# Patient Record
Sex: Female | Born: 1955
Health system: Southern US, Community
[De-identification: ages and names within clinical notes are randomized; demographics above are authoritative.]

## PROBLEM LIST (undated history)

## (undated) DIAGNOSIS — M25511 Pain in right shoulder: Secondary | ICD-10-CM

## (undated) DIAGNOSIS — M810 Age-related osteoporosis without current pathological fracture: Secondary | ICD-10-CM

## (undated) DIAGNOSIS — M8580 Other specified disorders of bone density and structure, unspecified site: Secondary | ICD-10-CM

## (undated) DIAGNOSIS — R002 Palpitations: Secondary | ICD-10-CM

## (undated) DIAGNOSIS — Z0000 Encounter for general adult medical examination without abnormal findings: Secondary | ICD-10-CM

## (undated) DIAGNOSIS — Z8619 Personal history of other infectious and parasitic diseases: Secondary | ICD-10-CM

## (undated) DIAGNOSIS — Z9889 Other specified postprocedural states: Secondary | ICD-10-CM

## (undated) DIAGNOSIS — K635 Polyp of colon: Secondary | ICD-10-CM

## (undated) DIAGNOSIS — F419 Anxiety disorder, unspecified: Secondary | ICD-10-CM

## (undated) DIAGNOSIS — R112 Nausea with vomiting, unspecified: Secondary | ICD-10-CM

## (undated) DIAGNOSIS — N809 Endometriosis, unspecified: Secondary | ICD-10-CM

## (undated) DIAGNOSIS — C44310 Basal cell carcinoma of skin of unspecified parts of face: Principal | ICD-10-CM

## (undated) DIAGNOSIS — D649 Anemia, unspecified: Secondary | ICD-10-CM

## (undated) HISTORY — DX: Anxiety disorder, unspecified: F41.9

## (undated) HISTORY — DX: Encounter for general adult medical examination without abnormal findings: Z00.00

## (undated) HISTORY — DX: Basal cell carcinoma of skin of unspecified parts of face: C44.310

## (undated) HISTORY — DX: Pain in right shoulder: M25.511

## (undated) HISTORY — PX: BASAL CELL CARCINOMA EXCISION: SHX1214

## (undated) HISTORY — PX: LIVER RESECTION: SHX1977

## (undated) HISTORY — DX: Endometriosis, unspecified: N80.9

## (undated) HISTORY — DX: Anemia, unspecified: D64.9

## (undated) HISTORY — DX: Age-related osteoporosis without current pathological fracture: M81.0

## (undated) HISTORY — DX: Personal history of other infectious and parasitic diseases: Z86.19

## (undated) HISTORY — PX: COLONOSCOPY: SHX174

## (undated) HISTORY — DX: Palpitations: R00.2

## (undated) HISTORY — DX: Other specified disorders of bone density and structure, unspecified site: M85.80

## (undated) HISTORY — DX: Polyp of colon: K63.5

---

## 1978-08-14 HISTORY — PX: CHOLECYSTECTOMY: SHX55

## 1979-08-15 DIAGNOSIS — K8020 Calculus of gallbladder without cholecystitis without obstruction: Secondary | ICD-10-CM

## 1979-08-15 HISTORY — DX: Calculus of gallbladder without cholecystitis without obstruction: K80.20

## 1983-08-15 HISTORY — PX: LAPAROSCOPY: SHX197

## 1998-11-15 ENCOUNTER — Other Ambulatory Visit: Admission: RE | Admit: 1998-11-15 | Discharge: 1998-11-15 | Payer: Self-pay | Admitting: Gynecology

## 2001-08-20 ENCOUNTER — Other Ambulatory Visit: Admission: RE | Admit: 2001-08-20 | Discharge: 2001-08-20 | Payer: Self-pay | Admitting: Gynecology

## 2002-12-24 ENCOUNTER — Other Ambulatory Visit: Admission: RE | Admit: 2002-12-24 | Discharge: 2002-12-24 | Payer: Self-pay | Admitting: Gynecology

## 2004-02-25 ENCOUNTER — Other Ambulatory Visit: Admission: RE | Admit: 2004-02-25 | Discharge: 2004-02-25 | Payer: Self-pay | Admitting: Gynecology

## 2005-05-17 ENCOUNTER — Other Ambulatory Visit: Admission: RE | Admit: 2005-05-17 | Discharge: 2005-05-17 | Payer: Self-pay | Admitting: Gynecology

## 2007-03-11 ENCOUNTER — Other Ambulatory Visit: Admission: RE | Admit: 2007-03-11 | Discharge: 2007-03-11 | Payer: Self-pay | Admitting: Gynecology

## 2012-01-23 ENCOUNTER — Encounter: Payer: Self-pay | Admitting: Internal Medicine

## 2012-02-13 ENCOUNTER — Other Ambulatory Visit: Payer: Self-pay | Admitting: Gynecology

## 2012-02-13 DIAGNOSIS — R928 Other abnormal and inconclusive findings on diagnostic imaging of breast: Secondary | ICD-10-CM

## 2012-02-22 ENCOUNTER — Ambulatory Visit
Admission: RE | Admit: 2012-02-22 | Discharge: 2012-02-22 | Disposition: A | Discharge status: Home / Self Care | Payer: BC Managed Care – PPO | Source: Ambulatory Visit | Attending: Gynecology | Admitting: Gynecology

## 2012-02-22 DIAGNOSIS — R928 Other abnormal and inconclusive findings on diagnostic imaging of breast: Secondary | ICD-10-CM

## 2012-03-06 ENCOUNTER — Encounter: Payer: Self-pay | Admitting: Internal Medicine

## 2012-07-03 ENCOUNTER — Encounter: Payer: Self-pay | Admitting: Internal Medicine

## 2012-07-24 ENCOUNTER — Encounter: Payer: BC Managed Care – PPO | Admitting: Internal Medicine

## 2013-06-18 ENCOUNTER — Encounter: Payer: Self-pay | Admitting: Internal Medicine

## 2013-07-15 IMAGING — MG MM DIGITAL DIAGNOSTIC UNILAT*R*
2 series · 2 of 2 positions shown · non-contrast
Comparison: 07/01/2008,  12/08/2009, 02/07/2012

CLINICAL DATA: Further evaluation of right asymmetry

DIGITAL DIAGNOSTIC RIGHT MAMMOGRAM

[R CC]
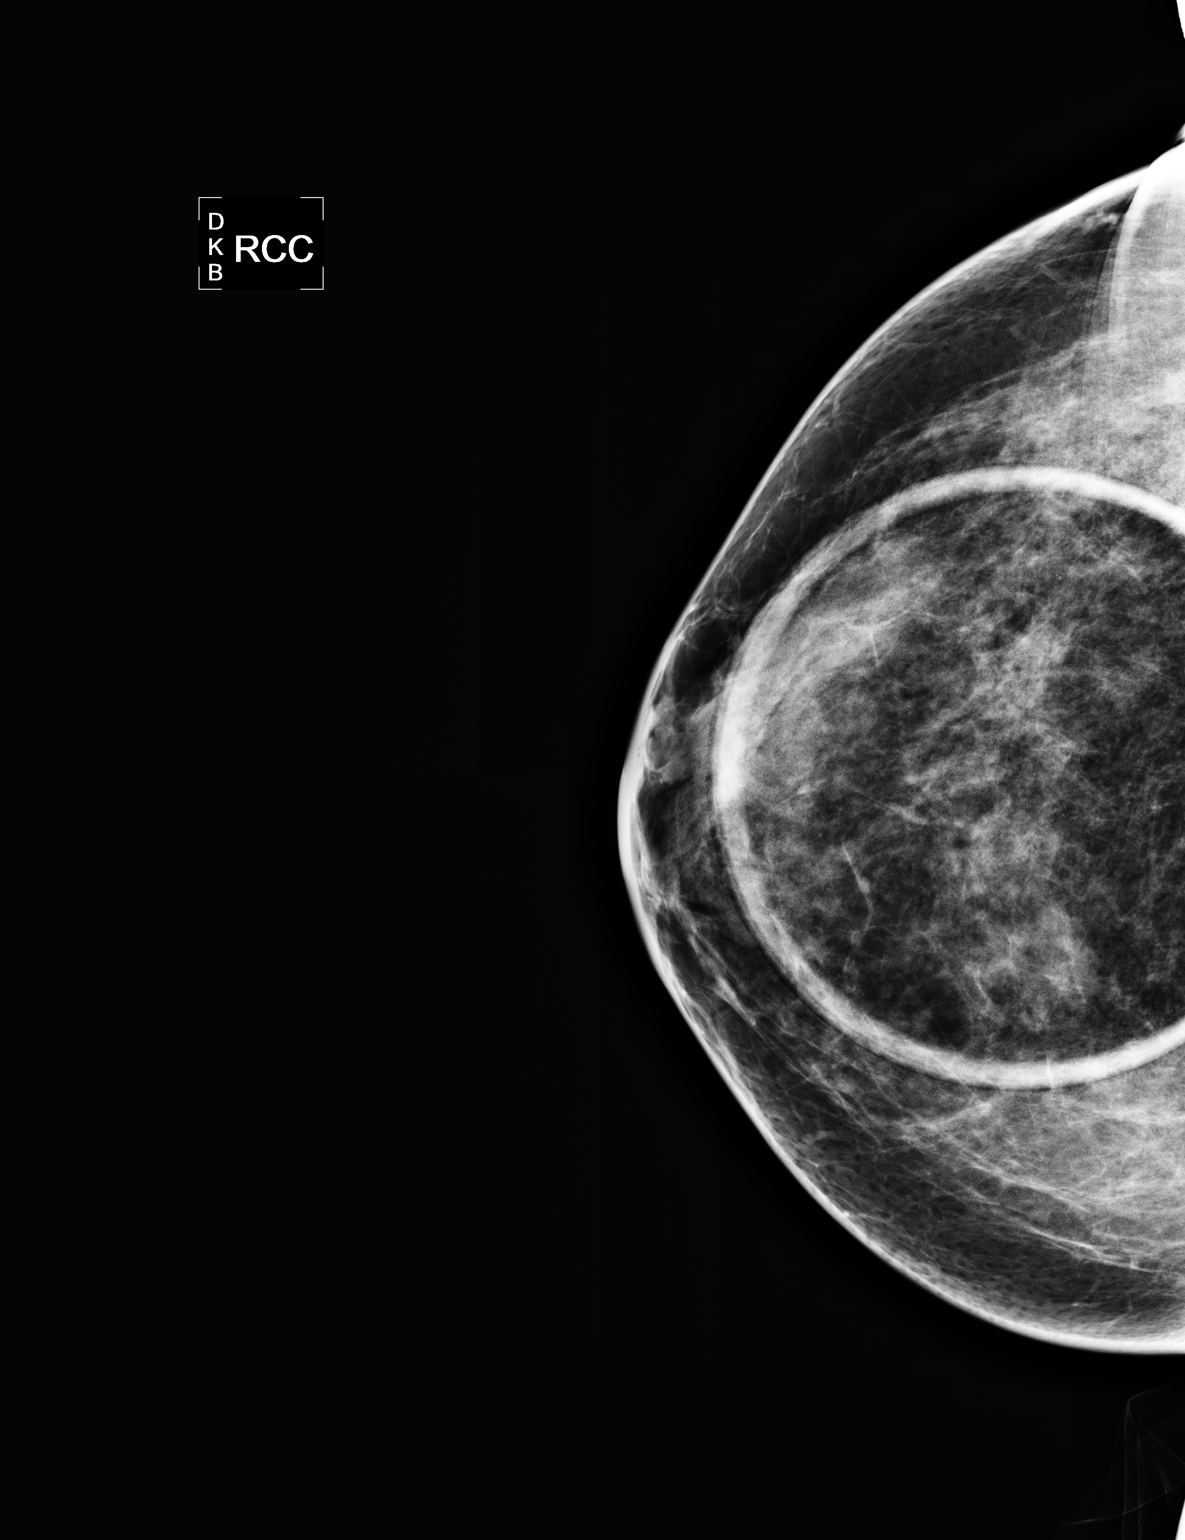

[R MLO]
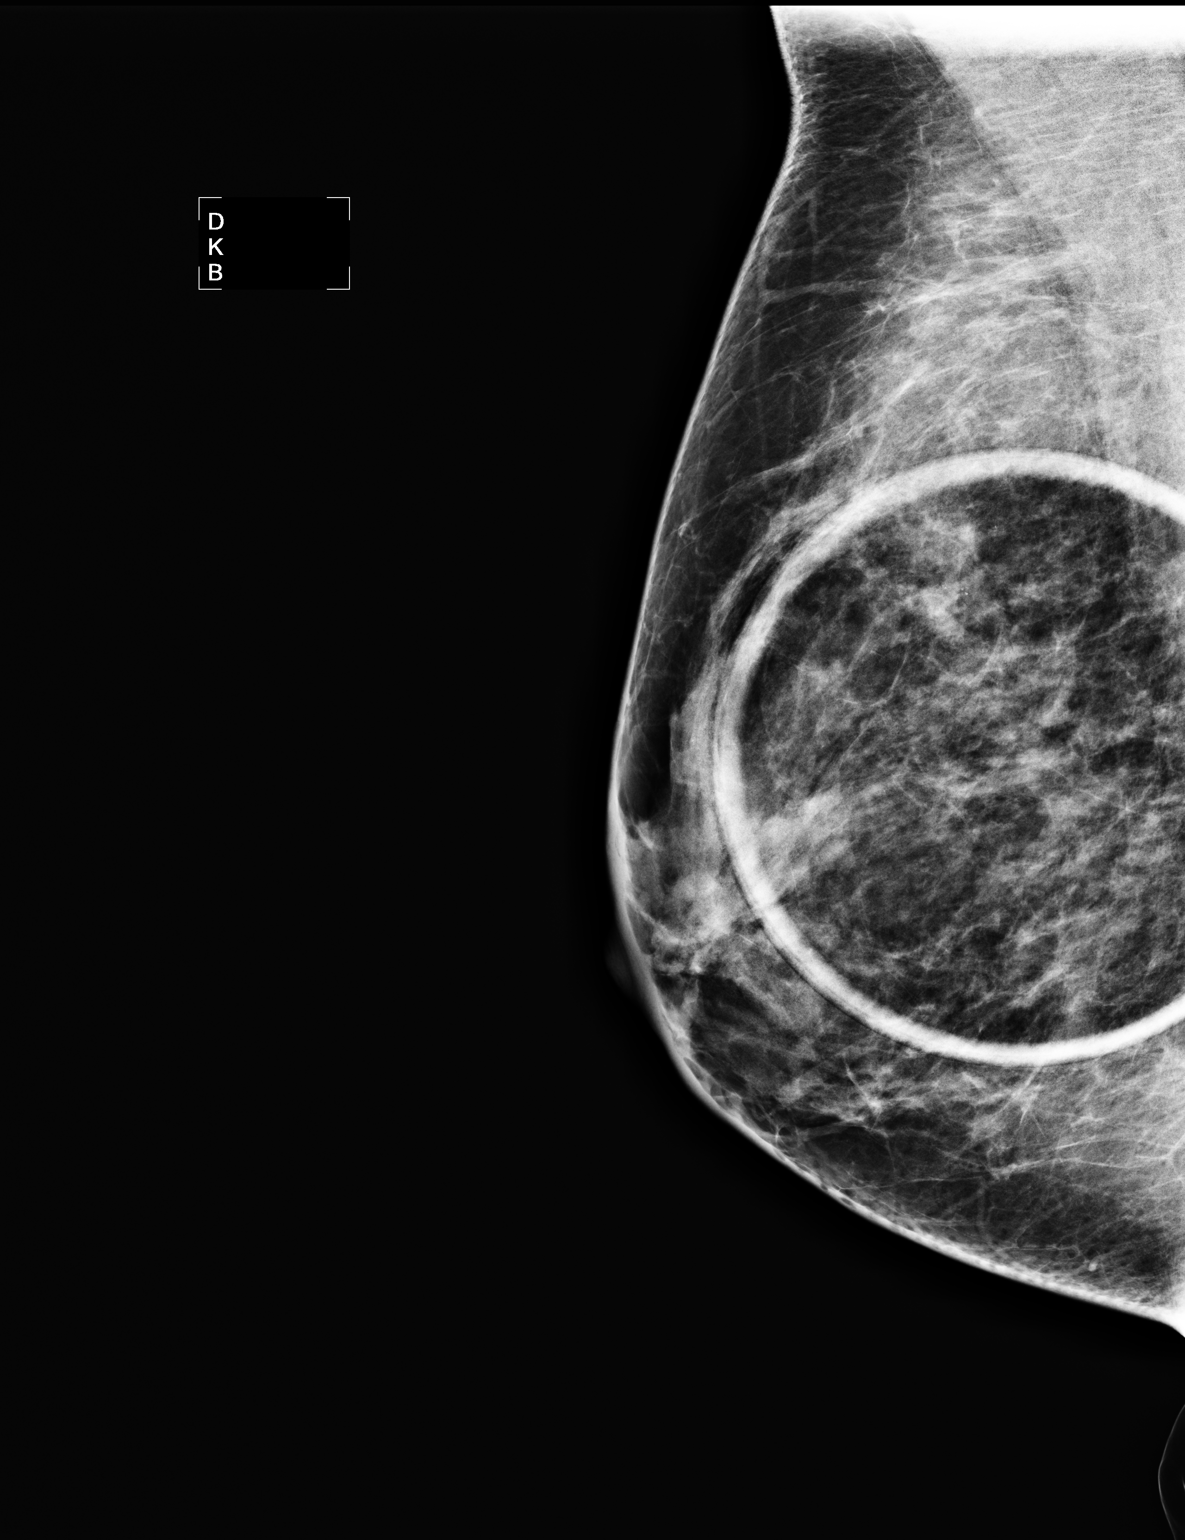

[2 of 2 positions shown; findings below may reference images not displayed]

FINDINGS: No persistent abnormalities on spot compression views.
Loosely clustered punctate calcifications upper outer quadrant
posteriorly seen incidentally, completely stable when compared to
all prior studies.  Normal
Mammographic images were processed with CAD.
IMPRESSION: No evidence of mass.  Stable benign calcifications.

BI-RADS CATEGORY 2:  Benign finding(s).

RECOMMENDATION:
Recommendation:

Return to annual screening mammography.

## 2013-08-27 ENCOUNTER — Encounter: Payer: Self-pay | Admitting: Internal Medicine

## 2013-08-27 ENCOUNTER — Ambulatory Visit (AMBULATORY_SURGERY_CENTER): Payer: Self-pay | Admitting: *Deleted

## 2013-08-27 VITALS — Ht 64.0 in | Wt 158.2 lb

## 2013-08-27 DIAGNOSIS — Z1211 Encounter for screening for malignant neoplasm of colon: Secondary | ICD-10-CM

## 2013-08-27 MED ORDER — MOVIPREP 100 G PO SOLR: 1.0000 | Freq: Once | ORAL | Status: DC

## 2013-08-27 NOTE — Progress Notes (Signed)
Denies allergies to eggs or soy products. Denies complications with sedation or anesthesia. 

## 2013-09-10 ENCOUNTER — Encounter: Payer: Self-pay | Admitting: Internal Medicine

## 2013-09-10 ENCOUNTER — Ambulatory Visit (AMBULATORY_SURGERY_CENTER): Payer: BC Managed Care – PPO | Admitting: Internal Medicine

## 2013-09-10 VITALS — BP 112/70 | HR 59 | Temp 96.0°F | Resp 13 | Ht 64.0 in | Wt 158.0 lb

## 2013-09-10 DIAGNOSIS — Z1211 Encounter for screening for malignant neoplasm of colon: Secondary | ICD-10-CM

## 2013-09-10 DIAGNOSIS — D126 Benign neoplasm of colon, unspecified: Secondary | ICD-10-CM

## 2013-09-10 MED ORDER — SODIUM CHLORIDE 0.9 % IV SOLN: 500.0000 mL | INTRAVENOUS | Status: DC

## 2013-09-10 NOTE — Patient Instructions (Signed)
YOU HAD AN ENDOSCOPIC PROCEDURE TODAY AT THE [facility]: Refer to the procedure report that was given to you for any specific questions about what was found during the examination.  If the procedure report does not answer your questions, please call your gastroenterologist to clarify.  If you requested that your care partner not be given the details of your procedure findings, then the procedure report has been included in a sealed envelope for you to review at your convenience later.  YOU SHOULD EXPECT: Some feelings of bloating in the abdomen. Passage of more gas than usual.  Walking can help get rid of the air that was put into your GI tract during the procedure and reduce the bloating. If you had a lower endoscopy (such as a colonoscopy or flexible sigmoidoscopy) you may notice spotting of blood in your stool or on the toilet paper. If you underwent a bowel prep for your procedure, then you may not have a normal bowel movement for a few days.  DIET: Your first meal following the procedure should be a light meal and then it is ok to progress to your normal diet.  A half-sandwich or bowl of soup is an example of a good first meal.  Heavy or fried foods are harder to digest and may make you feel nauseous or bloated.  Likewise meals heavy in dairy and vegetables can cause extra gas to form and this can also increase the bloating.  Drink plenty of fluids but you should avoid alcoholic beverages for 24 hours.  ACTIVITY: Your care partner should take you home directly after the procedure.  You should plan to take it easy, moving slowly for the rest of the day.  You can resume normal activity the day after the procedure however you should NOT DRIVE or use heavy machinery for 24 hours (because of the sedation medicines used during the test).    SYMPTOMS TO REPORT IMMEDIATELY: A gastroenterologist can be reached at any hour.  During normal business hours, 8:30 AM to 5:00 PM Monday through Friday,  call [phone].  After hours and on weekends, please call the GI answering service at [phone] who will take a message and have the physician on call contact you.   Following lower endoscopy (colonoscopy or flexible sigmoidoscopy):  Excessive amounts of blood in the stool  Significant tenderness or worsening of abdominal pains  Swelling of the abdomen that is new, acute  Fever of 100F or higher    FOLLOW UP: If any biopsies were taken you will be contacted by phone or by letter within the next 1-3 weeks.  Call your gastroenterologist if you have not heard about the biopsies in 3 weeks.  Our staff will call the home number listed on your records the next business day following your procedure to check on you and address any questions or concerns that you may have at that time regarding the information given to you following your procedure. This is a courtesy call and so if there is no answer at the home number and we have not heard from you through the emergency physician on call, we will assume that you have returned to your regular daily activities without incident.  SIGNATURES/CONFIDENTIALITY: You and/or your care partner have signed paperwork which will be entered into your electronic medical record.  These signatures attest to the fact that that the information above on your After Visit Summary has been reviewed and is understood.  Full responsibility of the confidentiality   of this discharge information lies with you and/or your care-partner.  Polyp, diverticulosis, and high fiber diet information given.  Dr. Olevia Perches will advise you about timing of next colonoscopy after pathology reports are reviewed.

## 2013-09-10 NOTE — Progress Notes (Signed)
Stable to RR 

## 2013-09-10 NOTE — Progress Notes (Signed)
Called to room to assist during endoscopic procedure.  Patient ID and intended procedure confirmed with present staff. Received instructions for my participation in the procedure from the performing physician.  

## 2013-09-10 NOTE — Op Note (Signed)
[facility]  [address] [place], [id number]   COLONOSCOPY PROCEDURE REPORT  PATIENT: Joan Owen, Joan Owen  MR#: [id number] BIRTHDATE: 09-Jul-1956 , 57  yrs. old GENDER: Female ENDOSCOPIST: Lafayette Dragon, MD REFERRED IO:NGEXBM Mezer, M.D. PROCEDURE DATE:  09/10/2013 PROCEDURE:   Colonoscopy with snare polypectomy First Screening Colonoscopy - Avg.  risk and is 50 yrs.  old or older Yes.  Prior Negative Screening - Now for repeat screening. N/A  History of Adenoma - Now for follow-up colonoscopy & has been > or = to 3 yrs.  N/A  Polyps Removed Today? Yes. ASA CLASS:   Class I INDICATIONS:Average risk patient for colon cancer. MEDICATIONS: propofol (Diprivan) 350mg  IV  DESCRIPTION OF PROCEDURE:   After the risks benefits and alternatives of the procedure were thoroughly explained, informed consent was obtained.  A digital rectal exam revealed no abnormalities of the rectum.   The LB [id number]  endoscope was introduced through the anus and advanced to the cecum, which was identified by both the appendix and ileocecal valve. No adverse events experienced.   The quality of the prep was excellent, using MoviPrep  The instrument was then slowly withdrawn as the colon was fully examined.      COLON FINDINGS: A polypoid shaped sessile polyp ranging between 3-60mm in size was found in the sigmoid colon.  A polypectomy was performed with a cold snare.  The resection was complete and the polyp tissue was completely retrieved.  Retroflexed views revealed no abnormalities. The time to cecum=6 minutes 39 seconds. Withdrawal time=10 minutes 46 seconds.  The scope was withdrawn and the procedure completed. COMPLICATIONS: There were no complications.  ENDOSCOPIC IMPRESSION: Sessile polyp ranging between 3-78mm in size was found in the sigmoid colon; polypectomy was performed with a cold snare mild diverticulosis of the sigmoid colon RECOMMENDATIONS: 1.  Await pathology  results 2.  high-fiber diet Recall colonoscopy pending path report   eSigned:  Lafayette Dragon, MD 09/10/2013 9:58 AM   cc:   PATIENT NAME:  Joan Owen, Joan Owen MR#: [id number]

## 2013-09-11 ENCOUNTER — Telehealth: Payer: Self-pay | Admitting: *Deleted

## 2013-09-11 NOTE — Telephone Encounter (Signed)
  Follow up Call-  Call back number 09/10/2013  Post procedure Call Back phone  # [phone]  Permission to leave phone message Yes     Patient questions:  Do you have a fever, pain , or abdominal swelling? no Pain Score  0 *  Have you tolerated food without any problems? yes  Have you been able to return to your normal activities? yes  Do you have any questions about your discharge instructions: Diet   no Medications  no Follow up visit  no  Do you have questions or concerns about your Care? no  Actions: * If pain score is 4 or above: No action needed, pain <4.

## 2013-09-16 ENCOUNTER — Encounter: Payer: Self-pay | Admitting: Internal Medicine

## 2013-09-17 ENCOUNTER — Encounter: Payer: Self-pay | Admitting: *Deleted

## 2014-10-21 ENCOUNTER — Other Ambulatory Visit: Payer: Self-pay | Admitting: Gynecology

## 2014-10-22 LAB — CYTOLOGY - PAP

## 2015-01-05 ENCOUNTER — Ambulatory Visit (INDEPENDENT_AMBULATORY_CARE_PROVIDER_SITE_OTHER): Payer: BLUE CROSS/BLUE SHIELD | Admitting: Family Medicine

## 2015-01-05 ENCOUNTER — Encounter: Payer: Self-pay | Admitting: Family Medicine

## 2015-01-05 VITALS — BP 108/70 | HR 71 | Temp 98.1°F | Resp 16 | Ht 65.0 in | Wt 153.0 lb

## 2015-01-05 DIAGNOSIS — Z23 Encounter for immunization: Secondary | ICD-10-CM | POA: Diagnosis not present

## 2015-01-05 DIAGNOSIS — M8580 Other specified disorders of bone density and structure, unspecified site: Secondary | ICD-10-CM

## 2015-01-05 DIAGNOSIS — Z0000 Encounter for general adult medical examination without abnormal findings: Secondary | ICD-10-CM

## 2015-01-05 DIAGNOSIS — Z8619 Personal history of other infectious and parasitic diseases: Secondary | ICD-10-CM | POA: Insufficient documentation

## 2015-01-05 DIAGNOSIS — M25511 Pain in right shoulder: Secondary | ICD-10-CM | POA: Diagnosis not present

## 2015-01-05 DIAGNOSIS — C44310 Basal cell carcinoma of skin of unspecified parts of face: Secondary | ICD-10-CM

## 2015-01-05 DIAGNOSIS — R002 Palpitations: Secondary | ICD-10-CM

## 2015-01-05 HISTORY — DX: Palpitations: R00.2

## 2015-01-05 HISTORY — DX: Other specified disorders of bone density and structure, unspecified site: M85.80

## 2015-01-05 HISTORY — DX: Encounter for general adult medical examination without abnormal findings: Z00.00

## 2015-01-05 HISTORY — DX: Basal cell carcinoma of skin of unspecified parts of face: C44.310

## 2015-01-05 HISTORY — DX: Pain in right shoulder: M25.511

## 2015-01-05 LAB — COMPREHENSIVE METABOLIC PANEL
ALBUMIN: 4.3 g/dL (ref 3.5–5.2)
ALT: 14 U/L (ref 0–35)
AST: 16 U/L (ref 0–37)
Alkaline Phosphatase: 68 U/L (ref 39–117)
BILIRUBIN TOTAL: 0.7 mg/dL (ref 0.2–1.2)
BUN: 14 mg/dL (ref 6–23)
CHLORIDE: 104 meq/L (ref 96–112)
CO2: 32 mEq/L (ref 19–32)
Calcium: 9.3 mg/dL (ref 8.4–10.5)
Creatinine, Ser: 0.78 mg/dL (ref 0.40–1.20)
GFR: 80.72 mL/min (ref >60.00)
Glucose, Bld: 97 mg/dL (ref 70–99)
POTASSIUM: 4 meq/L (ref 3.5–5.1)
Sodium: 139 mEq/L (ref 135–145)
Total Protein: 7.1 g/dL (ref 6.0–8.3)

## 2015-01-05 LAB — LIPID PANEL
CHOL/HDL RATIO: 4
CHOLESTEROL: 176 mg/dL (ref 0–200)
HDL: 47.9 mg/dL (ref >39.00)
LDL CALC: 89 mg/dL (ref 0–99)
NonHDL: 128.1
Triglycerides: 196 mg/dL — ABNORMAL HIGH (ref 0.0–149.0)
VLDL: 39.2 mg/dL (ref 0.0–40.0)

## 2015-01-05 LAB — CBC
HEMATOCRIT: 37.8 % (ref 36.0–46.0)
HEMOGLOBIN: 12.8 g/dL (ref 12.0–15.0)
MCHC: 33.8 g/dL (ref 30.0–36.0)
MCV: 90.8 fl (ref 78.0–100.0)
PLATELETS: 215 10*3/uL (ref 150.0–400.0)
RBC: 4.16 Mil/uL (ref 3.87–5.11)
RDW: 13.6 % (ref 11.5–15.5)
WBC: 4.4 10*3/uL (ref 4.0–10.5)

## 2015-01-05 LAB — TSH: TSH: 0.78 u[IU]/mL (ref 0.35–4.50)

## 2015-01-05 NOTE — Progress Notes (Signed)
Pre visit review using our clinic review tool, if applicable. No additional management support is needed unless otherwise documented below in the visit note. 

## 2015-01-05 NOTE — Patient Instructions (Addendum)
Salon Pas or Aspercreme gel to shoulder and or patches. After applying moist heat to shoulder for 10-15 minutes. Call if shoulder pain worsens and you want a referral to Sports Med or ortho Calcium Citrate 600 mg once daily, Vit D 3 2000 IU daily 2 servings of calcium a day in diet, 1500 mg a day  Rel of Rec Dr Carren Rang last 2 dexa scans and [facility]    Preventive Care for Adults A healthy lifestyle and preventive care can promote health and wellness. Preventive health guidelines for women include the following key practices.  A routine yearly physical is a good way to check with your health care provider about your health and preventive screening. It is a chance to share any concerns and updates on your health and to receive a thorough exam.  Visit your dentist for a routine exam and preventive care every 6 months. Brush your teeth twice a day and floss once a day. Good oral hygiene prevents tooth decay and gum disease.  The frequency of eye exams is based on your age, health, family medical history, use of contact lenses, and other factors. Follow your health care provider's recommendations for frequency of eye exams.  Eat a healthy diet. Foods like vegetables, fruits, whole grains, low-fat dairy products, and lean protein foods contain the nutrients you need without too many calories. Decrease your intake of foods high in solid fats, added sugars, and salt. Eat the right amount of calories for you.Get information about a proper diet from your health care provider, if necessary.  Regular physical exercise is one of the most important things you can do for your health. Most adults should get at least 150 minutes of moderate-intensity exercise (any activity that increases your heart rate and causes you to sweat) each week. In addition, most adults need muscle-strengthening exercises on 2 or more days a week.  Maintain a healthy weight. The body mass index (BMI) is a screening tool to identify possible  weight problems. It provides an estimate of body fat based on height and weight. Your health care provider can find your BMI and can help you achieve or maintain a healthy weight.For adults 20 years and older:  A BMI below 18.5 is considered underweight.  A BMI of 18.5 to 24.9 is normal.  A BMI of 25 to 29.9 is considered overweight.  A BMI of 30 and above is considered obese.  Maintain normal blood lipids and cholesterol levels by exercising and minimizing your intake of saturated fat. Eat a balanced diet with plenty of fruit and vegetables. Blood tests for lipids and cholesterol should begin at age 39 and be repeated every 5 years. If your lipid or cholesterol levels are high, you are over 50, or you are at high risk for heart disease, you may need your cholesterol levels checked more frequently.Ongoing high lipid and cholesterol levels should be treated with medicines if diet and exercise are not working.  If you smoke, find out from your health care provider how to quit. If you do not use tobacco, do not start.  Lung cancer screening is recommended for adults aged 11-80 years who are at high risk for developing lung cancer because of a history of smoking. A yearly low-dose CT scan of the lungs is recommended for people who have at least a 30-pack-year history of smoking and are a current smoker or have quit within the past 15 years. A pack year of smoking is smoking an average of 1 pack  of cigarettes a day for 1 year (for example: 1 pack a day for 30 years or 2 packs a day for 15 years). Yearly screening should continue until the smoker has stopped smoking for at least 15 years. Yearly screening should be stopped for people who develop a health problem that would prevent them from having lung cancer treatment.  If you are pregnant, do not drink alcohol. If you are breastfeeding, be very cautious about drinking alcohol. If you are not pregnant and choose to drink alcohol, do not have more than 1  drink per day. One drink is considered to be 12 ounces (355 mL) of beer, 5 ounces (148 mL) of wine, or 1.5 ounces (44 mL) of liquor.  Avoid use of street drugs. Do not share needles with anyone. Ask for help if you need support or instructions about stopping the use of drugs.  High blood pressure causes heart disease and increases the risk of stroke. Your blood pressure should be checked at least every 1 to 2 years. Ongoing high blood pressure should be treated with medicines if weight loss and exercise do not work.  If you are 5-16 years old, ask your health care provider if you should take aspirin to prevent strokes.  Diabetes screening involves taking a blood sample to check your fasting blood sugar level. This should be done once every 3 years, after age 25, if you are within normal weight and without risk factors for diabetes. Testing should be considered at a younger age or be carried out more frequently if you are overweight and have at least 1 risk factor for diabetes.  Breast cancer screening is essential preventive care for women. You should practice "breast self-awareness." This means understanding the normal appearance and feel of your breasts and may include breast self-examination. Any changes detected, no matter how small, should be reported to a health care provider. Women in their 72s and 30s should have a clinical breast exam (CBE) by a health care provider as part of a regular health exam every 1 to 3 years. After age 54, women should have a CBE every year. Starting at age 25, women should consider having a mammogram (breast X-ray test) every year. Women who have a family history of breast cancer should talk to their health care provider about genetic screening. Women at a high risk of breast cancer should talk to their health care providers about having an MRI and a mammogram every year.  Breast cancer gene (BRCA)-related cancer risk assessment is recommended for women who have  family members with BRCA-related cancers. BRCA-related cancers include breast, ovarian, tubal, and peritoneal cancers. Having family members with these cancers may be associated with an increased risk for harmful changes (mutations) in the breast cancer genes BRCA1 and BRCA2. Results of the assessment will determine the need for genetic counseling and BRCA1 and BRCA2 testing.  Routine pelvic exams to screen for cancer are no longer recommended for nonpregnant women who are considered low risk for cancer of the pelvic organs (ovaries, uterus, and vagina) and who do not have symptoms. Ask your health care provider if a screening pelvic exam is right for you.  If you have had past treatment for cervical cancer or a condition that could lead to cancer, you need Pap tests and screening for cancer for at least 20 years after your treatment. If Pap tests have been discontinued, your risk factors (such as having a new sexual partner) need to be reassessed to determine if  screening should be resumed. Some women have medical problems that increase the chance of getting cervical cancer. In these cases, your health care provider may recommend more frequent screening and Pap tests.  The HPV test is an additional test that may be used for cervical cancer screening. The HPV test looks for the virus that can cause the cell changes on the cervix. The cells collected during the Pap test can be tested for HPV. The HPV test could be used to screen women aged 70 years and older, and should be used in women of any age who have unclear Pap test results. After the age of 4, women should have HPV testing at the same frequency as a Pap test.  Colorectal cancer can be detected and often prevented. Most routine colorectal cancer screening begins at the age of 17 years and continues through age 10 years. However, your health care provider may recommend screening at an earlier age if you have risk factors for colon cancer. On a yearly  basis, your health care provider may provide home test kits to check for hidden blood in the stool. Use of a small camera at the end of a tube, to directly examine the colon (sigmoidoscopy or colonoscopy), can detect the earliest forms of colorectal cancer. Talk to your health care provider about this at age 40, when routine screening begins. Direct exam of the colon should be repeated every 5-10 years through age 24 years, unless early forms of pre-cancerous polyps or small growths are found.  People who are at an increased risk for hepatitis B should be screened for this virus. You are considered at high risk for hepatitis B if:  You were born in a country where hepatitis B occurs often. Talk with your health care provider about which countries are considered high risk.  Your parents were born in a high-risk country and you have not received a shot to protect against hepatitis B (hepatitis B vaccine).  You have HIV or AIDS.  You use needles to inject street drugs.  You live with, or have sex with, someone who has hepatitis B.  You get hemodialysis treatment.  You take certain medicines for conditions like cancer, organ transplantation, and autoimmune conditions.  Hepatitis C blood testing is recommended for all people born from [birth year] through 1965 and any individual with known risks for hepatitis C.  Practice safe sex. Use condoms and avoid high-risk sexual practices to reduce the spread of sexually transmitted infections (STIs). STIs include gonorrhea, chlamydia, syphilis, trichomonas, herpes, HPV, and human immunodeficiency virus (HIV). Herpes, HIV, and HPV are viral illnesses that have no cure. They can result in disability, cancer, and death.  You should be screened for sexually transmitted illnesses (STIs) including gonorrhea and chlamydia if:  You are sexually active and are younger than 24 years.  You are older than 24 years and your health care provider tells you that you are at  risk for this type of infection.  Your sexual activity has changed since you were last screened and you are at an increased risk for chlamydia or gonorrhea. Ask your health care provider if you are at risk.  If you are at risk of being infected with HIV, it is recommended that you take a prescription medicine daily to prevent HIV infection. This is called preexposure prophylaxis (PrEP). You are considered at risk if:  You are a heterosexual woman, are sexually active, and are at increased risk for HIV infection.  You take drugs by  injection.  You are sexually active with a partner who has HIV.  Talk with your health care provider about whether you are at high risk of being infected with HIV. If you choose to begin PrEP, you should first be tested for HIV. You should then be tested every 3 months for as long as you are taking PrEP.  Osteoporosis is a disease in which the bones lose minerals and strength with aging. This can result in serious bone fractures or breaks. The risk of osteoporosis can be identified using a bone density scan. Women ages 40 years and over and women at risk for fractures or osteoporosis should discuss screening with their health care providers. Ask your health care provider whether you should take a calcium supplement or vitamin D to reduce the rate of osteoporosis.  Menopause can be associated with physical symptoms and risks. Hormone replacement therapy is available to decrease symptoms and risks. You should talk to your health care provider about whether hormone replacement therapy is right for you.  Use sunscreen. Apply sunscreen liberally and repeatedly throughout the day. You should seek shade when your shadow is shorter than you. Protect yourself by wearing long sleeves, pants, a wide-brimmed hat, and sunglasses year round, whenever you are outdoors.  Once a month, do a whole body skin exam, using a mirror to look at the skin on your back. Tell your health care  provider of new moles, moles that have irregular borders, moles that are larger than a pencil eraser, or moles that have changed in shape or color.  Stay current with required vaccines (immunizations).  Influenza vaccine. All adults should be immunized every year.  Tetanus, diphtheria, and acellular pertussis (Td, Tdap) vaccine. Pregnant women should receive 1 dose of Tdap vaccine during each pregnancy. The dose should be obtained regardless of the length of time since the last dose. Immunization is preferred during the 27th-36th week of gestation. An adult who has not previously received Tdap or who does not know her vaccine status should receive 1 dose of Tdap. This initial dose should be followed by tetanus and diphtheria toxoids (Td) booster doses every 10 years. Adults with an unknown or incomplete history of completing a 3-dose immunization series with Td-containing vaccines should begin or complete a primary immunization series including a Tdap dose. Adults should receive a Td booster every 10 years.  Varicella vaccine. An adult without evidence of immunity to varicella should receive 2 doses or a second dose if she has previously received 1 dose. Pregnant females who do not have evidence of immunity should receive the first dose after pregnancy. This first dose should be obtained before leaving the health care facility. The second dose should be obtained 4-8 weeks after the first dose.  Human papillomavirus (HPV) vaccine. Females aged 13-26 years who have not received the vaccine previously should obtain the 3-dose series. The vaccine is not recommended for use in pregnant females. However, pregnancy testing is not needed before receiving a dose. If a female is found to be pregnant after receiving a dose, no treatment is needed. In that case, the remaining doses should be delayed until after the pregnancy. Immunization is recommended for any person with an immunocompromised condition through the  age of 14 years if she did not get any or all doses earlier. During the 3-dose series, the second dose should be obtained 4-8 weeks after the first dose. The third dose should be obtained 24 weeks after the first dose and 16 weeks  after the second dose.  Zoster vaccine. One dose is recommended for adults aged 65 years or older unless certain conditions are present.  Measles, mumps, and rubella (MMR) vaccine. Adults born before [birth year] generally are considered immune to measles and mumps. Adults born in [birth year] or later should have 1 or more doses of MMR vaccine unless there is a contraindication to the vaccine or there is laboratory evidence of immunity to each of the three diseases. A routine second dose of MMR vaccine should be obtained at least 28 days after the first dose for students attending postsecondary schools, health care workers, or international travelers. People who received inactivated measles vaccine or an unknown type of measles vaccine during 1963-1967 should receive 2 doses of MMR vaccine. People who received inactivated mumps vaccine or an unknown type of mumps vaccine before 1979 and are at high risk for mumps infection should consider immunization with 2 doses of MMR vaccine. For females of childbearing age, rubella immunity should be determined. If there is no evidence of immunity, females who are not pregnant should be vaccinated. If there is no evidence of immunity, females who are pregnant should delay immunization until after pregnancy. Unvaccinated health care workers born before [birth year] who lack laboratory evidence of measles, mumps, or rubella immunity or laboratory confirmation of disease should consider measles and mumps immunization with 2 doses of MMR vaccine or rubella immunization with 1 dose of MMR vaccine.  Pneumococcal 13-valent conjugate (PCV13) vaccine. When indicated, a person who is uncertain of her immunization history and has no record of immunization should receive the  PCV13 vaccine. An adult aged 79 years or older who has certain medical conditions and has not been previously immunized should receive 1 dose of PCV13 vaccine. This PCV13 should be followed with a dose of pneumococcal polysaccharide (PPSV23) vaccine. The PPSV23 vaccine dose should be obtained at least 8 weeks after the dose of PCV13 vaccine. An adult aged 71 years or older who has certain medical conditions and previously received 1 or more doses of PPSV23 vaccine should receive 1 dose of PCV13. The PCV13 vaccine dose should be obtained 1 or more years after the last PPSV23 vaccine dose.  Pneumococcal polysaccharide (PPSV23) vaccine. When PCV13 is also indicated, PCV13 should be obtained first. All adults aged 19 years and older should be immunized. An adult younger than age 79 years who has certain medical conditions should be immunized. Any person who resides in a nursing home or long-term care facility should be immunized. An adult smoker should be immunized. People with an immunocompromised condition and certain other conditions should receive both PCV13 and PPSV23 vaccines. People with human immunodeficiency virus (HIV) infection should be immunized as soon as possible after diagnosis. Immunization during chemotherapy or radiation therapy should be avoided. Routine use of PPSV23 vaccine is not recommended for American Indians, [place] Natives, or people younger than 65 years unless there are medical conditions that require PPSV23 vaccine. When indicated, people who have unknown immunization and have no record of immunization should receive PPSV23 vaccine. One-time revaccination 5 years after the first dose of PPSV23 is recommended for people aged 19-64 years who have chronic kidney failure, nephrotic syndrome, asplenia, or immunocompromised conditions. People who received 1-2 doses of PPSV23 before age 41 years should receive another dose of PPSV23 vaccine at age 8 years or later if at least 5 years have  passed since the previous dose. Doses of PPSV23 are not needed for people immunized with PPSV23 at or after  age 53 years.  Meningococcal vaccine. Adults with asplenia or persistent complement component deficiencies should receive 2 doses of quadrivalent meningococcal conjugate (MenACWY-D) vaccine. The doses should be obtained at least 2 months apart. Microbiologists working with certain meningococcal bacteria, [employer] recruits, people at risk during an outbreak, and people who travel to or live in countries with a high rate of meningitis should be immunized. A first-year college student up through age 25 years who is living in a residence hall should receive a dose if she did not receive a dose on or after her 16th birthday. Adults who have certain high-risk conditions should receive one or more doses of vaccine.  Hepatitis A vaccine. Adults who wish to be protected from this disease, have certain high-risk conditions, work with hepatitis A-infected animals, work in hepatitis A research labs, or travel to or work in countries with a high rate of hepatitis A should be immunized. Adults who were previously unvaccinated and who anticipate close contact with an international adoptee during the first 60 days after arrival in the [place] from a country with a high rate of hepatitis A should be immunized.  Hepatitis B vaccine. Adults who wish to be protected from this disease, have certain high-risk conditions, may be exposed to blood or other infectious body fluids, are household contacts or sex partners of hepatitis B positive people, are clients or workers in certain care facilities, or travel to or work in countries with a high rate of hepatitis B should be immunized.  Haemophilus influenzae type b (Hib) vaccine. A previously unvaccinated person with asplenia or sickle cell disease or having a scheduled splenectomy should receive 1 dose of Hib vaccine. Regardless of previous immunization, a recipient of  a hematopoietic stem cell transplant should receive a 3-dose series 6-12 months after her successful transplant. Hib vaccine is not recommended for adults with HIV infection. Preventive Services / Frequency Ages 85 to 29 years  Blood pressure check.** / Every 1 to 2 years.  Lipid and cholesterol check.** / Every 5 years beginning at age 42.  Clinical breast exam.** / Every 3 years for women in their 73s and 19s.  BRCA-related cancer risk assessment.** / For women who have family members with a BRCA-related cancer (breast, ovarian, tubal, or peritoneal cancers).  Pap test.** / Every 2 years from ages 45 through 1. Every 3 years starting at age 76 through age 67 or 14 with a history of 3 consecutive normal Pap tests.  HPV screening.** / Every 3 years from ages 41 through ages 8 to 59 with a history of 3 consecutive normal Pap tests.  Hepatitis C blood test.** / For any individual with known risks for hepatitis C.  Skin self-exam. / Monthly.  Influenza vaccine. / Every year.  Tetanus, diphtheria, and acellular pertussis (Tdap, Td) vaccine.** / Consult your health care provider. Pregnant women should receive 1 dose of Tdap vaccine during each pregnancy. 1 dose of Td every 10 years.  Varicella vaccine.** / Consult your health care provider. Pregnant females who do not have evidence of immunity should receive the first dose after pregnancy.  HPV vaccine. / 3 doses over 6 months, if 48 and younger. The vaccine is not recommended for use in pregnant females. However, pregnancy testing is not needed before receiving a dose.  Measles, mumps, rubella (MMR) vaccine.** / You need at least 1 dose of MMR if you were born in 1957 or later. You may also need a 2nd dose. For females of childbearing  age, rubella immunity should be determined. If there is no evidence of immunity, females who are not pregnant should be vaccinated. If there is no evidence of immunity, females who are pregnant should delay  immunization until after pregnancy.  Pneumococcal 13-valent conjugate (PCV13) vaccine.** / Consult your health care provider.  Pneumococcal polysaccharide (PPSV23) vaccine.** / 1 to 2 doses if you smoke cigarettes or if you have certain conditions.  Meningococcal vaccine.** / 1 dose if you are age 64 to 50 years and a Market researcher living in a residence hall, or have one of several medical conditions, you need to get vaccinated against meningococcal disease. You may also need additional booster doses.  Hepatitis A vaccine.** / Consult your health care provider.  Hepatitis B vaccine.** / Consult your health care provider.  Haemophilus influenzae type b (Hib) vaccine.** / Consult your health care provider. Ages 78 to 29 years  Blood pressure check.** / Every 1 to 2 years.  Lipid and cholesterol check.** / Every 5 years beginning at age 44 years.  Lung cancer screening. / Every year if you are aged 53-80 years and have a 30-pack-year history of smoking and currently smoke or have quit within the past 15 years. Yearly screening is stopped once you have quit smoking for at least 15 years or develop a health problem that would prevent you from having lung cancer treatment.  Clinical breast exam.** / Every year after age 71 years.  BRCA-related cancer risk assessment.** / For women who have family members with a BRCA-related cancer (breast, ovarian, tubal, or peritoneal cancers).  Mammogram.** / Every year beginning at age 53 years and continuing for as long as you are in good health. Consult with your health care provider.  Pap test.** / Every 3 years starting at age 54 years through age 90 or 77 years with a history of 3 consecutive normal Pap tests.  HPV screening.** / Every 3 years from ages 19 years through ages 90 to 87 years with a history of 3 consecutive normal Pap tests.  Fecal occult blood test (FOBT) of stool. / Every year beginning at age 51 years and continuing  until age 1 years. You may not need to do this test if you get a colonoscopy every 10 years.  Flexible sigmoidoscopy or colonoscopy.** / Every 5 years for a flexible sigmoidoscopy or every 10 years for a colonoscopy beginning at age 46 years and continuing until age 49 years.  Hepatitis C blood test.** / For all people born from [birth year] through 1964 and any individual with known risks for hepatitis C.  Skin self-exam. / Monthly.  Influenza vaccine. / Every year.  Tetanus, diphtheria, and acellular pertussis (Tdap/Td) vaccine.** / Consult your health care provider. Pregnant women should receive 1 dose of Tdap vaccine during each pregnancy. 1 dose of Td every 10 years.  Varicella vaccine.** / Consult your health care provider. Pregnant females who do not have evidence of immunity should receive the first dose after pregnancy.  Zoster vaccine.** / 1 dose for adults aged 95 years or older.  Measles, mumps, rubella (MMR) vaccine.** / You need at least 1 dose of MMR if you were born in 1957 or later. You may also need a 2nd dose. For females of childbearing age, rubella immunity should be determined. If there is no evidence of immunity, females who are not pregnant should be vaccinated. If there is no evidence of immunity, females who are pregnant should delay immunization until after pregnancy.  Pneumococcal  13-valent conjugate (PCV13) vaccine.** / Consult your health care provider.  Pneumococcal polysaccharide (PPSV23) vaccine.** / 1 to 2 doses if you smoke cigarettes or if you have certain conditions.  Meningococcal vaccine.** / Consult your health care provider.  Hepatitis A vaccine.** / Consult your health care provider.  Hepatitis B vaccine.** / Consult your health care provider.  Haemophilus influenzae type b (Hib) vaccine.** / Consult your health care provider. Ages 57 years and over  Blood pressure check.** / Every 1 to 2 years.  Lipid and cholesterol check.** / Every 5 years  beginning at age 108 years.  Lung cancer screening. / Every year if you are aged 93-80 years and have a 30-pack-year history of smoking and currently smoke or have quit within the past 15 years. Yearly screening is stopped once you have quit smoking for at least 15 years or develop a health problem that would prevent you from having lung cancer treatment.  Clinical breast exam.** / Every year after age 62 years.  BRCA-related cancer risk assessment.** / For women who have family members with a BRCA-related cancer (breast, ovarian, tubal, or peritoneal cancers).  Mammogram.** / Every year beginning at age 55 years and continuing for as long as you are in good health. Consult with your health care provider.  Pap test.** / Every 3 years starting at age 74 years through age 55 or 23 years with 3 consecutive normal Pap tests. Testing can be stopped between 65 and 70 years with 3 consecutive normal Pap tests and no abnormal Pap or HPV tests in the past 10 years.  HPV screening.** / Every 3 years from ages 56 years through ages 70 or 70 years with a history of 3 consecutive normal Pap tests. Testing can be stopped between 65 and 70 years with 3 consecutive normal Pap tests and no abnormal Pap or HPV tests in the past 10 years.  Fecal occult blood test (FOBT) of stool. / Every year beginning at age 20 years and continuing until age 56 years. You may not need to do this test if you get a colonoscopy every 10 years.  Flexible sigmoidoscopy or colonoscopy.** / Every 5 years for a flexible sigmoidoscopy or every 10 years for a colonoscopy beginning at age 23 years and continuing until age 77 years.  Hepatitis C blood test.** / For all people born from [birth year] through 1964 and any individual with known risks for hepatitis C.  Osteoporosis screening.** / A one-time screening for women ages 5 years and over and women at risk for fractures or osteoporosis.  Skin self-exam. / Monthly.  Influenza vaccine. /  Every year.  Tetanus, diphtheria, and acellular pertussis (Tdap/Td) vaccine.** / 1 dose of Td every 10 years.  Varicella vaccine.** / Consult your health care provider.  Zoster vaccine.** / 1 dose for adults aged 43 years or older.  Pneumococcal 13-valent conjugate (PCV13) vaccine.** / Consult your health care provider.  Pneumococcal polysaccharide (PPSV23) vaccine.** / 1 dose for all adults aged 26 years and older.  Meningococcal vaccine.** / Consult your health care provider.  Hepatitis A vaccine.** / Consult your health care provider.  Hepatitis B vaccine.** / Consult your health care provider.  Haemophilus influenzae type b (Hib) vaccine.** / Consult your health care provider. ** Family history and personal history of risk and conditions may change your health care provider's recommendations. Document Released: 09/26/2001 Document Revised: 12/15/2013 Document Reviewed: 12/26/2010 [facility] Patient Information 2015 [place], [place]. This information is not intended to replace advice given  to you by your health care provider. Make sure you discuss any questions you have with your health care provider.  

## 2015-01-06 ENCOUNTER — Telehealth: Payer: Self-pay

## 2015-01-06 NOTE — Telephone Encounter (Signed)
-----   Message from [name], MD sent at 01/05/2015 11:19 PM EDT ----- Notify labs normal so far except triglycerides up slightly. Minimize simple carbs and fried foods and increase exercise as tolerated

## 2015-01-06 NOTE — Telephone Encounter (Signed)
Pt notified verbalized understanding. No questions or concerns at this time 

## 2015-01-08 LAB — VITAMIN D 1,25 DIHYDROXY
VITAMIN D 1, 25 (OH) TOTAL: 80 pg/mL — AB (ref 18–72)
Vitamin D2 1, 25 (OH)2: <8 pg/mL
Vitamin D3 1, 25 (OH)2: 80 pg/mL

## 2015-01-17 NOTE — Assessment & Plan Note (Signed)
Mild, EKG wnl. Likely stress induced. Patient will report if worsens. Minimize caffeine

## 2015-01-17 NOTE — Assessment & Plan Note (Signed)
Follows with Dr Carren Rang of GYN. Given Tdap today, labs done today

## 2015-01-17 NOTE — Assessment & Plan Note (Signed)
Has had trouble in the past and did have a consultation with ortho years ago. Had done better til about a month ago when her pain flared again. Now having most trouble with lifting and internal rotation. No radicular symptoms

## 2015-01-17 NOTE — Assessment & Plan Note (Signed)
Encouraged to follow with dermatology, no new concerning lesions

## 2015-01-17 NOTE — Progress Notes (Signed)
Joan Owen  [id number] August 04, 1956 01/17/2015      Progress Note-Follow Up  Subjective  Chief Complaint  Chief Complaint  Patient presents with  . Establish Care    Irregular heart beat when she lays down at night    HPI  Patient is a 59 y.o. female in today for routine medical care. Patient is in today to establish care. She is in good health but does have a few concerns. She is noticing episodes of palpitations most often in the evenings. She denies any obvious trigger. There are no associated symptoms. No shortness of breath, diaphoresis, nausea or chest pain. They resolve spontaneously. Do not keep her up at night. She follows with gynecology, Dr. Lowella Dell. No recent illness. She does note right shoulder pain. She has a distant history of trouble with her right shoulder which resolved but then in the last month has had increased pain, decreased range of motion without any obvious trauma or trigger. Denies CP/SOB/HA/congestion/fevers/GI or GU c/o. Taking meds as prescribed  Past Medical History  Diagnosis Date  . Palpitations 01/05/2015  . Right shoulder pain 01/05/2015  . Preventative health care 01/05/2015  . Osteopenia 01/05/2015  . History of chicken pox   . Anemia   . Anxiety   . BCC (basal cell carcinoma), face 01/05/2015    Lip    Past Surgical History  Procedure Laterality Date  . Cholecystectomy  1980  . Laparoscopy  1985  . Basal cell carcinoma excision      Family History  Problem Relation Age of Onset  . Colon cancer Neg Hx   . Esophageal cancer Neg Hx   . Rectal cancer Neg Hx   . Stomach cancer Neg Hx   . Hyperlipidemia Mother   . Cancer Mother     Skin  . Dementia Mother   . Heart disease Father   . Cancer Father     Skin  . Cancer Sister     Skin  . Cancer Brother     Skin  . Cancer Sister     Skin  . Thyroid nodules Daughter     History   Social History  . Marital Status: Married    Spouse Name: N/A  . Number of Children: N/A  . Years of  Education: N/A   Occupational History  . Not on file.   Social History Main Topics  . Smoking status: Never Smoker   . Smokeless tobacco: Never Used  . Alcohol Use: 1.2 oz/week    2 Glasses of wine per week  . Drug Use: No  . Sexual Activity: Not on file   Other Topics Concern  . Not on file   Social History Narrative    No current outpatient prescriptions on file prior to visit.   No current facility-administered medications on file prior to visit.    No Known Allergies  Review of Systems  Review of Systems  Constitutional: Negative for fever, chills and malaise/fatigue.  HENT: Negative for congestion, hearing loss and nosebleeds.   Eyes: Negative for discharge.  Respiratory: Negative for cough, sputum production, shortness of breath and wheezing.   Cardiovascular: Negative for chest pain, palpitations and leg swelling.  Gastrointestinal: Negative for heartburn, nausea, vomiting, abdominal pain, diarrhea, constipation and blood in stool.  Genitourinary: Negative for dysuria, urgency, frequency and hematuria.  Musculoskeletal: Negative for myalgias, back pain and falls.  Skin: Negative for rash.  Neurological: Negative for dizziness, tremors, sensory change, focal weakness, loss of consciousness, weakness  and headaches.  Endo/Heme/Allergies: Negative for polydipsia. Does not bruise/bleed easily.  Psychiatric/Behavioral: Negative for depression and suicidal ideas. The patient is not nervous/anxious and does not have insomnia.     Objective  BP 108/70 mmHg  Pulse 71  Temp(Src) 98.1 F (36.7 C) (Oral)  Resp 16  Ht 5\' 5"  (1.651 m)  Wt 153 lb (69.4 kg)  BMI 25.46 kg/m2  SpO2 99%  LMP 03/28/2013  Physical Exam  Physical Exam  Constitutional: She is oriented to person, place, and time and well-developed, well-nourished, and in no distress. No distress.  HENT:  Head: Normocephalic and atraumatic.  Right Ear: External ear normal.  Left Ear: External ear normal.    Nose: Nose normal.  Mouth/Throat: Oropharynx is clear and moist. No oropharyngeal exudate.  Eyes: Conjunctivae are normal. Pupils are equal, round, and reactive to light. Right eye exhibits no discharge. Left eye exhibits no discharge. No scleral icterus.  Neck: Normal range of motion. Neck supple. No thyromegaly present.  Cardiovascular: Normal rate, regular rhythm, normal heart sounds and intact distal pulses.   No murmur heard. Pulmonary/Chest: Effort normal and breath sounds normal. No respiratory distress. She has no wheezes. She has no rales.  Abdominal: Soft. Bowel sounds are normal. She exhibits no distension and no mass. There is no tenderness.  Musculoskeletal: Normal range of motion. She exhibits no edema or tenderness.  Lymphadenopathy:    She has no cervical adenopathy.  Neurological: She is alert and oriented to person, place, and time. She has normal reflexes. No cranial nerve deficit. Coordination normal.  Skin: Skin is warm and dry. No rash noted. She is not diaphoretic.  Psychiatric: Mood, memory and affect normal.    Lab Results  Component Value Date   TSH 0.78 01/05/2015   Lab Results  Component Value Date   WBC 4.4 01/05/2015   HGB 12.8 01/05/2015   HCT 37.8 01/05/2015   MCV 90.8 01/05/2015   PLT 215.0 01/05/2015   Lab Results  Component Value Date   CREATININE 0.78 01/05/2015   BUN 14 01/05/2015   NA 139 01/05/2015   K 4.0 01/05/2015   CL 104 01/05/2015   CO2 32 01/05/2015   Lab Results  Component Value Date   ALT 14 01/05/2015   AST 16 01/05/2015   ALKPHOS 68 01/05/2015   BILITOT 0.7 01/05/2015   Lab Results  Component Value Date   CHOL 176 01/05/2015   Lab Results  Component Value Date   HDL 47.90 01/05/2015   Lab Results  Component Value Date   LDLCALC 89 01/05/2015   Lab Results  Component Value Date   TRIG 196.0* 01/05/2015   Lab Results  Component Value Date   CHOLHDL 4 01/05/2015     Assessment & Plan  Right shoulder  pain Has had trouble in the past and did have a consultation with ortho years ago. Had done better til about a month ago when her pain flared again. Now having most trouble with lifting and internal rotation. No radicular symptoms   Osteopenia Check vitamin D level which was actually mildly high. Decrease supplements and monitor. Stay active with weight bearing exercises   BCC (basal cell carcinoma), face Encouraged to follow with dermatology, no new concerning lesions   Palpitations Mild, EKG wnl. Likely stress induced. Patient will report if worsens. Minimize caffeine   Preventative health care Follows with Dr Carren Rang of GYN. Given Tdap today, labs done today

## 2015-01-17 NOTE — Assessment & Plan Note (Signed)
Check vitamin D level which was actually mildly high. Decrease supplements and monitor. Stay active with weight bearing exercises

## 2019-07-16 LAB — HM MAMMOGRAPHY

## 2019-07-17 LAB — HM DEXA SCAN

## 2019-09-01 LAB — HM PAP SMEAR

## 2019-09-03 ENCOUNTER — Telehealth: Payer: Self-pay | Admitting: Family Medicine

## 2019-09-03 NOTE — Telephone Encounter (Signed)
Pt came in office stating is needing a TDap since pt is having a new grand baby coming in the family and is needing a TDap before February. Please advise. Pt tel [phone].

## 2019-09-03 NOTE — Telephone Encounter (Signed)
Or please call pt at [phone].

## 2019-09-04 NOTE — Telephone Encounter (Signed)
Please advise 

## 2019-09-04 NOTE — Telephone Encounter (Signed)
Happy to give her a Tdap but encourage her to check with her insurance and see if it is cheaper for her to get it here or at the pharmacy and then if she wants it here set her up for nurse visit. Also she had one in 2016 so they may consider that recently enough as it has been in the last 5 years

## 2019-09-05 NOTE — Telephone Encounter (Signed)
Spoke with patient about message below she agreed and made a re establish appt

## 2019-10-31 ENCOUNTER — Encounter: Payer: Self-pay | Admitting: Family Medicine

## 2019-10-31 ENCOUNTER — Ambulatory Visit (INDEPENDENT_AMBULATORY_CARE_PROVIDER_SITE_OTHER): Payer: Managed Care, Other (non HMO) | Admitting: Family Medicine

## 2019-10-31 ENCOUNTER — Other Ambulatory Visit: Payer: Self-pay

## 2019-10-31 DIAGNOSIS — R002 Palpitations: Secondary | ICD-10-CM | POA: Diagnosis not present

## 2019-10-31 DIAGNOSIS — M810 Age-related osteoporosis without current pathological fracture: Secondary | ICD-10-CM | POA: Diagnosis not present

## 2019-10-31 DIAGNOSIS — E785 Hyperlipidemia, unspecified: Secondary | ICD-10-CM | POA: Diagnosis not present

## 2019-10-31 DIAGNOSIS — Z0000 Encounter for general adult medical examination without abnormal findings: Secondary | ICD-10-CM

## 2019-10-31 NOTE — Assessment & Plan Note (Signed)
Encouraged heart healthy diet, increase exercise, avoid trans fats, consider a krill oil cap daily 

## 2019-10-31 NOTE — Assessment & Plan Note (Signed)
No recent c/o will check lab work

## 2019-10-31 NOTE — Patient Instructions (Signed)
[employer] Blood Pressure cuff, upper arm, want BP 100-140/60-90 Pulse oximeter, want oxygen in 90s  Weekly vitals  Take Multivitamin with minerals, selenium Vitamin D 1000-2000 IU daily Probiotic with lactobacillus and bifidophilus Asprin EC 81 mg daily  Melatonin 2-5 mg at bedtime  [email or web address]   Preventive Care 18-63 Years Old, Female Preventive care refers to visits with your health care provider and lifestyle choices that can promote health and wellness. This includes:  A yearly physical exam. This may also be called an annual well check.  Regular dental visits and eye exams.  Immunizations.  Screening for certain conditions.  Healthy lifestyle choices, such as eating a healthy diet, getting regular exercise, not using drugs or products that contain nicotine and tobacco, and limiting alcohol use. What can I expect for my preventive care visit? Physical exam Your health care provider will check your:  Height and weight. This may be used to calculate body mass index (BMI), which tells if you are at a healthy weight.  Heart rate and blood pressure.  Skin for abnormal spots. Counseling Your health care provider may ask you questions about your:  Alcohol, tobacco, and drug use.  Emotional well-being.  Home and relationship well-being.  Sexual activity.  Eating habits.  Work and work Statistician.  Method of birth control.  Menstrual cycle.  Pregnancy history. What immunizations do I need?  Influenza (flu) vaccine  This is recommended every year. Tetanus, diphtheria, and pertussis (Tdap) vaccine  You may need a Td booster every 10 years. Varicella (chickenpox) vaccine  You may need this if you have not been vaccinated. Zoster (shingles) vaccine  You may need this after age 43. Measles, mumps, and rubella (MMR) vaccine  You may need at least one dose of MMR if you were born in 1957 or later. You may also need a second dose. Pneumococcal  conjugate (PCV13) vaccine  You may need this if you have certain conditions and were not previously vaccinated. Pneumococcal polysaccharide (PPSV23) vaccine  You may need one or two doses if you smoke cigarettes or if you have certain conditions. Meningococcal conjugate (MenACWY) vaccine  You may need this if you have certain conditions. Hepatitis A vaccine  You may need this if you have certain conditions or if you travel or work in places where you may be exposed to hepatitis A. Hepatitis B vaccine  You may need this if you have certain conditions or if you travel or work in places where you may be exposed to hepatitis B. Haemophilus influenzae type b (Hib) vaccine  You may need this if you have certain conditions. Human papillomavirus (HPV) vaccine  If recommended by your health care provider, you may need three doses over 6 months. You may receive vaccines as individual doses or as more than one vaccine together in one shot (combination vaccines). Talk with your health care provider about the risks and benefits of combination vaccines. What tests do I need? Blood tests  Lipid and cholesterol levels. These may be checked every 5 years, or more frequently if you are over 22 years old.  Hepatitis C test.  Hepatitis B test. Screening  Lung cancer screening. You may have this screening every year starting at age 82 if you have a 30-pack-year history of smoking and currently smoke or have quit within the past 15 years.  Colorectal cancer screening. All adults should have this screening starting at age 29 and continuing until age 38. Your health care provider may recommend screening at age  45 if you are at increased risk. You will have tests every 1-10 years, depending on your results and the type of screening test.  Diabetes screening. This is done by checking your blood sugar (glucose) after you have not eaten for a while (fasting). You may have this done every 1-3  years.  Mammogram. This may be done every 1-2 years. Talk with your health care provider about when you should start having regular mammograms. This may depend on whether you have a family history of breast cancer.  BRCA-related cancer screening. This may be done if you have a family history of breast, ovarian, tubal, or peritoneal cancers.  Pelvic exam and Pap test. This may be done every 3 years starting at age 59. Starting at age 70, this may be done every 5 years if you have a Pap test in combination with an HPV test. Other tests  Sexually transmitted disease (STD) testing.  Bone density scan. This is done to screen for osteoporosis. You may have this scan if you are at high risk for osteoporosis. Follow these instructions at home: Eating and drinking  Eat a diet that includes fresh fruits and vegetables, whole grains, lean protein, and low-fat dairy.  Take vitamin and mineral supplements as recommended by your health care provider.  Do not drink alcohol if: ? Your health care provider tells you not to drink. ? You are pregnant, may be pregnant, or are planning to become pregnant.  If you drink alcohol: ? Limit how much you have to 0-1 drink a day. ? Be aware of how much alcohol is in your drink. In the [place] one drink equals one 12 oz bottle of beer (355 mL), one 5 oz glass of wine (148 mL), or one 1 oz glass of hard liquor (44 mL). Lifestyle  Take daily care of your teeth and gums.  Stay active. Exercise for at least 30 minutes on 5 or more days each week.  Do not use any products that contain nicotine or tobacco, such as cigarettes, e-cigarettes, and chewing tobacco. If you need help quitting, ask your health care provider.  If you are sexually active, practice safe sex. Use a condom or other form of birth control (contraception) in order to prevent pregnancy and STIs (sexually transmitted infections).  If told by your health care provider, take low-dose aspirin daily  starting at age 60. What's next?  Visit your health care provider once a year for a well check visit.  Ask your health care provider how often you should have your eyes and teeth checked.  Stay up to date on all vaccines. This information is not intended to replace advice given to you by your health care provider. Make sure you discuss any questions you have with your health care provider. Document Revised: 04/11/2018 Document Reviewed: 04/11/2018 Elsevier Patient Education  [place].  ToxicBlast.pl  The mRNA technology has been in development for 20 years and we already had the Coronavirus family of viruses (which usually just cause the common cold) genetically mapped already which is why we were able to come up with viable vaccine candidates so quickly in stage 1, then stage 2 scientifically took the correct amount of time what we did to speed it up was just build the manufacturing platform at the same time we were running the experiments so if it worked we could produce faster. And stage 3 has now had many months and millions of people immunized and we are seeing the immunity  hold for over 9 months now with sign of it dissipating and no significant numbers of adverse reactions.  During every flu season we see 2 anaphylactic reactions for every million shots given and we initially thought we would see 11 per million with the COVID vaccine but now we see only 2-3 with Moderna and 5 or so with [place] so compared to someone is dying every 20 minutes from [place] and more deadly and infectious strains are coming it is definitely best when weighing the risks and benefits to take the shots.  Another pooled analysis of the 5 most utilized vaccines in the world shows that after full immunization so far no one has died from [place].

## 2019-10-31 NOTE — Assessment & Plan Note (Signed)
She is re establishing care and has not had a full panel of labs or been seen here in 5 years. Check labs today. Encouraged increased exercise and heart healthy diet. Discussed immunizations and will request results from OB/GYN

## 2019-10-31 NOTE — Progress Notes (Signed)
Virtual Visit via Video Note  I connected with Joan Owen on 10/31/19 at  8:20 AM EDT by a video enabled telemedicine application and verified that I am speaking with the correct person using two identifiers.  Location: Patient: home Provider: home   I discussed the limitations of evaluation and management by telemedicine and the availability of in person appointments. The patient expressed understanding and agreed to proceed. Magdalene Molly, CMA was able to get the patient set up on a video visit   Subjective:    Patient ID: Joan Owen, female    DOB: October 13, 1955, 64 y.o.   MRN: [id number]  No chief complaint on file.   HPI Patient is in today for re establishing care. She is doing well today. She has been able to keep working during the pandemic but has not had any acute febrile illness or hospitalizations. She has not been seen her in roughly 5 years but has been seeing her OB/GYN and is doing well although her osteopenia has progressed to osteoporosis. She has been less active over the winter but plans to get moving. She is trying to maintain a heart healthy diet. Denies CP/palp/SOB/HA/congestion/fevers/GI or GU c/o. Taking meds as prescribed  Past Medical History:  Diagnosis Date  . Anemia   . Anxiety   . BCC (basal cell carcinoma), face 01/05/2015   Lip  . History of chicken pox   . Osteopenia 01/05/2015  . Palpitations 01/05/2015  . Preventative health care 01/05/2015  . Right shoulder pain 01/05/2015    Past Surgical History:  Procedure Laterality Date  . BASAL CELL CARCINOMA EXCISION    . CHOLECYSTECTOMY  1980  . LAPAROSCOPY  1985    Family History  Problem Relation Age of Onset  . Hyperlipidemia Mother   . Cancer Mother        Skin  . Dementia Mother        Alzheimer  . Heart disease Father   . Cancer Father        Skin  . Cancer Sister        Skin  . Cancer Brother        Skin  . Cancer Sister        Skin  . Dementia Sister   . Thyroid nodules Daughter    . Colon cancer Neg Hx   . Esophageal cancer Neg Hx   . Rectal cancer Neg Hx   . Stomach cancer Neg Hx     Social History   Socioeconomic History  . Marital status: Married    Spouse name: Not on file  . Number of children: Not on file  . Years of education: Not on file  . Highest education level: Not on file  Occupational History  . Not on file  Tobacco Use  . Smoking status: Never Smoker  . Smokeless tobacco: Never Used  Substance and Sexual Activity  . Alcohol use: Yes    Alcohol/week: 2.0 standard drinks    Types: 2 Glasses of wine per week  . Drug use: No  . Sexual activity: Not on file  Other Topics Concern  . Not on file  Social History Narrative  . Not on file   Social Determinants of Health   Financial Resource Strain:   . Difficulty of Paying Living Expenses:   Food Insecurity:   . Worried About Charity fundraiser in the Last Year:   . Arboriculturist in the Last Year:   Transportation Needs:   .  Lack of Transportation (Medical):   Marland Kitchen Lack of Transportation (Non-Medical):   Physical Activity:   . Days of Exercise per Week:   . Minutes of Exercise per Session:   Stress:   . Feeling of Stress :   Social Connections:   . Frequency of Communication with Friends and Family:   . Frequency of Social Gatherings with Friends and Family:   . Attends Religious Services:   . Active Member of Clubs or Organizations:   . Attends Archivist Meetings:   Marland Kitchen Marital Status:   Intimate Partner Violence:   . Fear of Current or Ex-Partner:   . Emotionally Abused:   Marland Kitchen Physically Abused:   . Sexually Abused:     Outpatient Medications Prior to Visit  Medication Sig Dispense Refill  . Calcium Citrate 250 MG TABS Take by mouth.    . Cholecalciferol (VITAMIN D-3 PO) Take by mouth.     No facility-administered medications prior to visit.    No Known Allergies  Review of Systems  Constitutional: Negative for chills, fever and malaise/fatigue.  HENT:  Negative for congestion and hearing loss.   Eyes: Negative for discharge.  Respiratory: Negative for cough, sputum production and shortness of breath.   Cardiovascular: Negative for chest pain, palpitations and leg swelling.  Gastrointestinal: Negative for abdominal pain, blood in stool, constipation, diarrhea, heartburn, nausea and vomiting.  Genitourinary: Negative for dysuria, frequency, hematuria and urgency.  Musculoskeletal: Negative for back pain, falls and myalgias.  Skin: Negative for rash.  Neurological: Negative for dizziness, sensory change, loss of consciousness, weakness and headaches.  Endo/Heme/Allergies: Negative for environmental allergies. Does not bruise/bleed easily.  Psychiatric/Behavioral: Negative for depression and suicidal ideas. The patient is not nervous/anxious and does not have insomnia.        Objective:    Physical Exam Constitutional:      Appearance: Normal appearance. She is not ill-appearing.  HENT:     Head: Normocephalic and atraumatic.     Right Ear: External ear normal.     Left Ear: External ear normal.     Nose: Nose normal.  Eyes:     Conjunctiva/sclera: Conjunctivae normal.  Pulmonary:     Effort: Pulmonary effort is normal.  Neurological:     Mental Status: She is alert and oriented to person, place, and time.  Psychiatric:        Behavior: Behavior normal.     LMP 03/28/2013  Wt Readings from Last 3 Encounters:  01/05/15 153 lb (69.4 kg)  09/10/13 158 lb (71.7 kg)  08/27/13 158 lb 3.2 oz (71.8 kg)    Diabetic Foot Exam - Simple   No data filed     Lab Results  Component Value Date   WBC 4.4 01/05/2015   HGB 12.8 01/05/2015   HCT 37.8 01/05/2015   PLT 215.0 01/05/2015   GLUCOSE 97 01/05/2015   CHOL 176 01/05/2015   TRIG 196.0 (H) 01/05/2015   HDL 47.90 01/05/2015   LDLCALC 89 01/05/2015   ALT 14 01/05/2015   AST 16 01/05/2015   NA 139 01/05/2015   K 4.0 01/05/2015   CL 104 01/05/2015   CREATININE 0.78  01/05/2015   BUN 14 01/05/2015   CO2 32 01/05/2015   TSH 0.78 01/05/2015    Lab Results  Component Value Date   TSH 0.78 01/05/2015   Lab Results  Component Value Date   WBC 4.4 01/05/2015   HGB 12.8 01/05/2015   HCT 37.8 01/05/2015   MCV  90.8 01/05/2015   PLT 215.0 01/05/2015   Lab Results  Component Value Date   NA 139 01/05/2015   K 4.0 01/05/2015   CO2 32 01/05/2015   GLUCOSE 97 01/05/2015   BUN 14 01/05/2015   CREATININE 0.78 01/05/2015   BILITOT 0.7 01/05/2015   ALKPHOS 68 01/05/2015   AST 16 01/05/2015   ALT 14 01/05/2015   PROT 7.1 01/05/2015   ALBUMIN 4.3 01/05/2015   CALCIUM 9.3 01/05/2015   GFR 80.72 01/05/2015   Lab Results  Component Value Date   CHOL 176 01/05/2015   Lab Results  Component Value Date   HDL 47.90 01/05/2015   Lab Results  Component Value Date   LDLCALC 89 01/05/2015   Lab Results  Component Value Date   TRIG 196.0 (H) 01/05/2015   Lab Results  Component Value Date   CHOLHDL 4 01/05/2015   No results found for: HGBA1C     Assessment & Plan:   Problem List Items Addressed This Visit    Palpitations    No recent c/o will check lab work      Preventative health care - Primary    She is re establishing care and has not had a full panel of labs or been seen here in 5 years. Check labs today. Encouraged increased exercise and heart healthy diet. Discussed immunizations and will request results from OB/GYN      Relevant Orders   CBC   Comprehensive metabolic panel   TSH   Lipid panel   Osteoporosis    Have asked for Dexa scan from her OB/GYN practice. Dr Harless Nakayama at [facility]. She has progressed from osteopenia to osteoporosis and they have prescribed Fosamax but she is not going to start it until she has completed some dental work. Will check Vitamin D level today. Encouraged to get adequate exercise, calcium and vitamin d intake      Relevant Orders   VITAMIN D 25 Hydroxy (Vit-D Deficiency,  Fractures)   Hyperlipidemia    Encouraged heart healthy diet, increase exercise, avoid trans fats, consider a krill oil cap daily         I am having Nigel Sloop maintain her Calcium Citrate and Cholecalciferol (VITAMIN D-3 PO).  No orders of the defined types were placed in this encounter.    I discussed the assessment and treatment plan with the patient. The patient was provided an opportunity to ask questions and all were answered. The patient agreed with the plan and demonstrated an understanding of the instructions.   The patient was advised to call back or seek an in-person evaluation if the symptoms worsen or if the condition fails to improve as anticipated.  I provided 25 minutes of non-face-to-face time during this encounter.   Penni Homans, MD

## 2019-10-31 NOTE — Assessment & Plan Note (Signed)
Have asked for Dexa scan from her OB/GYN practice. Dr Harless Nakayama at [facility]. She has progressed from osteopenia to osteoporosis and they have prescribed Fosamax but she is not going to start it until she has completed some dental work. Will check Vitamin D level today. Encouraged to get adequate exercise, calcium and vitamin d intake

## 2019-11-04 ENCOUNTER — Other Ambulatory Visit (INDEPENDENT_AMBULATORY_CARE_PROVIDER_SITE_OTHER): Payer: Managed Care, Other (non HMO)

## 2019-11-04 ENCOUNTER — Other Ambulatory Visit: Payer: Self-pay

## 2019-11-04 DIAGNOSIS — Z0000 Encounter for general adult medical examination without abnormal findings: Secondary | ICD-10-CM

## 2019-11-04 DIAGNOSIS — M810 Age-related osteoporosis without current pathological fracture: Secondary | ICD-10-CM

## 2019-11-04 LAB — COMPREHENSIVE METABOLIC PANEL
ALT: 13 U/L (ref 0–35)
AST: 14 U/L (ref 0–37)
Albumin: 4.2 g/dL (ref 3.5–5.2)
Alkaline Phosphatase: 67 U/L (ref 39–117)
BUN: 14 mg/dL (ref 6–23)
CO2: 30 mEq/L (ref 19–32)
Calcium: 9.1 mg/dL (ref 8.4–10.5)
Chloride: 103 mEq/L (ref 96–112)
Creatinine, Ser: 0.89 mg/dL (ref 0.40–1.20)
GFR: 64.17 mL/min (ref >60.00)
Glucose, Bld: 116 mg/dL — ABNORMAL HIGH (ref 70–99)
Potassium: 3.9 mEq/L (ref 3.5–5.1)
Sodium: 139 mEq/L (ref 135–145)
Total Bilirubin: 0.6 mg/dL (ref 0.2–1.2)
Total Protein: 6.6 g/dL (ref 6.0–8.3)

## 2019-11-04 LAB — VITAMIN D 25 HYDROXY (VIT D DEFICIENCY, FRACTURES): VITD: 27.08 ng/mL — ABNORMAL LOW (ref 30.00–100.00)

## 2019-11-04 LAB — LIPID PANEL
Cholesterol: 198 mg/dL (ref 0–200)
HDL: 44.8 mg/dL (ref >39.00)
NonHDL: 152.88
Total CHOL/HDL Ratio: 4
Triglycerides: 336 mg/dL — ABNORMAL HIGH (ref 0.0–149.0)
VLDL: 67.2 mg/dL — ABNORMAL HIGH (ref 0.0–40.0)

## 2019-11-04 LAB — CBC
HCT: 36 % (ref 36.0–46.0)
Hemoglobin: 12.1 g/dL (ref 12.0–15.0)
MCHC: 33.7 g/dL (ref 30.0–36.0)
MCV: 91.5 fl (ref 78.0–100.0)
Platelets: 232 10*3/uL (ref 150.0–400.0)
RBC: 3.93 Mil/uL (ref 3.87–5.11)
RDW: 13.6 % (ref 11.5–15.5)
WBC: 4.7 10*3/uL (ref 4.0–10.5)

## 2019-11-04 LAB — TSH: TSH: 0.72 u[IU]/mL (ref 0.35–4.50)

## 2019-11-04 LAB — LDL CHOLESTEROL, DIRECT: Direct LDL: 103 mg/dL

## 2019-11-05 ENCOUNTER — Ambulatory Visit: Payer: BLUE CROSS/BLUE SHIELD | Admitting: Family Medicine

## 2020-07-20 ENCOUNTER — Other Ambulatory Visit (HOSPITAL_BASED_OUTPATIENT_CLINIC_OR_DEPARTMENT_OTHER): Payer: Self-pay | Admitting: Internal Medicine

## 2020-07-20 ENCOUNTER — Ambulatory Visit: Payer: Managed Care, Other (non HMO) | Attending: Internal Medicine

## 2020-07-20 DIAGNOSIS — Z23 Encounter for immunization: Secondary | ICD-10-CM

## 2020-07-20 NOTE — Progress Notes (Signed)
   Covid-19 Vaccination Clinic  Name:  Jessicia Napolitano    MRN: [id number] DOB: Nov 22, 1955  07/20/2020  Ms. Eve was observed post Covid-19 immunization for 15 minutes without incident. She was provided with Vaccine Information Sheet and instruction to access the V-Safe system.   Ms. Bermingham was instructed to call 911 with any severe reactions post vaccine: Marland Kitchen Difficulty breathing  . Swelling of face and throat  . A fast heartbeat  . A bad rash all over body  . Dizziness and weakness   Immunizations Administered    Name Date Dose VIS Date Route   Pfizer COVID-19 Vaccine 07/20/2020 12:15 PM 0.3 mL 06/02/2020 Intramuscular   Manufacturer: [place]   Lot: [id number]   [place]: [id number]

## 2020-07-27 MED FILL — PFIZER-BIONTECH COVID-19 VA: 30 | 1 days supply | Qty: 0 | Fill #0

## 2020-11-02 ENCOUNTER — Ambulatory Visit (INDEPENDENT_AMBULATORY_CARE_PROVIDER_SITE_OTHER): Payer: Managed Care, Other (non HMO) | Admitting: Family Medicine

## 2020-11-02 ENCOUNTER — Other Ambulatory Visit: Payer: Self-pay

## 2020-11-02 ENCOUNTER — Encounter: Payer: Self-pay | Admitting: Family Medicine

## 2020-11-02 VITALS — BP 102/64 | HR 83 | Temp 97.7°F | Resp 16 | Ht 65.0 in | Wt 150.4 lb

## 2020-11-02 DIAGNOSIS — Z0000 Encounter for general adult medical examination without abnormal findings: Secondary | ICD-10-CM

## 2020-11-02 DIAGNOSIS — M810 Age-related osteoporosis without current pathological fracture: Secondary | ICD-10-CM | POA: Diagnosis not present

## 2020-11-02 DIAGNOSIS — D649 Anemia, unspecified: Secondary | ICD-10-CM

## 2020-11-02 DIAGNOSIS — E785 Hyperlipidemia, unspecified: Secondary | ICD-10-CM | POA: Diagnosis not present

## 2020-11-02 DIAGNOSIS — E559 Vitamin D deficiency, unspecified: Secondary | ICD-10-CM | POA: Diagnosis not present

## 2020-11-02 LAB — COMPREHENSIVE METABOLIC PANEL
ALT: 12 U/L (ref 0–35)
AST: 14 U/L (ref 0–37)
Albumin: 4.6 g/dL (ref 3.5–5.2)
Alkaline Phosphatase: 72 U/L (ref 39–117)
BUN: 25 mg/dL — ABNORMAL HIGH (ref 6–23)
CO2: 31 mEq/L (ref 19–32)
Calcium: 10 mg/dL (ref 8.4–10.5)
Chloride: 102 mEq/L (ref 96–112)
Creatinine, Ser: 0.8 mg/dL (ref 0.40–1.20)
GFR: 78.38 mL/min (ref >60.00)
Glucose, Bld: 84 mg/dL (ref 70–99)
Potassium: 4.8 mEq/L (ref 3.5–5.1)
Sodium: 139 mEq/L (ref 135–145)
Total Bilirubin: 1 mg/dL (ref 0.2–1.2)
Total Protein: 7 g/dL (ref 6.0–8.3)

## 2020-11-02 LAB — CBC WITH DIFFERENTIAL/PLATELET
Basophils Absolute: 0 10*3/uL (ref 0.0–0.1)
Basophils Relative: 1 % (ref 0.0–3.0)
Eosinophils Absolute: 0.1 10*3/uL (ref 0.0–0.7)
Eosinophils Relative: 3.2 % (ref 0.0–5.0)
HCT: 39.4 % (ref 36.0–46.0)
Hemoglobin: 13.2 g/dL (ref 12.0–15.0)
Lymphocytes Relative: 29.6 % (ref 12.0–46.0)
Lymphs Abs: 1.2 10*3/uL (ref 0.7–4.0)
MCHC: 33.5 g/dL (ref 30.0–36.0)
MCV: 91.5 fl (ref 78.0–100.0)
Monocytes Absolute: 0.3 10*3/uL (ref 0.1–1.0)
Monocytes Relative: 8.4 % (ref 3.0–12.0)
Neutro Abs: 2.3 10*3/uL (ref 1.4–7.7)
Neutrophils Relative %: 57.8 % (ref 43.0–77.0)
Platelets: 255 10*3/uL (ref 150.0–400.0)
RBC: 4.3 Mil/uL (ref 3.87–5.11)
RDW: 13.6 % (ref 11.5–15.5)
WBC: 4 10*3/uL (ref 4.0–10.5)

## 2020-11-02 LAB — LIPID PANEL
Cholesterol: 225 mg/dL — ABNORMAL HIGH (ref 0–200)
HDL: 57 mg/dL (ref >39.00)
LDL Cholesterol: 142 mg/dL — ABNORMAL HIGH (ref 0–99)
NonHDL: 167.65
Total CHOL/HDL Ratio: 4
Triglycerides: 127 mg/dL (ref 0.0–149.0)
VLDL: 25.4 mg/dL (ref 0.0–40.0)

## 2020-11-02 LAB — TSH: TSH: 1.09 u[IU]/mL (ref 0.35–4.50)

## 2020-11-02 LAB — VITAMIN B12: Vitamin B-12: 364 pg/mL (ref 211–911)

## 2020-11-02 NOTE — Assessment & Plan Note (Signed)
Encouraged to get adequate exercise, calcium and vitamin d intake 

## 2020-11-02 NOTE — Patient Instructions (Signed)
Preventive Care 84-64 Years Old, Female Preventive care refers to lifestyle choices and visits with your health care provider that can promote health and wellness. This includes:  A yearly physical exam. This is also called an annual wellness visit.  Regular dental and eye exams.  Immunizations.  Screening for certain conditions.  Healthy lifestyle choices, such as: ? Eating a healthy diet. ? Getting regular exercise. ? Not using drugs or products that contain nicotine and tobacco. ? Limiting alcohol use. What can I expect for my preventive care visit? Physical exam Your health care provider will check your:  Height and weight. These may be used to calculate your BMI (body mass index). BMI is a measurement that tells if you are at a healthy weight.  Heart rate and blood pressure.  Body temperature.  Skin for abnormal spots. Counseling Your health care provider may ask you questions about your:  Past medical problems.  Family's medical history.  Alcohol, tobacco, and drug use.  Emotional well-being.  Home life and relationship well-being.  Sexual activity.  Diet, exercise, and sleep habits.  Work and work Statistician.  Access to firearms.  Method of birth control.  Menstrual cycle.  Pregnancy history. What immunizations do I need? Vaccines are usually given at various ages, according to a schedule. Your health care provider will recommend vaccines for you based on your age, medical history, and lifestyle or other factors, such as travel or where you work.   What tests do I need? Blood tests  Lipid and cholesterol levels. These may be checked every 5 years, or more often if you are over 3 years old.  Hepatitis C test.  Hepatitis B test. Screening  Lung cancer screening. You may have this screening every year starting at age 73 if you have a 30-pack-year history of smoking and currently smoke or have quit within the past 15 years.  Colorectal cancer  screening. ? All adults should have this screening starting at age 52 and continuing until age 17. ? Your health care provider may recommend screening at age 49 if you are at increased risk. ? You will have tests every 1-10 years, depending on your results and the type of screening test.  Diabetes screening. ? This is done by checking your blood sugar (glucose) after you have not eaten for a while (fasting). ? You may have this done every 1-3 years.  Mammogram. ? This may be done every 1-2 years. ? Talk with your health care provider about when you should start having regular mammograms. This may depend on whether you have a family history of breast cancer.  BRCA-related cancer screening. This may be done if you have a family history of breast, ovarian, tubal, or peritoneal cancers.  Pelvic exam and Pap test. ? This may be done every 3 years starting at age 10. ? Starting at age 11, this may be done every 5 years if you have a Pap test in combination with an HPV test. Other tests  STD (sexually transmitted disease) testing, if you are at risk.  Bone density scan. This is done to screen for osteoporosis. You may have this scan if you are at high risk for osteoporosis. Talk with your health care provider about your test results, treatment options, and if necessary, the need for more tests. Follow these instructions at home: Eating and drinking  Eat a diet that includes fresh fruits and vegetables, whole grains, lean protein, and low-fat dairy products.  Take vitamin and mineral supplements  as recommended by your health care provider.  Do not drink alcohol if: ? Your health care provider tells you not to drink. ? You are pregnant, may be pregnant, or are planning to become pregnant.  If you drink alcohol: ? Limit how much you have to 0-1 drink a day. ? Be aware of how much alcohol is in your drink. In the [place] one drink equals one 12 oz bottle of beer (355 mL), one 5 oz glass of  wine (148 mL), or one 1 oz glass of hard liquor (44 mL).   Lifestyle  Take daily care of your teeth and gums. Brush your teeth every morning and night with fluoride toothpaste. Floss one time each day.  Stay active. Exercise for at least 30 minutes 5 or more days each week.  Do not use any products that contain nicotine or tobacco, such as cigarettes, e-cigarettes, and chewing tobacco. If you need help quitting, ask your health care provider.  Do not use drugs.  If you are sexually active, practice safe sex. Use a condom or other form of protection to prevent STIs (sexually transmitted infections).  If you do not wish to become pregnant, use a form of birth control. If you plan to become pregnant, see your health care provider for a prepregnancy visit.  If told by your health care provider, take low-dose aspirin daily starting at age 6.  Find healthy ways to cope with stress, such as: ? Meditation, yoga, or listening to music. ? Journaling. ? Talking to a trusted person. ? Spending time with friends and family. Safety  Always wear your seat belt while driving or riding in a vehicle.  Do not drive: ? If you have been drinking alcohol. Do not ride with someone who has been drinking. ? When you are tired or distracted. ? While texting.  Wear a helmet and other protective equipment during sports activities.  If you have firearms in your house, make sure you follow all gun safety procedures. What's next?  Visit your health care provider once a year for an annual wellness visit.  Ask your health care provider how often you should have your eyes and teeth checked.  Stay up to date on all vaccines. This information is not intended to replace advice given to you by your health care provider. Make sure you discuss any questions you have with your health care provider. Document Revised: 05/04/2020 Document Reviewed: 04/11/2018 Elsevier Patient Education  2021 [employer]. .  Recommend calcium intake of 1200 to 1500 mg daily, divided into roughly 3 doses. Best source is the diet and a single dairy serving is about 500 mg, a supplement of calcium citrate once or twice daily to balance diet is fine if not getting enough in diet. Also need Vitamin D 2000 IU caps, 1 cap daily if not already taking vitamin D. Also recommend weight baring exercise on hips and upper body to keep bones strong

## 2020-11-02 NOTE — Progress Notes (Signed)
Patient ID: Joan Owen, female    DOB: December 03, 1955  Age: 65 y.o. MRN: [id number]    Subjective:  Subjective  HPI Joan Owen presents for comprehensive physical exam today and follow up on management of chronic concerns. She was recently dx with osteoporosis and is not actively taking Bone Up and vitamins to treat. She also she eats yogurt. She states that she is walking frequently at her job and she is maintaining a healthy diet. She denies any chest pain, SOB, fever, abdominal pain, cough, chills, sore throat, dysuria, urinary incontinence, back pain, HA, or N/VD at this time.   She contracted chicken pox in her childhood but declines the shingles shot today. She reports having anemia when she was pregnant. She denies having low levels of B12, but she is requesting to test her levels of B12.   Her sister was recently dx with early onset alzheimer's disease; her mother also had dementia, before her passing. She denies having any symptoms or signs of dementia but she reports that she will continue to monitor this.    Review of Systems  Constitutional: Negative for chills, fatigue and fever.  HENT: Negative for congestion, rhinorrhea, sinus pressure, sinus pain and sore throat.   Eyes: Negative for pain.  Respiratory: Negative for shortness of breath.   Cardiovascular: Negative for chest pain, palpitations and leg swelling.  Gastrointestinal: Negative for abdominal pain, blood in stool, diarrhea, nausea and vomiting.  Genitourinary: Negative for flank pain, frequency, vaginal bleeding, vaginal discharge and vaginal pain.  Musculoskeletal: Negative for joint swelling, myalgias and neck pain.  Neurological: Negative for headaches.    History Past Medical History:  Diagnosis Date  . Anemia   . Anxiety   . BCC (basal cell carcinoma), face 01/05/2015   Lip  . History of chicken pox   . Osteopenia 01/05/2015  . Palpitations 01/05/2015  . Preventative health care 01/05/2015  . Right shoulder  pain 01/05/2015    She has a past surgical history that includes Cholecystectomy (1980); laparoscopy (1985); and Excision basal cell carcinoma.   Her family history includes Alzheimer's disease in her mother and sister; Dementia in her mother and sister; Heart disease in her father; Thyroid nodules in her daughter.She reports that she has never smoked. She has never used smokeless tobacco. She reports current alcohol use of about 2.0 standard drinks of alcohol per week. She reports that she does not use drugs.  Current Outpatient Medications on File Prior to Visit  Medication Sig Dispense Refill  . Calcium Citrate 250 MG TABS Take by mouth.    . Cholecalciferol (VITAMIN D-3 PO) Take by mouth.     No current facility-administered medications on file prior to visit.     Objective:  Objective  Physical Exam Vitals and nursing note reviewed.  Constitutional:      General: She is not in acute distress.    Appearance: Normal appearance. She is well-developed. She is not ill-appearing.  HENT:     Head: Normocephalic and atraumatic.     Right Ear: Tympanic membrane, ear canal and external ear normal.     Left Ear: Tympanic membrane, ear canal and external ear normal.     Nose: Nose normal.  Eyes:     Extraocular Movements: Extraocular movements intact.     Pupils: Pupils are equal, round, and reactive to light.     Comments: No nystagmus  Neck:     Thyroid: No thyromegaly.     Vascular: No JVD.  Cardiovascular:  Rate and Rhythm: Normal rate and regular rhythm.     Pulses: Normal pulses.     Heart sounds: Normal heart sounds. No murmur heard.   Pulmonary:     Effort: Pulmonary effort is normal. No respiratory distress.     Breath sounds: Normal breath sounds. No wheezing, rhonchi or rales.  Abdominal:     General: Bowel sounds are normal. There is no distension.     Palpations: Abdomen is soft. There is no hepatomegaly, splenomegaly or mass.     Tenderness: There is no  abdominal tenderness.  Genitourinary:    Vagina: Normal.  Musculoskeletal:        General: No tenderness. Normal range of motion.     Cervical back: Normal range of motion and neck supple.     Right lower leg: No edema.     Left lower leg: No edema.     Comments: 5/5 strength in the LE and UE  Skin:    General: Skin is warm and dry.  Neurological:     Mental Status: She is alert and oriented to person, place, and time.     Sensory: Sensation is intact. No sensory deficit.     Motor: Motor function is intact.     Deep Tendon Reflexes: Reflexes are normal and symmetric.  Psychiatric:        Behavior: Behavior normal.        Thought Content: Thought content normal.    BP 102/64   Pulse 83   Temp 97.7 F (36.5 C)   Resp 16   Ht 5\' 5"  (1.651 m)   Wt 150 lb 6.4 oz (68.2 kg)   LMP 03/28/2013   SpO2 98%   BMI 25.03 kg/m  Wt Readings from Last 3 Encounters:  11/02/20 150 lb 6.4 oz (68.2 kg)  01/05/15 153 lb (69.4 kg)  09/10/13 158 lb (71.7 kg)     Lab Results  Component Value Date   WBC 4.7 11/04/2019   HGB 12.1 11/04/2019   HCT 36.0 11/04/2019   PLT 232.0 11/04/2019   GLUCOSE 116 (H) 11/04/2019   CHOL 198 11/04/2019   TRIG 336.0 (H) 11/04/2019   HDL 44.80 11/04/2019   LDLDIRECT 103.0 11/04/2019   LDLCALC 89 01/05/2015   ALT 13 11/04/2019   AST 14 11/04/2019   NA 139 11/04/2019   K 3.9 11/04/2019   CL 103 11/04/2019   CREATININE 0.89 11/04/2019   BUN 14 11/04/2019   CO2 30 11/04/2019   TSH 0.72 11/04/2019    MM Digital Diagnostic Unilat R  Result Date: 02/22/2012 *RADIOLOGY REPORT* Clinical Data:  Further evaluation of right asymmetry DIGITAL DIAGNOSTIC RIGHT MAMMOGRAM Comparison:  07/01/2008,  12/08/2009, 02/07/2012 Findings:  No persistent abnormalities on spot compression views. Loosely clustered punctate calcifications upper outer quadrant posteriorly seen incidentally, completely stable when compared to all prior studies.  Normal Mammographic images were  processed with CAD. IMPRESSION: No evidence of mass.  Stable benign calcifications. BI-RADS CATEGORY 2:  Benign finding(s). RECOMMENDATION: Recommendation: Return to annual screening mammography. Original Report Authenticated By: [email or web address]    Assessment & Plan:  Plan    No orders of the defined types were placed in this encounter.   Problem List Items Addressed This Visit    Preventative health care    Patient encouraged to maintain heart healthy diet, regular exercise, adequate sleep. Consider daily probiotics. Take medications as prescribed. Labs ordered and reviewed. Sees GYN with for paps, MGM, dexa scan will request records. She declines  Shingrix shots. Did have all 3 covid shots, she will bring in record      Relevant Orders   TSH   Vitamin B12   Osteoporosis    Encouraged to get adequate exercise, calcium and vitamin d intake      Hyperlipidemia - Primary    Encouraged heart healthy diet, increase exercise, avoid trans fats, consider a krill oil cap daily      Relevant Orders   Comprehensive metabolic panel   Lipid panel   Vitamin D deficiency    Supplement and monitor      Relevant Orders   Vitamin D 1,25 dihydroxy   Anemia    Historical when she was younger check labs today      Relevant Orders   CBC with Differential/Platelet   Vitamin B12      Colonoscopy: Last completed on 09/10/2013, results were , repeat every   Mammo: Last completed on 02/22/2012 , results were normal  Pap Smear: Last completed on 10/21/2014 , results were normal, repeat every 3 years.  Dexa-Scan will be sent by health facility, but the patient states that she was dx with osteoporosis.   Follow-up: Return in about 1 year (around 11/02/2021) for annual exam.  I,Delfin Squillace,acting as a scribe for Penni Homans, MD.,have documented all relevant documentation on the behalf of Penni Homans, MD,as directed by  Penni Homans, MD while in the presence of Penni Homans, MD.  Medical screening  examination/treatment was performed by qualified clinical staff member and as supervising physician I was immediately available for consultation/collaboration. I have reviewed documentation and agree with assessment and plan.  Penni Homans, MD

## 2020-11-02 NOTE — Assessment & Plan Note (Signed)
Patient encouraged to maintain heart healthy diet, regular exercise, adequate sleep. Consider daily probiotics. Take medications as prescribed. Labs ordered and reviewed. Sees GYN with for paps, MGM, dexa scan will request records. She declines Shingrix shots. Did have all 3 covid shots, she will bring in record

## 2020-11-02 NOTE — Assessment & Plan Note (Signed)
Historical when she was younger check labs today

## 2020-11-02 NOTE — Assessment & Plan Note (Signed)
Encouraged heart healthy diet, increase exercise, avoid trans fats, consider a krill oil cap daily 

## 2020-11-02 NOTE — Assessment & Plan Note (Signed)
Supplement and monitor 

## 2020-11-06 LAB — VITAMIN D 1,25 DIHYDROXY
Vitamin D 1, 25 (OH)2 Total: 69 pg/mL (ref 18–72)
Vitamin D2 1, 25 (OH)2: <8 pg/mL
Vitamin D3 1, 25 (OH)2: 69 pg/mL

## 2020-11-08 ENCOUNTER — Encounter: Payer: Self-pay | Admitting: *Deleted

## 2020-11-10 ENCOUNTER — Telehealth: Payer: Self-pay | Admitting: Family Medicine

## 2020-11-10 NOTE — Telephone Encounter (Signed)
Pt dropped off copy of Covid immunization to have on file. Pt wanted to have PCP informed. Document put at front office tray.

## 2020-11-10 NOTE — Telephone Encounter (Signed)
Put in chart

## 2021-01-23 ENCOUNTER — Encounter: Payer: Self-pay | Admitting: Family Medicine

## 2021-01-24 ENCOUNTER — Other Ambulatory Visit: Payer: Self-pay | Admitting: Family Medicine

## 2021-01-24 DIAGNOSIS — K921 Melena: Secondary | ICD-10-CM

## 2021-01-25 ENCOUNTER — Other Ambulatory Visit (INDEPENDENT_AMBULATORY_CARE_PROVIDER_SITE_OTHER): Payer: Managed Care, Other (non HMO)

## 2021-01-25 ENCOUNTER — Encounter: Payer: Self-pay | Admitting: Nurse Practitioner

## 2021-01-25 ENCOUNTER — Other Ambulatory Visit: Payer: Self-pay

## 2021-01-25 DIAGNOSIS — K921 Melena: Secondary | ICD-10-CM

## 2021-01-25 LAB — CBC WITH DIFFERENTIAL/PLATELET
Basophils Absolute: 0 10*3/uL (ref 0.0–0.1)
Basophils Relative: 0.9 % (ref 0.0–3.0)
Eosinophils Absolute: 0.1 10*3/uL (ref 0.0–0.7)
Eosinophils Relative: 2.4 % (ref 0.0–5.0)
HCT: 38 % (ref 36.0–46.0)
Hemoglobin: 12.7 g/dL (ref 12.0–15.0)
Lymphocytes Relative: 28 % (ref 12.0–46.0)
Lymphs Abs: 1.2 10*3/uL (ref 0.7–4.0)
MCHC: 33.4 g/dL (ref 30.0–36.0)
MCV: 91.5 fl (ref 78.0–100.0)
Monocytes Absolute: 0.4 10*3/uL (ref 0.1–1.0)
Monocytes Relative: 8.5 % (ref 3.0–12.0)
Neutro Abs: 2.5 10*3/uL (ref 1.4–7.7)
Neutrophils Relative %: 60.2 % (ref 43.0–77.0)
Platelets: 254 10*3/uL (ref 150.0–400.0)
RBC: 4.16 Mil/uL (ref 3.87–5.11)
RDW: 14 % (ref 11.5–15.5)
WBC: 4.1 10*3/uL (ref 4.0–10.5)

## 2021-02-01 ENCOUNTER — Other Ambulatory Visit: Payer: Self-pay

## 2021-02-20 NOTE — Progress Notes (Signed)
02/20/2021 Joan Owen [id number] February 27, 1956   CHIEF COMPLAINT: Rectal bleeding  HISTORY OF PRESENT ILLNESS:  Joan Owen is a 65 year old female with a past medical history of osteoporosis, positive Covid 19 test 07/2019, facial basal cell cancer and a hyperplastic colon polyp.  S/P laparoscopy 1985 secondary to endometriosis and cholecystectomy 1980. She presents to our office today as referred by Dr. Penni Homans to schedule a colonoscopy due to having blood in the stool.  Approximately 3 months ago, she started seeing a small amount of red blood on the stool every 3 to 4 days and for the past month has occurred daily.  Two months ago, she passed a small amount of blood clots mixed with mucus which was seen on the toilet tissue and in the toilet bowl after passing a normal bowel movement.  She typically passes a normal formed brown bowel movement daily, however, for the past month she has noticed the diameter of her stool is slightly more narrow.  She continues to pass a small amount of mucus with small blood clots most days.  No associated anorectal pain.  No tenesmus.  No upper or lower abdominal pain.  No fever, sweats or chills.  No new medications within the past 6 months.  She is eating a healthy high-fiber diet. She underwent a colonoscopy by Dr. Olevia Perches on 09/10/2013 and a 3-23mm hyperplastic polyp was removed from the sigmoid colon. At that time, she was advised to repeat a colonoscopy in 10 years.  No known family history of IBD, esophageal, gastric or colon cancer.  Infrequent NSAID use.  No other complaints at this time.   CBC Latest Ref Rng & Units 01/25/2021 11/02/2020 11/04/2019  WBC 4.0 - 10.5 K/uL 4.1 4.0 4.7  Hemoglobin 12.0 - 15.0 g/dL 12.7 13.2 12.1  Hematocrit 36.0 - 46.0 % 38.0 39.4 36.0  Platelets 150.0 - 400.0 K/uL 254.0 255.0 232.0    CMP Latest Ref Rng & Units 11/02/2020 11/04/2019 01/05/2015  Glucose 70 - 99 mg/dL 84 116(H) 97  BUN 6 - 23 mg/dL 25(H) 14 14  Creatinine  0.40 - 1.20 mg/dL 0.80 0.89 0.78  Sodium 135 - 145 mEq/L 139 139 139  Potassium 3.5 - 5.1 mEq/L 4.8 3.9 4.0  Chloride 96 - 112 mEq/L 102 103 104  CO2 19 - 32 mEq/L 31 30 32  Calcium 8.4 - 10.5 mg/dL 10.0 9.1 9.3  Total Protein 6.0 - 8.3 g/dL 7.0 6.6 7.1  Total Bilirubin 0.2 - 1.2 mg/dL 1.0 0.6 0.7  Alkaline Phos 39 - 117 U/L 72 67 68  AST 0 - 37 U/L 14 14 16   ALT 0 - 35 U/L 12 13 14      Past Surgical History:  Procedure Laterality Date   [place]  She denies any problems with sedation/anesthesia or airway management/intubation with past procedures or surgeries.  Social History: She is married.  She has 2 daughters and 1 son.  She is Futures trader.  Non-smoker.  She drinks 3 to 4 glasses of wine on the weekends.  No drug use.    Family History: Mother history of Alzheimer's disease.  Sister with history of Alzheimer's disease.  Father with history of heart disease.  No known family history of esophageal, gastric or colon cancer.  No Known Allergies   Outpatient Encounter Medications as of 02/21/2021  Medication Sig   Calcium Citrate 250 MG  TABS Take by mouth.   Cholecalciferol (VITAMIN D-3 PO) Take by mouth.   COVID-19 mRNA vaccine, Pfizer, 30 MCG/0.3ML injection INJECT AS DIRECTED   No facility-administered encounter medications on file as of 02/21/2021.    REVIEW OF SYSTEMS:  Gen: Denies fever, sweats or chills. No weight loss.  CV: Denies chest pain, palpitations or edema. Resp: Denies cough, shortness of breath of hemoptysis.  GI: See HPI. GU : + Blood in urine.  MS: Denies joint pain, muscles aches or weakness. Derm: Denies rash, itchiness, skin lesions or unhealing ulcers. Psych: Denies depression, anxiety or memory loss. Heme: Denies bruising, bleeding. Neuro:  Denies headaches, dizziness or paresthesias. Endo:  Denies any problems with DM, thyroid or adrenal function.   PHYSICAL EXAM:  BP  120/70   Pulse 69   Ht 5\' 4"  (1.626 m)   Wt 149 lb (67.6 kg)   LMP 03/28/2013   BMI 25.58 kg/m  LMP 03/28/2013   General: 65 year old female in no acute distress. Head: Normocephalic and atraumatic. Eyes:  Sclerae non-icteric, conjunctive pink. Ears: Normal auditory acuity. Mouth: Dentition intact. No ulcers or lesions.  Neck: Supple, no lymphadenopathy or thyromegaly.  Lungs: Clear bilaterally to auscultation without wheezes, crackles or rhonchi. Heart: Regular rate and rhythm. No murmur, rub or gallop appreciated.  Abdomen: Soft, nontender, non distended. No masses. No hepatosplenomegaly. Normoactive bowel sounds x 4 quadrants.  Rectal: Small anterior external hemorrhoid out inflammation or active bleeding, posterior anal tag with a minute area with blood out an obvious fissure.  Right posterior internal hemorrhoid palpated without prolapse.  No stool or mass in the rectal vault.  CMA Melissa present during exam.  Musculoskeletal: Symmetrical with no gross deformities. Skin: Warm and dry. No rash or lesions on visible extremities. Extremities: No edema. Neurological: Alert oriented x 4, no focal deficits.  Psychological:  Alert and cooperative. Normal mood and affect.  ASSESSMENT AND PLAN:  24.  65 year old female with red blood per the rectum, small dark red blood clots with mucus per the rectum.  Rectal exam today showed a small anterior external hemorrhoid and posterior skin tags without obvious anal fissure.  Change in bowel pattern, narrow stools. -Colonoscopy benefits and risks discussed including risk with sedation, risk of bleeding, perforation and infection  -Apply a small amount of Desitin inside the anal opening and to the external anal area tid as needed for anal or hemorrhoidal irritation/bleeding.  -Benefiber 1 tablespoon daily as tolerated -Patient to call our office if her symptoms worsen -Further recommendations to be determined after colonoscopy completed  2.   History of a hyperplastic colon polyp per colonoscopy in 2015. -See plan in #1        CC:  [name], MD

## 2021-02-21 ENCOUNTER — Ambulatory Visit (INDEPENDENT_AMBULATORY_CARE_PROVIDER_SITE_OTHER): Payer: Managed Care, Other (non HMO) | Admitting: Nurse Practitioner

## 2021-02-21 ENCOUNTER — Encounter: Payer: Self-pay | Admitting: Nurse Practitioner

## 2021-02-21 VITALS — BP 120/70 | HR 69 | Ht 64.0 in | Wt 149.0 lb

## 2021-02-21 DIAGNOSIS — K644 Residual hemorrhoidal skin tags: Secondary | ICD-10-CM

## 2021-02-21 DIAGNOSIS — K648 Other hemorrhoids: Secondary | ICD-10-CM

## 2021-02-21 DIAGNOSIS — K625 Hemorrhage of anus and rectum: Secondary | ICD-10-CM | POA: Diagnosis not present

## 2021-02-21 MED ORDER — SUTAB 1479-225-188 MG PO TABS
1.0000 | ORAL_TABLET | ORAL | 0 refills | Status: DC
Start: 2021-02-21 — End: 2021-03-24

## 2021-02-21 NOTE — Patient Instructions (Signed)
If you are age 64 or younger, your body mass index should be between 19-25. Your Body mass index is 25.58 kg/m. If this is out of the aformentioned range listed, please consider follow up with your Primary Care Provider.   PROCEDURES: You have been scheduled for a colonoscopy. Please follow the written instructions given to you at your visit today. Please pick up your prep supplies at the pharmacy within the next 1-3 days. If you use inhalers (even only as needed), please bring them with you on the day of your procedure.  RECOMMENDATIONS: Benefiber- 1 tablespoon daily to bulk up stool. Miralax- Dissolve one capful in 8 ounces of water and drink before bed to increase stool output if needed. Desitin: Apply a small amount to the external anal area three times a day as needed.  Please call our office if your symptoms worsen. It was great seeing you today! Thank you for entrusting me with your care and choosing [facility].  Noralyn Pick, CRNP  The [facility] providers would like to encourage you to use [facility] to communicate with providers for non-urgent requests or questions.  Due to long hold times on the telephone, sending your provider a message by [facility] may be faster and more efficient way to get a response. Please allow 48 business hours for a response.  Please remember that this is for non-urgent requests/questions.

## 2021-03-11 NOTE — Progress Notes (Signed)
Addendum: Reviewed and agree with assessment and management plan. Kaleo Condrey M, MD  

## 2021-03-22 ENCOUNTER — Encounter: Payer: Self-pay | Admitting: Internal Medicine

## 2021-03-24 ENCOUNTER — Encounter: Payer: Self-pay | Admitting: Internal Medicine

## 2021-03-24 ENCOUNTER — Telehealth: Payer: Self-pay

## 2021-03-24 ENCOUNTER — Ambulatory Visit (AMBULATORY_SURGERY_CENTER): Payer: Managed Care, Other (non HMO) | Admitting: Internal Medicine

## 2021-03-24 ENCOUNTER — Other Ambulatory Visit: Payer: Self-pay

## 2021-03-24 ENCOUNTER — Other Ambulatory Visit (INDEPENDENT_AMBULATORY_CARE_PROVIDER_SITE_OTHER): Payer: Managed Care, Other (non HMO)

## 2021-03-24 VITALS — BP 118/67 | HR 78 | Temp 97.5°F | Resp 19 | Ht 64.0 in | Wt 149.0 lb

## 2021-03-24 DIAGNOSIS — D123 Benign neoplasm of transverse colon: Secondary | ICD-10-CM | POA: Diagnosis not present

## 2021-03-24 DIAGNOSIS — K648 Other hemorrhoids: Secondary | ICD-10-CM | POA: Diagnosis not present

## 2021-03-24 DIAGNOSIS — K625 Hemorrhage of anus and rectum: Secondary | ICD-10-CM

## 2021-03-24 DIAGNOSIS — C187 Malignant neoplasm of sigmoid colon: Secondary | ICD-10-CM

## 2021-03-24 DIAGNOSIS — K6389 Other specified diseases of intestine: Secondary | ICD-10-CM

## 2021-03-24 DIAGNOSIS — R194 Change in bowel habit: Secondary | ICD-10-CM | POA: Diagnosis not present

## 2021-03-24 DIAGNOSIS — D128 Benign neoplasm of rectum: Secondary | ICD-10-CM

## 2021-03-24 DIAGNOSIS — K644 Residual hemorrhoidal skin tags: Secondary | ICD-10-CM | POA: Diagnosis not present

## 2021-03-24 LAB — CBC WITH DIFFERENTIAL/PLATELET
Basophils Absolute: 0 10*3/uL (ref 0.0–0.1)
Basophils Relative: 0.6 % (ref 0.0–3.0)
Eosinophils Absolute: 0.1 10*3/uL (ref 0.0–0.7)
Eosinophils Relative: 1.5 % (ref 0.0–5.0)
HCT: 38.2 % (ref 36.0–46.0)
Hemoglobin: 12.6 g/dL (ref 12.0–15.0)
Lymphocytes Relative: 24.5 % (ref 12.0–46.0)
Lymphs Abs: 1.2 10*3/uL (ref 0.7–4.0)
MCHC: 32.9 g/dL (ref 30.0–36.0)
MCV: 91 fl (ref 78.0–100.0)
Monocytes Absolute: 0.3 10*3/uL (ref 0.1–1.0)
Monocytes Relative: 6.4 % (ref 3.0–12.0)
Neutro Abs: 3.2 10*3/uL (ref 1.4–7.7)
Neutrophils Relative %: 67 % (ref 43.0–77.0)
Platelets: 327 10*3/uL (ref 150.0–400.0)
RBC: 4.2 Mil/uL (ref 3.87–5.11)
RDW: 13.2 % (ref 11.5–15.5)
WBC: 4.9 10*3/uL (ref 4.0–10.5)

## 2021-03-24 LAB — COMPREHENSIVE METABOLIC PANEL
ALT: 17 U/L (ref 0–35)
AST: 16 U/L (ref 0–37)
Albumin: 4.1 g/dL (ref 3.5–5.2)
Alkaline Phosphatase: 70 U/L (ref 39–117)
BUN: 8 mg/dL (ref 6–23)
CO2: 27 mEq/L (ref 19–32)
Calcium: 8.7 mg/dL (ref 8.4–10.5)
Chloride: 104 mEq/L (ref 96–112)
Creatinine, Ser: 0.69 mg/dL (ref 0.40–1.20)
GFR: 92.07 mL/min (ref >60.00)
Glucose, Bld: 90 mg/dL (ref 70–99)
Potassium: 3.8 mEq/L (ref 3.5–5.1)
Sodium: 139 mEq/L (ref 135–145)
Total Bilirubin: 0.8 mg/dL (ref 0.2–1.2)
Total Protein: 7.1 g/dL (ref 6.0–8.3)

## 2021-03-24 MED ORDER — SODIUM CHLORIDE 0.9 % IV SOLN: 500.0000 mL | Freq: Once | INTRAVENOUS | Status: DC

## 2021-03-24 NOTE — Op Note (Signed)
[place] Patient Name: Joan Owen Procedure Date: 03/24/2021 1:14 PM MRN: [id number] Endoscopist: Jerene Bears , MD Age: 65 Referring MD:  Date of Birth: 02/17/1956 Gender: Female Account #: [id number] Procedure:                Colonoscopy Indications:              Rectal bleeding, Change in stool caliber Medicines:                Monitored Anesthesia Care Procedure:                Pre-Anesthesia Assessment:                           - Prior to the procedure, a History and Physical                            was performed, and patient medications and                            allergies were reviewed. The patient's tolerance of                            previous anesthesia was also reviewed. The risks                            and benefits of the procedure and the sedation                            options and risks were discussed with the patient.                            All questions were answered, and informed consent                            was obtained. Prior Anticoagulants: The patient has                            taken no previous anticoagulant or antiplatelet                            agents. ASA Grade Assessment: II - A patient with                            mild systemic disease. After reviewing the risks                            and benefits, the patient was deemed in                            satisfactory condition to undergo the procedure.                           After obtaining informed consent, the colonoscope  was passed under direct vision. Throughout the                            procedure, the patient's blood pressure, pulse, and                            oxygen saturations were monitored continuously. The                            PCF-HQ190L Colonoscope was introduced through the                            anus and advanced to the cecum, identified by                            appendiceal orifice and  ileocecal valve. The                            colonoscopy was performed without difficulty. The                            patient tolerated the procedure well. The quality                            of the bowel preparation was good. The ileocecal                            valve, appendiceal orifice, and rectum were                            photographed. Scope In: 1:24:31 PM Scope Out: 1:55:46 PM Scope Withdrawal Time: 0 hours 24 minutes 0 seconds  Total Procedure Duration: 0 hours 31 minutes 15 seconds  Findings:                 The digital rectal exam was normal.                           A fungating partially obstructing (scope about to                            traverse but with irrigation as the lumen in                            narrowed) large mass was found in the distal                            sigmoid colon. The mass was circumferential. The                            mass measured 4 cm in length. Oozing was present.                            Multiple biopsies were obtained with cold forceps  for histology. Area distal to the mass was tattooed                            with an injection of 4 mL of Spot (carbon black).                            Tattoo placed on opposite colonic walls.                           A 6 mm polyp was found in the transverse colon. The                            polyp was sessile. The polyp was removed with a                            cold snare. Resection and retrieval were complete.                           A 4 mm polyp was found in the rectum. The polyp was                            sessile. The polyp was removed with a cold snare.                            Resection and retrieval were complete.                           Multiple small and large-mouthed diverticula were                            found in the sigmoid colon and distal descending                            colon.                            Internal hemorrhoids were found during                            retroflexion. The hemorrhoids were small. Complications:            No immediate complications. Estimated Blood Loss:     Estimated blood loss was minimal. Impression:               - Malignant partially obstructing tumor in the                            distal sigmoid colon. Biopsied. Tattooed.                           - One 6 mm polyp in the transverse colon, removed                            with a cold snare. Resected  and retrieved.                           - One 4 mm polyp in the rectum, removed with a cold                            snare. Resected and retrieved.                           - Diverticulosis in the sigmoid colon and in the                            distal descending colon.                           - Small internal hemorrhoids. Recommendation:           - Patient has a contact number available for                            emergencies. The signs and symptoms of potential                            delayed complications were discussed with the                            patient. Return to normal activities tomorrow.                            Written discharge instructions were provided to the                            patient.                           - Resume previous diet.                           - Continue present medications.                           - Await pathology results. Specimen sent rush.                           - Repeat colonoscopy in 1 year for surveillance                            based on pathology results.                           - Perform a CT scan (computed tomography) of chest                            with contrast, abdomen with contrast and pelvis  with contrast at appointment to be scheduled.                           - Lab work today on the way home.                           - Refer to a colo-rectal surgeon at appointment to                             be scheduled. Jerene Bears, MD 03/24/2021 2:09:05 PM This report has been signed electronically.

## 2021-03-24 NOTE — Progress Notes (Signed)
Spoke with Richardson Landry from the GI office, Richardson Landry has schedule patient's CT scan for 03/31/21 at 0830 at [facility]. Patient given 2 bottles of ReadyCat oral contrast media with instructions which are also printed on her AVS.

## 2021-03-24 NOTE — Patient Instructions (Addendum)
YOU HAD AN ENDOSCOPIC PROCEDURE TODAY AT [facility]:   Refer to the procedure report that was given to you for any specific questions about what was found during the examination.  If the procedure report does not answer your questions, please call your gastroenterologist to clarify.  If you requested that your care partner not be given the details of your procedure findings, then the procedure report has been included in a sealed envelope for you to review at your convenience later.  YOU SHOULD EXPECT: Some feelings of bloating in the abdomen. Passage of more gas than usual.  Walking can help get rid of the air that was put into your GI tract during the procedure and reduce the bloating. If you had a lower endoscopy (such as a colonoscopy or flexible sigmoidoscopy) you may notice spotting of blood in your stool or on the toilet paper. If you underwent a bowel prep for your procedure, you may not have a normal bowel movement for a few days.  Please Note:  You might notice some irritation and congestion in your nose or some drainage.  This is from the oxygen used during your procedure.  There is no need for concern and it should clear up in a day or so.  SYMPTOMS TO REPORT IMMEDIATELY:  Following lower endoscopy (colonoscopy or flexible sigmoidoscopy):  Excessive amounts of blood in the stool  Significant tenderness or worsening of abdominal pains  Swelling of the abdomen that is new, acute  Fever of 100F or higher  For urgent or emergent issues, a gastroenterologist can be reached at any hour by calling [phone]. Do not use MyChart messaging for urgent concerns.    DIET:  We do recommend a small meal at first, but then you may proceed to your regular diet.  Drink plenty of fluids but you should avoid alcoholic beverages for 24 hours.  MEDICATIONS: Continue present medications.  FOLLOW UP: Perform CT scan (computed tomography) of chest and contrast, abdomen with contrast  and pelvis with contrast. We have scheduled an appointment for you on 03/31/2021 at [facility] [place]. Please drink 1st bottle of contrast at 0630 and 2nd bottle of oral contrast at 0730 and arrive at 0815 for your appointment.   We will take you to the laboratory to have blood drawn prior to discharge today.  Dr. Hilarie Fredrickson will refer you to a colo-rectal surgeon at an appointment to be scheduled. Dr. Vena Rua office nurse will call you to set up this appointment.  Please see handouts given to you by your recovery nurse.  Thank you for allowing [place] to provide for your healthcare needs today.  ACTIVITY:  You should plan to take it easy for the rest of today and you should NOT DRIVE or use heavy machinery until tomorrow (because of the sedation medicines used during the test).    FOLLOW UP: Our staff will call the number listed on your records 48-72 hours following your procedure to check on you and address any questions or concerns that you may have regarding the information given to you following your procedure. If we do not reach you, we will leave a message.  We will attempt to reach you two times.  During this call, we will ask if you have developed any symptoms of COVID 19. If you develop any symptoms (ie: fever, flu-like symptoms, shortness of breath, cough etc.) before then, please call [phone].  If you test positive for Covid 19 in the 2  weeks post procedure, please call and report this information to [place].    If any biopsies were taken you will be contacted by phone or by letter within the next 1-3 weeks.  Please call us at [phone] if you have not heard about the biopsies in 3 weeks.    SIGNATURES/CONFIDENTIALITY: You and/or your care partner have signed paperwork which will be entered into your electronic medical record.  These signatures attest to the fact that that the information above on your After Visit Summary has been reviewed and is understood.  Full  responsibility of the confidentiality of this discharge information lies with you and/or your care-partner.

## 2021-03-24 NOTE — Progress Notes (Signed)
Called to room to assist during endoscopic procedure.  Patient ID and intended procedure confirmed with present staff. Received instructions for my participation in the procedure from the performing physician.  

## 2021-03-24 NOTE — Telephone Encounter (Signed)
CT abd/pelvis and chest, ASAP, colon tumor found today  Pt scheduled for CT of CAP at [facility] on 03/31/21 @ 08:30. Pt to arrive at 8:15. Pt to be NPO past midnight; Drink 1st bottle of contrast @ 0630 and the second bottle @ 0730. Jani Gravel RN in the [facility] will give pt contrast and instructions.   Order in for Dr [facility]

## 2021-03-24 NOTE — Progress Notes (Signed)
Pt's states no medical or surgical changes since previsit or office visit. VS assessed by D.B

## 2021-03-24 NOTE — Progress Notes (Signed)
PT taken to PACU. Monitors in place. VSS. Report given to RN. 

## 2021-03-25 ENCOUNTER — Encounter: Payer: Self-pay | Admitting: *Deleted

## 2021-03-25 ENCOUNTER — Other Ambulatory Visit: Payer: Self-pay

## 2021-03-25 ENCOUNTER — Encounter: Payer: Self-pay | Admitting: Internal Medicine

## 2021-03-25 DIAGNOSIS — C189 Malignant neoplasm of colon, unspecified: Secondary | ICD-10-CM

## 2021-03-25 LAB — CEA: CEA: 0.8 ng/mL

## 2021-03-25 NOTE — Progress Notes (Signed)
Reached out to [employer] to introduce myself as the office RN Navigator and explain our new patient process.   Patient is scheduled for CT 03/28/2021  Patient would like to have her CT done and speak to a surgeon before she agrees to see an oncologist. She expresses some concern because she felt like she was going to be referred to Dr Benay Spice. I explained that we could transfer the referral but at this time she doesn't want to proceed with scheduling with any oncologist. Will follow for outcomes of her CT and surgical appointment and reach back to out see what needs patient has.  Oncology Nurse Navigator Documentation  Oncology Nurse Navigator Flowsheets 03/25/2021  Abnormal Finding Date 03/24/2021  Confirmed Diagnosis Date 03/24/2021  Diagnosis Status Additional Work Up  Navigator Follow Up Date: 04/04/2021  Navigator Follow Up Reason: Appointment Review  Navigator Location [facility]  Referral Date to RadOnc/MedOnc 03/25/2021  Navigator Encounter Type Introductory Phone Call  Patient Visit Type MedOnc  Treatment Phase Pre-Tx/Tx Discussion  Barriers/Navigation Needs Coordination of Care;Education  Education Other  Interventions Coordination of Care;Education  Acuity Level 2-Minimal Needs (1-2 Barriers Identified)  Coordination of Care Appts  Education Method Verbal  Time Spent with Patient 80

## 2021-03-25 NOTE — Progress Notes (Signed)
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   I called the patient and spoke to her directly, her husband was also on the phone. We discussed the diagnosis. She is scheduled for CT scan of the chest, abdomen and pelvis next Thursday in the morning at [facility]. I will contact her with results later that day. She needs to be referred to colorectal surgery an d medical oncology. Please work on these referrals for appointments as soon as possible.  Time provided for questions and answers, she thanked me for the call. She will need a recall colonoscopy in one year.

## 2021-03-28 ENCOUNTER — Ambulatory Visit (HOSPITAL_COMMUNITY)
Admission: RE | Admit: 2021-03-28 | Discharge: 2021-03-28 | Disposition: A | Discharge status: Home / Self Care | Payer: Managed Care, Other (non HMO) | Source: Ambulatory Visit | Attending: Internal Medicine | Admitting: Internal Medicine

## 2021-03-28 ENCOUNTER — Other Ambulatory Visit: Payer: Self-pay

## 2021-03-28 ENCOUNTER — Telehealth: Payer: Self-pay

## 2021-03-28 DIAGNOSIS — K6389 Other specified diseases of intestine: Secondary | ICD-10-CM | POA: Diagnosis present

## 2021-03-28 MED ORDER — IOHEXOL 350 MG/ML SOLN
80.0000 mL | Freq: Once | INTRAVENOUS | Status: AC | PRN
  Administered 2021-03-28: 80 mL via INTRAVENOUS

## 2021-03-28 NOTE — Telephone Encounter (Signed)
  Follow up Call-  Call back number 03/24/2021  Post procedure Call Back phone  # [phone]  Permission to leave phone message Yes  Some recent data might be hidden     Patient questions:  Do you have a fever, pain , or abdominal swelling? No. Pain Score  0 *  Have you tolerated food without any problems? Yes.    Have you been able to return to your normal activities? Yes.    Do you have any questions about your discharge instructions: Diet   No. Medications  No. Follow up visit  No.  Do you have questions or concerns about your Care? No.  Actions: * If pain score is 4 or above: No action needed, pain <4.

## 2021-03-29 ENCOUNTER — Encounter: Payer: Self-pay | Admitting: *Deleted

## 2021-03-29 NOTE — Progress Notes (Signed)
Received request from Dr Hilarie Fredrickson to have patient scheduled with Dr Benay Spice. He spoke to the patient and she had some confusion last week about what was needed, and is now ready to schedule. Since she requests Dr Benay Spice, I have forwarded this request to Donella Stade the navigator at his location.   Oncology Nurse Navigator Documentation  Oncology Nurse Navigator Flowsheets 03/29/2021  Abnormal Finding Date -  Confirmed Diagnosis Date -  Diagnosis Status -  Navigator Follow Up Date: -  Navigator Follow Up Reason: -  Navigation Complete Date: 03/29/2021  Post Navigation: Continue to Follow Patient? No  Reason Not Navigating Patient: Seeking Care elsewhere  Navigator Location [facility]  Referral Date to RadOnc/MedOnc -  Navigator Encounter Type Appt/Treatment Plan Review  Patient Visit Type MedOnc  Treatment Phase Pre-Tx/Tx Discussion  Barriers/Navigation Needs -  Education -  Interventions Coordination of Care  Acuity Level 2-Minimal Needs (1-2 Barriers Identified)  Coordination of Care Other  Education Method -  Time Spent with Patient 15

## 2021-03-30 ENCOUNTER — Telehealth: Payer: Self-pay | Admitting: *Deleted

## 2021-03-30 NOTE — Telephone Encounter (Signed)
Notified patient that referral has been received and since her CT scan shows no metastasis, it would be in order to see surgeon first and see oncology 2 weeks after surgery. She verbalized understanding and agrees. Sees Dr. Leighton Ruff on [phone]. Informed her we will keep eye on surgical schedule and get her in afterwards.

## 2021-03-31 ENCOUNTER — Ambulatory Visit: Payer: Managed Care, Other (non HMO) | Admitting: Hematology & Oncology

## 2021-03-31 ENCOUNTER — Other Ambulatory Visit: Payer: Managed Care, Other (non HMO)

## 2021-03-31 ENCOUNTER — Ambulatory Visit (HOSPITAL_COMMUNITY): Payer: Managed Care, Other (non HMO)

## 2021-04-04 ENCOUNTER — Ambulatory Visit: Payer: Self-pay | Admitting: General Surgery

## 2021-04-04 NOTE — H&P (Signed)
REFERRING PHYSICIAN:  Pyrtle, Frances Maywood, MD  PROVIDER:  Monico Blitz, MD  MRN: [id number] DOB: November 10, 1955 DATE OF ENCOUNTER: 04/04/2021  Subjective   Chief Complaint: Colon Cancer     History of Present Illness: Joan Owen is a 65 y.o. female who is seen today as an office consultation at the request of Dr. Hilarie Fredrickson for evaluation of Colon Cancer Patient presented to GI with rectal bleeding.  Colonoscopy showed distal sigmoid mass.  Biopsied and tattooed distally.  2 other polyps, resected and removed.  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  CT chest, abd and pelvis showed distal sigmoid mass with no other signs of metastatic disease.  Patient is currently asymptomatic except for a small amount of rectal bleeding.  She denies any obstructive symptoms.     Review of Systems: A complete review of systems was obtained from the patient.  I have reviewed this information and discussed as appropriate with the patient.  See HPI as well for other ROS.   Medical History: Past Medical History:  Diagnosis Date   History of cancer     There is no problem list on file for this patient.   past surgical history: lap cholecystectomy and diagnostic laparoscopy, both in the 1980s   No Known Allergies  No current outpatient medications on file prior to visit.   No current facility-administered medications on file prior to visit.    Family History  Problem Relation Age of Onset   Colon cancer Mother    Coronary Artery Disease (Blocked arteries around heart) Father      Social History   Tobacco Use  Smoking Status Never Smoker  Smokeless Tobacco Never Used     Social History   Socioeconomic History   Marital status: Married  Tobacco Use   Smoking status: Never Smoker   Smokeless tobacco: Never Used  Scientific laboratory technician Use: Never used  Substance and Sexual Activity   Alcohol use: Yes   Drug use: Defer   Sexual activity: Defer     Objective:    Vitals:   04/04/21 0940  Pulse: 107  Temp: 36.7 C (98 F)  SpO2: 97%  Weight: 65.8 kg (145 lb)  Height: 162.6 cm ('5\' 4"'$ )     Exam Gen: NAD CV: RRR Lungs: CTA Abd: soft, non-tender, no masses palpated   Labs, Imaging and Diagnostic Testing: CT 03/29/21 IMPRESSION: 1. Asymmetric masslike thickening of a short segment of the sigmoid colon, reflecting patient's known primary sigmoid colonic neoplasm. 2. Prominent presacral and sigmoid mesentery lymph nodes measuring up to 4 mm, nonspecific but possibly reflecting local nodal disease involvement. 3. No definite evidence of distant metastatic disease in the chest, abdomen or pelvis. 4. Left intertrochanteric sclerotic lesion, with narrow zone of transition, favored to represent a benign osseous lesion such as a bone island or enchondroma. Consider attention on follow-up imaging versus further evaluation with nuclear medicine bone scan.  CEA 0.8  Assessment and Plan:  Diagnoses and all orders for this visit:  Malignant neoplasm of sigmoid colon (CMS-HCC) -     polyethylene glycol (MIRALAX) powder; Take 233.75 g by mouth once for 1 dose Take according to your procedure prep instructions. -     bisacodyL (DULCOLAX) 5 mg EC tablet; Take 4 tablets (20 mg total) by mouth once daily as needed for Constipation for up to 1 dose -     metroNIDAZOLE (FLAGYL) 500 MG tablet; Take 2 tablets (1,000 mg total)  by mouth 3 (three) times daily for 3 doses Take according to your procedure colon prep instructions -     neomycin 500 mg tablet; Take 2 tablets (1,000 mg total) by mouth 3 (three) times daily for 3 doses Take according to your procedure colon prep instructions  65 year old healthy female who presents to the office for evaluation of a newly diagnosed sigmoid colon cancer.  CT shows no signs of metastatic disease.  We have discussed a robotic assisted sigmoidectomy.  All questions were answered.  Patient agrees to  proceed with surgery.  The surgery and anatomy were described to the patient as well as the risks of surgery and the possible complications.  These include: Bleeding, deep abdominal infections and possible wound complications such as hernia and infection, damage to adjacent structures, leak of surgical connections, which can lead to other surgeries and possibly an ostomy, possible need for other procedures, such as abscess drains in radiology, possible prolonged hospital stay, possible diarrhea from removal of part of the colon, possible constipation from narcotics, possible bowel, bladder or sexual dysfunction if having rectal surgery, prolonged fatigue/weakness or appetite loss, possible early recurrence of of disease, possible complications of their medical problems such as heart disease or arrhythmias or lung problems, death (less than 1%). I believe the patient understands and wishes to proceed with the surgery.

## 2021-04-04 NOTE — H&P (View-Only) (Signed)
REFERRING PHYSICIAN:  Pyrtle, Frances Maywood, MD  PROVIDER:  Monico Blitz, MD  MRN: [id number] DOB: 1956-08-04 DATE OF ENCOUNTER: 04/04/2021  Subjective   Chief Complaint: Colon Cancer     History of Present Illness: Joan Owen is a 65 y.o. female who is seen today as an office consultation at the request of Dr. Hilarie Fredrickson for evaluation of Colon Cancer Patient presented to GI with rectal bleeding.  Colonoscopy showed distal sigmoid mass.  Biopsied and tattooed distally.  2 other polyps, resected and removed.  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  CT chest, abd and pelvis showed distal sigmoid mass with no other signs of metastatic disease.  Patient is currently asymptomatic except for a small amount of rectal bleeding.  She denies any obstructive symptoms.     Review of Systems: A complete review of systems was obtained from the patient.  I have reviewed this information and discussed as appropriate with the patient.  See HPI as well for other ROS.   Medical History: Past Medical History:  Diagnosis Date   History of cancer     There is no problem list on file for this patient.   past surgical history: lap cholecystectomy and diagnostic laparoscopy, both in the 1980s   No Known Allergies  No current outpatient medications on file prior to visit.   No current facility-administered medications on file prior to visit.    Family History  Problem Relation Age of Onset   Colon cancer Mother    Coronary Artery Disease (Blocked arteries around heart) Father      Social History   Tobacco Use  Smoking Status Never Smoker  Smokeless Tobacco Never Used     Social History   Socioeconomic History   Marital status: Married  Tobacco Use   Smoking status: Never Smoker   Smokeless tobacco: Never Used  Scientific laboratory technician Use: Never used  Substance and Sexual Activity   Alcohol use: Yes   Drug use: Defer   Sexual activity: Defer     Objective:    Vitals:   04/04/21 0940  Pulse: 107  Temp: 36.7 C (98 F)  SpO2: 97%  Weight: 65.8 kg (145 lb)  Height: 162.6 cm ('5\' 4"'$ )     Exam Gen: NAD CV: RRR Lungs: CTA Abd: soft, non-tender, no masses palpated   Labs, Imaging and Diagnostic Testing: CT 03/29/21 IMPRESSION: 1. Asymmetric masslike thickening of a short segment of the sigmoid colon, reflecting patient's known primary sigmoid colonic neoplasm. 2. Prominent presacral and sigmoid mesentery lymph nodes measuring up to 4 mm, nonspecific but possibly reflecting local nodal disease involvement. 3. No definite evidence of distant metastatic disease in the chest, abdomen or pelvis. 4. Left intertrochanteric sclerotic lesion, with narrow zone of transition, favored to represent a benign osseous lesion such as a bone island or enchondroma. Consider attention on follow-up imaging versus further evaluation with nuclear medicine bone scan.  CEA 0.8  Assessment and Plan:  Diagnoses and all orders for this visit:  Malignant neoplasm of sigmoid colon (CMS-HCC) -     polyethylene glycol (MIRALAX) powder; Take 233.75 g by mouth once for 1 dose Take according to your procedure prep instructions. -     bisacodyL (DULCOLAX) 5 mg EC tablet; Take 4 tablets (20 mg total) by mouth once daily as needed for Constipation for up to 1 dose -     metroNIDAZOLE (FLAGYL) 500 MG tablet; Take 2 tablets (1,000 mg total)  by mouth 3 (three) times daily for 3 doses Take according to your procedure colon prep instructions -     neomycin 500 mg tablet; Take 2 tablets (1,000 mg total) by mouth 3 (three) times daily for 3 doses Take according to your procedure colon prep instructions  65 year old healthy female who presents to the office for evaluation of a newly diagnosed sigmoid colon cancer.  CT shows no signs of metastatic disease.  We have discussed a robotic assisted sigmoidectomy.  All questions were answered.  Patient agrees to  proceed with surgery.  The surgery and anatomy were described to the patient as well as the risks of surgery and the possible complications.  These include: Bleeding, deep abdominal infections and possible wound complications such as hernia and infection, damage to adjacent structures, leak of surgical connections, which can lead to other surgeries and possibly an ostomy, possible need for other procedures, such as abscess drains in radiology, possible prolonged hospital stay, possible diarrhea from removal of part of the colon, possible constipation from narcotics, possible bowel, bladder or sexual dysfunction if having rectal surgery, prolonged fatigue/weakness or appetite loss, possible early recurrence of of disease, possible complications of their medical problems such as heart disease or arrhythmias or lung problems, death (less than 1%). I believe the patient understands and wishes to proceed with the surgery.

## 2021-04-07 ENCOUNTER — Other Ambulatory Visit: Payer: Self-pay

## 2021-04-07 DIAGNOSIS — C189 Malignant neoplasm of colon, unspecified: Secondary | ICD-10-CM

## 2021-04-13 ENCOUNTER — Other Ambulatory Visit (HOSPITAL_BASED_OUTPATIENT_CLINIC_OR_DEPARTMENT_OTHER): Payer: Self-pay

## 2021-04-13 MED ORDER — NEOMYCIN SULFATE 500 MG PO TABS
ORAL_TABLET | ORAL | 0 refills | Status: DC
  Filled 2021-04-13: qty 6, 3d supply, fill #0

## 2021-04-13 MED ORDER — METRONIDAZOLE 500 MG PO TABS
ORAL_TABLET | ORAL | 0 refills | Status: DC
  Filled 2021-04-13: qty 6, 3d supply, fill #0

## 2021-04-19 ENCOUNTER — Other Ambulatory Visit (HOSPITAL_COMMUNITY): Payer: Managed Care, Other (non HMO)

## 2021-04-19 NOTE — Patient Instructions (Addendum)
DUE TO COVID-19 ONLY ONE VISITOR IS ALLOWED TO COME WITH YOU AND STAY IN THE WAITING ROOM ONLY DURING PRE OP AND PROCEDURE.   **NO VISITORS ARE ALLOWED IN THE SHORT STAY AREA OR RECOVERY ROOM!!**  IF YOU WILL BE ADMITTED INTO THE HOSPITAL YOU ARE ALLOWED ONLY TWO SUPPORT PEOPLE DURING VISITATION HOURS ONLY (10AM -8PM)   The support person(s) may change daily. The support person(s) must pass our screening, gel in and out, and wear a mask at all times, including in the patient's room. Patients must also wear a mask when staff or their support person are in the room.  No visitors under the age of 58. Any visitor under the age of 61 must be accompanied by an adult.    COVID SWAB TESTING MUST BE COMPLETED ON:  04/27/21 **MUST PRESENT COMPLETED FORM AT TESTING SITE**    [place] (backside of the building) Open 8am-3pm. No appointment needed. You are not required to quarantine, however you are required to wear a well-fitted mask when you are out and around people not in your household.  Hand Hygiene often Do NOT share personal items Notify your provider if you are in close contact with someone who has COVID or you develop fever 100.4 or greater, new onset of sneezing, cough, sore throat, shortness of breath or body aches.       Your procedure is scheduled on: 04/29/21   Report to [facility] Main  Entrance    Report to admitting at 11:45 AM   Call this number if you have problems the morning of surgery [phone]   Follow diet instructions given to you for day before surgery.   May have liquids until 11:00 AM day of surgery  CLEAR LIQUID DIET  Foods Allowed                                                                     Foods Excluded  Water, Black Coffee and tea (no milk or creamer)           liquids that you cannot  Plain Jell-O in any flavor  (No red)                                    see through such as: Fruit ices (not with fruit pulp)                                             milk, soups, orange juice              Iced Popsicles (No red)                                                All solid food  Apple juices Sports drinks like Gatorade (No red) Lightly seasoned clear broth or consume(fat free) Sugar  Drink 2 Ensure drinks the night before surgery. Have finished by 10pm.  Complete one Ensure drink the morning of surgery 3 hours prior to scheduled surgery at 11:00 AM     The day of surgery:  Drink ONE (1) Pre-Surgery Clear Ensure by 11:00 am the morning of surgery. Drink in one sitting. Do not sip.  This drink was given to you during your hospital  pre-op appointment visit. Nothing else to drink after completing the  Pre-Surgery Clear Ensure.          If you have questions, please contact your surgeon's office.     Oral Hygiene is also important to reduce your risk of infection.                                    Remember - BRUSH YOUR TEETH THE MORNING OF SURGERY WITH YOUR REGULAR TOOTHPASTE   Take these medicines the morning of surgery with A SIP OF WATER: Flagyl, Neomycin.                               You may not have any metal on your body including hair pins, jewelry, and body piercing             Do not wear make-up, lotions, powders, perfumes, or deodorant  Do not wear nail polish including gel and S&S, artificial/acrylic nails, or any other type of covering on natural nails including finger and toenails. If you have artificial nails, gel coating, etc. that needs to be removed by a nail salon please have this removed prior to surgery or surgery may need to be canceled/ delayed if the surgeon/ anesthesia feels like they are unable to be safely monitored.   Do not shave  48 hours prior to surgery.    Do not bring valuables to the hospital. [place].   Contacts, dentures or bridgework may not be worn into surgery.   Bring  small overnight bag day of surgery.  Special Instructions: Bring a copy of your healthcare power of attorney and living will documents         the day of surgery if you haven't scanned them in before.   Please read over the following fact sheets you were given: IF YOU HAVE QUESTIONS ABOUT YOUR PRE OP INSTRUCTIONS PLEASE CALL [phone]- [facility] - Preparing for Surgery Before surgery, you can play an important role.  Because skin is not sterile, your skin needs to be as free of germs as possible.  You can reduce the number of germs on your skin by washing with CHG (chlorahexidine gluconate) soap before surgery.  CHG is an antiseptic cleaner which kills germs and bonds with the skin to continue killing germs even after washing. Please DO NOT use if you have an allergy to CHG or antibacterial soaps.  If your skin becomes reddened/irritated stop using the CHG and inform your nurse when you arrive at Short Stay. Do not shave (including legs and underarms) for at least 48 hours prior to the first CHG shower.  You may shave your face/neck.  Please follow these instructions carefully:  1.  Shower with CHG Soap the night before surgery and the  morning of surgery.  2.  If you choose to wash your hair, wash your hair first as usual with your normal  shampoo.  3.  After you shampoo, rinse your hair and body thoroughly to remove the shampoo.                             4.  Use CHG as you would any other liquid soap.  You can apply chg directly to the skin and wash.  Gently with a scrungie or clean washcloth.  5.  Apply the CHG Soap to your body ONLY FROM THE NECK DOWN.   Do   not use on face/ open                           Wound or open sores. Avoid contact with eyes, ears mouth and   genitals (private parts).                       Wash face,  Genitals (private parts) with your normal soap.             6.  Wash thoroughly, paying special attention to the area where your    surgery  will be  performed.  7.  Thoroughly rinse your body with warm water from the neck down.  8.  DO NOT shower/wash with your normal soap after using and rinsing off the CHG Soap.                9.  Pat yourself dry with a clean towel.            10.  Wear clean pajamas.            11.  Place clean sheets on your bed the night of your first shower and do not  sleep with pets. Day of Surgery : Do not apply any lotions/deodorants the morning of surgery.  Please wear clean clothes to the hospital/surgery center.  FAILURE TO FOLLOW THESE INSTRUCTIONS MAY RESULT IN THE CANCELLATION OF YOUR SURGERY  PATIENT SIGNATURE_________________________________  NURSE SIGNATURE__________________________________  ________________________________________________________________________   Adam Phenix  An incentive spirometer is a tool that can help keep your lungs clear and active. This tool measures how well you are filling your lungs with each breath. Taking long deep breaths may help reverse or decrease the chance of developing breathing (pulmonary) problems (especially infection) following: A long period of time when you are unable to move or be active. BEFORE THE PROCEDURE  If the spirometer includes an indicator to show your best effort, your nurse or respiratory therapist will set it to a desired goal. If possible, sit up straight or lean slightly forward. Try not to slouch. Hold the incentive spirometer in an upright position. INSTRUCTIONS FOR USE  Sit on the edge of your bed if possible, or sit up as far as you can in bed or on a chair. Hold the incentive spirometer in an upright position. Breathe out normally. Place the mouthpiece in your mouth and seal your lips tightly around it. Breathe in slowly and as deeply as possible, raising the piston or the ball toward the top of the column. Hold your breath for 3-5 seconds or for as long as possible. Allow the piston or ball to fall to the bottom of the  column.  Remove the mouthpiece from your mouth and breathe out normally. Rest for a few seconds and repeat Steps 1 through 7 at least 10 times every 1-2 hours when you are awake. Take your time and take a few normal breaths between deep breaths. The spirometer may include an indicator to show your best effort. Use the indicator as a goal to work toward during each repetition. After each set of 10 deep breaths, practice coughing to be sure your lungs are clear. If you have an incision (the cut made at the time of surgery), support your incision when coughing by placing a pillow or rolled up towels firmly against it. Once you are able to get out of bed, walk around indoors and cough well. You may stop using the incentive spirometer when instructed by your caregiver.  RISKS AND COMPLICATIONS Take your time so you do not get dizzy or light-headed. If you are in pain, you may need to take or ask for pain medication before doing incentive spirometry. It is harder to take a deep breath if you are having pain. AFTER USE Rest and breathe slowly and easily. It can be helpful to keep track of a log of your progress. Your caregiver can provide you with a simple table to help with this. If you are using the spirometer at home, follow these instructions: [place] IF:  You are having difficultly using the spirometer. You have trouble using the spirometer as often as instructed. Your pain medication is not giving enough relief while using the spirometer. You develop fever of 100.5 F (38.1 C) or higher. SEEK IMMEDIATE MEDICAL CARE IF:  You cough up bloody sputum that had not been present before. You develop fever of 102 F (38.9 C) or greater. You develop worsening pain at or near the incision site. MAKE SURE YOU:  Understand these instructions. Will watch your condition. Will get help right away if you are not doing well or get worse. Document Released: 12/11/2006 Document Revised: 10/23/2011  Document Reviewed: 02/11/2007 Exit[employer] Patient Information 2014 Exit[employer], [place].   ________________________________________________________________________   WHAT IS A BLOOD TRANSFUSION? Blood Transfusion Information  A transfusion is the replacement of blood or some of its parts. Blood is made up of multiple cells which provide different functions. Red blood cells carry oxygen and are used for blood loss replacement. White blood cells fight against infection. Platelets control bleeding. Plasma helps clot blood. Other blood products are available for specialized needs, such as hemophilia or other clotting disorders. BEFORE THE TRANSFUSION  Who gives blood for transfusions?  Healthy volunteers who are fully evaluated to make sure their blood is safe. This is blood bank blood. Transfusion therapy is the safest it has ever been in the practice of medicine. Before blood is taken from a donor, a complete history is taken to make sure that person has no history of diseases nor engages in risky social behavior (examples are intravenous drug use or sexual activity with multiple partners). The donor's travel history is screened to minimize risk of transmitting infections, such as malaria. The donated blood is tested for signs of infectious diseases, such as HIV and hepatitis. The blood is then tested to be sure it is compatible with you in order to minimize the chance of a transfusion reaction. If you or a relative donates blood, this is often done in anticipation of surgery and is not appropriate for emergency situations. It takes many days to process the donated blood. RISKS AND COMPLICATIONS Although transfusion  therapy is very safe and saves many lives, the main dangers of transfusion include:  Getting an infectious disease. Developing a transfusion reaction. This is an allergic reaction to something in the blood you were given. Every precaution is taken to prevent this. The decision to have a blood  transfusion has been considered carefully by your caregiver before blood is given. Blood is not given unless the benefits outweigh the risks. AFTER THE TRANSFUSION Right after receiving a blood transfusion, you will usually feel much better and more energetic. This is especially true if your red blood cells have gotten low (anemic). The transfusion raises the level of the red blood cells which carry oxygen, and this usually causes an energy increase. The nurse administering the transfusion will monitor you carefully for complications. HOME CARE INSTRUCTIONS  No special instructions are needed after a transfusion. You may find your energy is better. Speak with your caregiver about any limitations on activity for underlying diseases you may have. SEEK MEDICAL CARE IF:  Your condition is not improving after your transfusion. You develop redness or irritation at the intravenous (IV) site. SEEK IMMEDIATE MEDICAL CARE IF:  Any of the following symptoms occur over the next 12 hours: Shaking chills. You have a temperature by mouth above 102 F (38.9 C), not controlled by medicine. Chest, back, or muscle pain. People around you feel you are not acting correctly or are confused. Shortness of breath or difficulty breathing. Dizziness and fainting. You get a rash or develop hives. You have a decrease in urine output. Your urine turns a dark color or changes to pink, red, or brown. Any of the following symptoms occur over the next 10 days: You have a temperature by mouth above 102 F (38.9 C), not controlled by medicine. Shortness of breath. Weakness after normal activity. The white part of the eye turns yellow (jaundice). You have a decrease in the amount of urine or are urinating less often. Your urine turns a dark color or changes to pink, red, or brown. Document Released: 07/28/2000 Document Revised: 10/23/2011 Document Reviewed: 03/16/2008 [facility] Patient Information 2014 [place],  [place].  _______________________________________________________________________

## 2021-04-19 NOTE — Progress Notes (Addendum)
COVID swab appointment:04/27/21  COVID Vaccine Completed: yes x3 Date COVID Vaccine completed: 11/06/19, 12/01/19 Has received booster: 07/20/20 COVID vaccine manufacturer: Pfizer      Date of COVID positive in last 90 days: 03/19/21 home test  PCP - Penni Homans, MD Cardiologist - N/a  Chest x-ray - CT 03/28/21 Epic EKG - N/a Stress Test - N/a ECHO - N/a Cardiac Cath - N/a Pacemaker/ICD device last checked: N/a Spinal Cord Stimulator: N/a  Sleep Study - N/a CPAP -   Fasting Blood Sugar - N/a Checks Blood Sugar _____ times a day  Blood Thinner Instructions: N/a Aspirin Instructions: Last Dose:  Activity level: Can go up a flight of stairs and perform activities of daily living without stopping and without symptoms of chest pain or shortness of breath.       Anesthesia review:   Patient denies shortness of breath, fever, cough and chest pain at PAT appointment   Patient verbalized understanding of instructions that were given to them at the PAT appointment. Patient was also instructed that they will need to review over the PAT instructions again at home before surgery.

## 2021-04-20 ENCOUNTER — Encounter (HOSPITAL_COMMUNITY)
Admission: RE | Admit: 2021-04-20 | Discharge: 2021-04-20 | Disposition: A | Discharge status: Home / Self Care | Payer: Managed Care, Other (non HMO) | Source: Ambulatory Visit | Attending: General Surgery | Admitting: General Surgery

## 2021-04-20 ENCOUNTER — Encounter (HOSPITAL_COMMUNITY): Payer: Self-pay

## 2021-04-20 ENCOUNTER — Other Ambulatory Visit: Payer: Self-pay

## 2021-04-20 DIAGNOSIS — Z01812 Encounter for preprocedural laboratory examination: Secondary | ICD-10-CM | POA: Insufficient documentation

## 2021-04-20 HISTORY — DX: Other specified postprocedural states: Z98.890

## 2021-04-20 HISTORY — DX: Nausea with vomiting, unspecified: R11.2

## 2021-04-20 LAB — CBC
HCT: 38.8 % (ref 36.0–46.0)
Hemoglobin: 12.4 g/dL (ref 12.0–15.0)
MCH: 29.7 pg (ref 26.0–34.0)
MCHC: 32 g/dL (ref 30.0–36.0)
MCV: 92.8 fL (ref 80.0–100.0)
Platelets: 261 10*3/uL (ref 150–400)
RBC: 4.18 MIL/uL (ref 3.87–5.11)
RDW: 12.8 % (ref 11.5–15.5)
WBC: 4.5 10*3/uL (ref 4.0–10.5)
nRBC: 0 % (ref 0.0–0.2)

## 2021-04-20 LAB — BASIC METABOLIC PANEL
Anion gap: 8 (ref 5–15)
BUN: 15 mg/dL (ref 8–23)
CO2: 27 mmol/L (ref 22–32)
Calcium: 9.1 mg/dL (ref 8.9–10.3)
Chloride: 101 mmol/L (ref 98–111)
Creatinine, Ser: 0.69 mg/dL (ref 0.44–1.00)
GFR, Estimated: >60 mL/min (ref >60)
Glucose, Bld: 98 mg/dL (ref 70–99)
Potassium: 4.3 mmol/L (ref 3.5–5.1)
Sodium: 136 mmol/L (ref 135–145)

## 2021-04-27 ENCOUNTER — Other Ambulatory Visit: Payer: Self-pay | Admitting: Surgery

## 2021-04-27 LAB — SARS CORONAVIRUS 2 (TAT 6-24 HRS): SARS Coronavirus 2: NEGATIVE

## 2021-04-29 ENCOUNTER — Inpatient Hospital Stay (HOSPITAL_COMMUNITY): Payer: Managed Care, Other (non HMO) | Admitting: Certified Registered Nurse Anesthetist

## 2021-04-29 ENCOUNTER — Encounter (HOSPITAL_COMMUNITY)
Admission: RE | Disposition: A | Discharge status: Home / Self Care | Payer: Self-pay | Source: Home / Self Care | Attending: General Surgery

## 2021-04-29 ENCOUNTER — Other Ambulatory Visit: Payer: Self-pay

## 2021-04-29 ENCOUNTER — Inpatient Hospital Stay (HOSPITAL_COMMUNITY)
Admission: RE | Admit: 2021-04-29 | Discharge: 2021-05-01 | DRG: 331 | Disposition: A | Discharge status: Home / Self Care | Payer: Managed Care, Other (non HMO) | Attending: General Surgery | Admitting: General Surgery

## 2021-04-29 ENCOUNTER — Encounter (HOSPITAL_COMMUNITY): Payer: Self-pay | Admitting: General Surgery

## 2021-04-29 DIAGNOSIS — C189 Malignant neoplasm of colon, unspecified: Secondary | ICD-10-CM

## 2021-04-29 DIAGNOSIS — Z800 Family history of malignant neoplasm of digestive organs: Secondary | ICD-10-CM

## 2021-04-29 DIAGNOSIS — C187 Malignant neoplasm of sigmoid colon: Secondary | ICD-10-CM | POA: Diagnosis present

## 2021-04-29 DIAGNOSIS — Z85038 Personal history of other malignant neoplasm of large intestine: Secondary | ICD-10-CM | POA: Diagnosis present

## 2021-04-29 HISTORY — PX: COLECTOMY: SHX59

## 2021-04-29 HISTORY — DX: Malignant neoplasm of colon, unspecified: C18.9

## 2021-04-29 LAB — TYPE AND SCREEN
ABO/RH(D): A NEG
Antibody Screen: NEGATIVE

## 2021-04-29 LAB — ABO/RH: ABO/RH(D): A NEG

## 2021-04-29 SURGERY — COLECTOMY, PARTIAL, ROBOT-ASSISTED, LAPAROSCOPIC
Anesthesia: General | Site: Abdomen

## 2021-04-29 MED ORDER — BUPIVACAINE-EPINEPHRINE (PF) 0.25% -1:200000 IJ SOLN
INTRAMUSCULAR | Status: AC
  Filled 2021-04-29: qty 30

## 2021-04-29 MED ORDER — ARTIFICIAL TEARS OPHTHALMIC OINT
TOPICAL_OINTMENT | OPHTHALMIC | Status: AC
  Filled 2021-04-29: qty 3.5

## 2021-04-29 MED ORDER — SODIUM CHLORIDE 0.9 % IV SOLN
2.0000 g | Freq: Two times a day (BID) | INTRAVENOUS | Status: AC
  Administered 2021-04-30: 2 g via INTRAVENOUS
  Filled 2021-04-29: qty 2

## 2021-04-29 MED ORDER — PROMETHAZINE HCL 25 MG/ML IJ SOLN: 6.2500 mg | INTRAMUSCULAR | Status: DC | PRN

## 2021-04-29 MED ORDER — LIDOCAINE 2% (20 MG/ML) 5 ML SYRINGE
INTRAMUSCULAR | Status: DC | PRN
  Administered 2021-04-29: 60 mg via INTRAVENOUS

## 2021-04-29 MED ORDER — ALVIMOPAN 12 MG PO CAPS
12.0000 mg | ORAL_CAPSULE | ORAL | Status: AC
  Administered 2021-04-29: 12 mg via ORAL
  Filled 2021-04-29: qty 1

## 2021-04-29 MED ORDER — BUPIVACAINE LIPOSOME 1.3 % IJ SUSP: 20.0000 mL | Freq: Once | INTRAMUSCULAR | Status: DC

## 2021-04-29 MED ORDER — ENSURE PRE-SURGERY PO LIQD
592.0000 mL | Freq: Once | ORAL | Status: DC
  Filled 2021-04-29: qty 592

## 2021-04-29 MED ORDER — FENTANYL CITRATE (PF) 100 MCG/2ML IJ SOLN
INTRAMUSCULAR | Status: DC | PRN
  Administered 2021-04-29: 50 ug via INTRAVENOUS
  Administered 2021-04-29: 100 ug via INTRAVENOUS
  Administered 2021-04-29 (×2): 50 ug via INTRAVENOUS

## 2021-04-29 MED ORDER — ONDANSETRON HCL 4 MG/2ML IJ SOLN
INTRAMUSCULAR | Status: DC | PRN
  Administered 2021-04-29: 4 mg via INTRAVENOUS

## 2021-04-29 MED ORDER — SACCHAROMYCES BOULARDII 250 MG PO CAPS
250.0000 mg | ORAL_CAPSULE | Freq: Two times a day (BID) | ORAL | Status: DC
  Administered 2021-04-29 – 2021-05-01 (×4): 250 mg via ORAL
  Filled 2021-04-29 (×4): qty 1

## 2021-04-29 MED ORDER — LACTATED RINGERS IV SOLN: INTRAVENOUS | Status: DC | PRN

## 2021-04-29 MED ORDER — LIDOCAINE HCL (PF) 2 % IJ SOLN
INTRAMUSCULAR | Status: AC
  Filled 2021-04-29: qty 5

## 2021-04-29 MED ORDER — APREPITANT 40 MG PO CAPS
40.0000 mg | ORAL_CAPSULE | Freq: Once | ORAL | Status: AC
  Administered 2021-04-29: 40 mg via ORAL
  Filled 2021-04-29: qty 1

## 2021-04-29 MED ORDER — DEXAMETHASONE SODIUM PHOSPHATE 10 MG/ML IJ SOLN
INTRAMUSCULAR | Status: AC
  Filled 2021-04-29: qty 1

## 2021-04-29 MED ORDER — ENSURE PRE-SURGERY PO LIQD
296.0000 mL | Freq: Once | ORAL | Status: DC
  Filled 2021-04-29: qty 296

## 2021-04-29 MED ORDER — ROCURONIUM BROMIDE 10 MG/ML (PF) SYRINGE
PREFILLED_SYRINGE | INTRAVENOUS | Status: DC | PRN
  Administered 2021-04-29: 40 mg via INTRAVENOUS
  Administered 2021-04-29 (×2): 20 mg via INTRAVENOUS

## 2021-04-29 MED ORDER — ALVIMOPAN 12 MG PO CAPS
12.0000 mg | ORAL_CAPSULE | Freq: Two times a day (BID) | ORAL | Status: DC
  Administered 2021-04-30: 12 mg via ORAL
  Filled 2021-04-29: qty 1

## 2021-04-29 MED ORDER — ACETAMINOPHEN 500 MG PO TABS
1000.0000 mg | ORAL_TABLET | Freq: Four times a day (QID) | ORAL | Status: DC
  Administered 2021-04-29 – 2021-05-01 (×7): 1000 mg via ORAL
  Filled 2021-04-29 (×7): qty 2

## 2021-04-29 MED ORDER — TRAMADOL HCL 50 MG PO TABS
50.0000 mg | ORAL_TABLET | Freq: Four times a day (QID) | ORAL | Status: DC | PRN
  Administered 2021-04-29 – 2021-04-30 (×2): 50 mg via ORAL
  Filled 2021-04-29 (×2): qty 1

## 2021-04-29 MED ORDER — GABAPENTIN 300 MG PO CAPS
300.0000 mg | ORAL_CAPSULE | Freq: Two times a day (BID) | ORAL | Status: DC
  Administered 2021-04-29 – 2021-05-01 (×4): 300 mg via ORAL
  Filled 2021-04-29 (×4): qty 1

## 2021-04-29 MED ORDER — ENSURE SURGERY PO LIQD
237.0000 mL | Freq: Two times a day (BID) | ORAL | Status: DC
  Administered 2021-04-30: 237 mL via ORAL

## 2021-04-29 MED ORDER — ACETAMINOPHEN 500 MG PO TABS: 1000.0000 mg | ORAL_TABLET | ORAL | Status: DC

## 2021-04-29 MED ORDER — KETAMINE HCL 10 MG/ML IJ SOLN
INTRAMUSCULAR | Status: DC | PRN
  Administered 2021-04-29: 25 mg via INTRAVENOUS

## 2021-04-29 MED ORDER — PROPOFOL 10 MG/ML IV BOLUS
INTRAVENOUS | Status: DC | PRN
  Administered 2021-04-29: 140 mg via INTRAVENOUS

## 2021-04-29 MED ORDER — ORAL CARE MOUTH RINSE: 15.0000 mL | Freq: Once | OROMUCOSAL | Status: AC

## 2021-04-29 MED ORDER — PHENYLEPHRINE HCL (PRESSORS) 10 MG/ML IV SOLN
INTRAVENOUS | Status: AC
  Filled 2021-04-29: qty 2

## 2021-04-29 MED ORDER — HYDROMORPHONE HCL 1 MG/ML IJ SOLN: 0.5000 mg | INTRAMUSCULAR | Status: DC | PRN

## 2021-04-29 MED ORDER — CHLORHEXIDINE GLUCONATE 0.12 % MT SOLN
15.0000 mL | Freq: Once | OROMUCOSAL | Status: AC
  Administered 2021-04-29: 15 mL via OROMUCOSAL

## 2021-04-29 MED ORDER — PHENYLEPHRINE 40 MCG/ML (10ML) SYRINGE FOR IV PUSH (FOR BLOOD PRESSURE SUPPORT)
PREFILLED_SYRINGE | INTRAVENOUS | Status: DC | PRN
  Administered 2021-04-29: 40 ug via INTRAVENOUS

## 2021-04-29 MED ORDER — FENTANYL CITRATE (PF) 250 MCG/5ML IJ SOLN
INTRAMUSCULAR | Status: AC
  Filled 2021-04-29: qty 5

## 2021-04-29 MED ORDER — KETOROLAC TROMETHAMINE 30 MG/ML IJ SOLN
INTRAMUSCULAR | Status: AC
  Filled 2021-04-29: qty 1

## 2021-04-29 MED ORDER — ONDANSETRON HCL 4 MG PO TABS: 4.0000 mg | ORAL_TABLET | Freq: Four times a day (QID) | ORAL | Status: DC | PRN

## 2021-04-29 MED ORDER — ONDANSETRON HCL 4 MG/2ML IJ SOLN
INTRAMUSCULAR | Status: AC
  Filled 2021-04-29: qty 2

## 2021-04-29 MED ORDER — LACTATED RINGERS IV SOLN: INTRAVENOUS | Status: DC

## 2021-04-29 MED ORDER — SODIUM CHLORIDE 0.9 % IV SOLN
2.0000 g | INTRAVENOUS | Status: AC
  Administered 2021-04-29: 2 g via INTRAVENOUS
  Filled 2021-04-29: qty 2

## 2021-04-29 MED ORDER — ENOXAPARIN SODIUM 40 MG/0.4ML IJ SOSY
40.0000 mg | PREFILLED_SYRINGE | INTRAMUSCULAR | Status: DC
  Administered 2021-04-30 – 2021-05-01 (×2): 40 mg via SUBCUTANEOUS
  Filled 2021-04-29 (×2): qty 0.4

## 2021-04-29 MED ORDER — HYDROMORPHONE HCL 1 MG/ML IJ SOLN
INTRAMUSCULAR | Status: AC
  Administered 2021-04-29: 0.5 mg via INTRAVENOUS
  Filled 2021-04-29: qty 1

## 2021-04-29 MED ORDER — ALUM & MAG HYDROXIDE-SIMETH 200-200-20 MG/5ML PO SUSP: 30.0000 mL | Freq: Four times a day (QID) | ORAL | Status: DC | PRN

## 2021-04-29 MED ORDER — PHENYLEPHRINE HCL-NACL 20-0.9 MG/250ML-% IV SOLN
INTRAVENOUS | Status: DC | PRN
  Administered 2021-04-29: 20 ug/min via INTRAVENOUS

## 2021-04-29 MED ORDER — DEXAMETHASONE SODIUM PHOSPHATE 10 MG/ML IJ SOLN
INTRAMUSCULAR | Status: DC | PRN
Start: 2021-04-29 — End: 2021-04-29
  Administered 2021-04-29: 6 mg via INTRAVENOUS

## 2021-04-29 MED ORDER — ACETAMINOPHEN 500 MG PO TABS
1000.0000 mg | ORAL_TABLET | Freq: Once | ORAL | Status: AC
  Administered 2021-04-29: 1000 mg via ORAL
  Filled 2021-04-29: qty 2

## 2021-04-29 MED ORDER — PHENYLEPHRINE 40 MCG/ML (10ML) SYRINGE FOR IV PUSH (FOR BLOOD PRESSURE SUPPORT)
PREFILLED_SYRINGE | INTRAVENOUS | Status: AC
  Filled 2021-04-29: qty 10

## 2021-04-29 MED ORDER — BUPIVACAINE LIPOSOME 1.3 % IJ SUSP
INTRAMUSCULAR | Status: DC | PRN
  Administered 2021-04-29: 20 mL

## 2021-04-29 MED ORDER — MIDAZOLAM HCL 2 MG/2ML IJ SOLN
INTRAMUSCULAR | Status: DC | PRN
  Administered 2021-04-29: 2 mg via INTRAVENOUS

## 2021-04-29 MED ORDER — BUPIVACAINE-EPINEPHRINE 0.25% -1:200000 IJ SOLN
INTRAMUSCULAR | Status: DC | PRN
  Administered 2021-04-29: 30 mL

## 2021-04-29 MED ORDER — MIDAZOLAM HCL 2 MG/2ML IJ SOLN
INTRAMUSCULAR | Status: AC
  Filled 2021-04-29: qty 2

## 2021-04-29 MED ORDER — SUGAMMADEX SODIUM 200 MG/2ML IV SOLN
INTRAVENOUS | Status: DC | PRN
  Administered 2021-04-29: 200 mg via INTRAVENOUS

## 2021-04-29 MED ORDER — BUPIVACAINE LIPOSOME 1.3 % IJ SUSP
INTRAMUSCULAR | Status: AC
  Filled 2021-04-29: qty 20

## 2021-04-29 MED ORDER — LACTATED RINGERS IR SOLN
Status: DC | PRN
  Administered 2021-04-29: 1000 mL

## 2021-04-29 MED ORDER — ONDANSETRON HCL 4 MG/2ML IJ SOLN
4.0000 mg | Freq: Four times a day (QID) | INTRAMUSCULAR | Status: DC | PRN
  Administered 2021-04-29: 4 mg via INTRAVENOUS
  Filled 2021-04-29: qty 2

## 2021-04-29 MED ORDER — MEPERIDINE HCL 50 MG/ML IJ SOLN: 6.2500 mg | INTRAMUSCULAR | Status: DC | PRN

## 2021-04-29 MED ORDER — HYDROMORPHONE HCL 1 MG/ML IJ SOLN
0.2500 mg | INTRAMUSCULAR | Status: DC | PRN
  Administered 2021-04-29: 0.5 mg via INTRAVENOUS

## 2021-04-29 MED ORDER — ROCURONIUM BROMIDE 10 MG/ML (PF) SYRINGE
PREFILLED_SYRINGE | INTRAVENOUS | Status: AC
  Filled 2021-04-29: qty 10

## 2021-04-29 MED ORDER — KCL IN DEXTROSE-NACL 20-5-0.45 MEQ/L-%-% IV SOLN
INTRAVENOUS | Status: DC
  Filled 2021-04-29 (×2): qty 1000

## 2021-04-29 MED ORDER — 0.9 % SODIUM CHLORIDE (POUR BTL) OPTIME
TOPICAL | Status: DC | PRN
  Administered 2021-04-29: 2000 mL

## 2021-04-29 MED ORDER — GABAPENTIN 300 MG PO CAPS
300.0000 mg | ORAL_CAPSULE | ORAL | Status: AC
  Administered 2021-04-29: 300 mg via ORAL
  Filled 2021-04-29: qty 1

## 2021-04-29 SURGICAL SUPPLY — 87 items
BAG COUNTER SPONGE SURGICOUNT (BAG) IMPLANT
BLADE EXTENDED COATED 6.5IN (ELECTRODE) IMPLANT
CANNULA REDUC XI 12-8 STAPL (CANNULA)
CANNULA REDUCER 12-8 DVNC XI (CANNULA) IMPLANT
CELLS DAT CNTRL 66122 CELL SVR (MISCELLANEOUS) IMPLANT
COVER SURGICAL LIGHT HANDLE (MISCELLANEOUS) ×4 IMPLANT
COVER TIP SHEARS 8 DVNC (MISCELLANEOUS) ×1 IMPLANT
COVER TIP SHEARS 8MM DA VINCI (MISCELLANEOUS) ×2
DECANTER SPIKE VIAL GLASS SM (MISCELLANEOUS) IMPLANT
DERMABOND ADVANCED (GAUZE/BANDAGES/DRESSINGS) ×1
DERMABOND ADVANCED .7 DNX12 (GAUZE/BANDAGES/DRESSINGS) ×1 IMPLANT
DRAIN CHANNEL 19F RND (DRAIN) IMPLANT
DRAPE ARM DVNC X/XI (DISPOSABLE) ×4 IMPLANT
DRAPE COLUMN DVNC XI (DISPOSABLE) ×1 IMPLANT
DRAPE DA VINCI XI ARM (DISPOSABLE) ×8
DRAPE DA VINCI XI COLUMN (DISPOSABLE) ×2
DRAPE SURG IRRIG POUCH 19X23 (DRAPES) ×2 IMPLANT
DRSG OPSITE POSTOP 4X10 (GAUZE/BANDAGES/DRESSINGS) IMPLANT
DRSG OPSITE POSTOP 4X6 (GAUZE/BANDAGES/DRESSINGS) ×2 IMPLANT
DRSG OPSITE POSTOP 4X8 (GAUZE/BANDAGES/DRESSINGS) IMPLANT
ELECT PENCIL ROCKER SW 15FT (MISCELLANEOUS) ×4 IMPLANT
ELECT REM PT RETURN 15FT ADLT (MISCELLANEOUS) ×2 IMPLANT
ENDOLOOP SUT PDS II  0 18 (SUTURE)
ENDOLOOP SUT PDS II 0 18 (SUTURE) IMPLANT
EVACUATOR SILICONE 100CC (DRAIN) IMPLANT
GLOVE SURG ENC MOIS LTX SZ6.5 (GLOVE) ×6 IMPLANT
GLOVE SURG UNDER POLY LF SZ7 (GLOVE) ×4 IMPLANT
GOWN STRL REUS W/TWL XL LVL3 (GOWN DISPOSABLE) ×6 IMPLANT
GRASPER SUT TROCAR 14GX15 (MISCELLANEOUS) IMPLANT
HOLDER FOLEY CATH W/STRAP (MISCELLANEOUS) ×2 IMPLANT
IRRIG SUCT STRYKERFLOW 2 WTIP (MISCELLANEOUS) ×2
IRRIGATION SUCT STRKRFLW 2 WTP (MISCELLANEOUS) ×1 IMPLANT
KIT PROCEDURE DA VINCI SI (MISCELLANEOUS)
KIT PROCEDURE DVNC SI (MISCELLANEOUS) IMPLANT
KIT TURNOVER KIT A (KITS) ×2 IMPLANT
NEEDLE INSUFFLATION 14GA 120MM (NEEDLE) ×2 IMPLANT
PACK CARDIOVASCULAR III (CUSTOM PROCEDURE TRAY) ×2 IMPLANT
PACK COLON (CUSTOM PROCEDURE TRAY) ×2 IMPLANT
PAD POSITIONING PINK XL (MISCELLANEOUS) ×2 IMPLANT
RELOAD STAPLER 3.5X60 BLU DVNC (STAPLE) ×1 IMPLANT
RELOAD STAPLER 4.3X60 GRN DVNC (STAPLE) IMPLANT
RTRCTR WOUND ALEXIS 18CM MED (MISCELLANEOUS)
SCISSORS LAP 5X35 DISP (ENDOMECHANICALS) IMPLANT
SEAL CANN UNIV 5-8 DVNC XI (MISCELLANEOUS) ×4 IMPLANT
SEAL XI 5MM-8MM UNIVERSAL (MISCELLANEOUS) ×8
SEALER VESSEL DA VINCI XI (MISCELLANEOUS) ×2
SEALER VESSEL EXT DVNC XI (MISCELLANEOUS) ×1 IMPLANT
SOLUTION ELECTROLUBE (MISCELLANEOUS) ×2 IMPLANT
SPONGE T-LAP 18X18 ~~LOC~~+RFID (SPONGE) ×4 IMPLANT
STAPLER 60 DA VINCI SURE FORM (STAPLE) ×2
STAPLER 60 SUREFORM DVNC (STAPLE) ×1 IMPLANT
STAPLER CANNULA SEAL DVNC XI (STAPLE) ×1 IMPLANT
STAPLER CANNULA SEAL XI (STAPLE) ×2
STAPLER ECHELON POWER CIR 29 (STAPLE) ×2 IMPLANT
STAPLER ECHELON POWER CIR 31 (STAPLE) IMPLANT
STAPLER RELOAD 3.5X60 BLU DVNC (STAPLE) ×1
STAPLER RELOAD 3.5X60 BLUE (STAPLE) ×2
STAPLER RELOAD 4.3X60 GREEN (STAPLE)
STAPLER RELOAD 4.3X60 GRN DVNC (STAPLE)
SUT ETHILON 2 0 PS N (SUTURE) IMPLANT
SUT NOVA NAB DX-16 0-1 5-0 T12 (SUTURE) ×4 IMPLANT
SUT PROLENE 2 0 KS (SUTURE) ×2 IMPLANT
SUT SILK 2 0 (SUTURE) ×2
SUT SILK 2 0 SH CR/8 (SUTURE) IMPLANT
SUT SILK 2-0 18XBRD TIE 12 (SUTURE) ×1 IMPLANT
SUT SILK 3 0 (SUTURE)
SUT SILK 3 0 SH CR/8 (SUTURE) ×2 IMPLANT
SUT SILK 3-0 18XBRD TIE 12 (SUTURE) IMPLANT
SUT V-LOC BARB 180 2/0GR6 GS22 (SUTURE)
SUT VIC AB 2-0 SH 18 (SUTURE) IMPLANT
SUT VIC AB 2-0 SH 27 (SUTURE)
SUT VIC AB 2-0 SH 27X BRD (SUTURE) IMPLANT
SUT VIC AB 3-0 SH 18 (SUTURE) IMPLANT
SUT VIC AB 4-0 PS2 27 (SUTURE) ×4 IMPLANT
SUT VICRYL 0 UR6 27IN ABS (SUTURE) ×2 IMPLANT
SUTURE V-LC BRB 180 2/0GR6GS22 (SUTURE) IMPLANT
SYR 10ML ECCENTRIC (SYRINGE) ×2 IMPLANT
SYS LAPSCP GELPORT 120MM (MISCELLANEOUS)
SYS WOUND ALEXIS 18CM MED (MISCELLANEOUS)
SYSTEM LAPSCP GELPORT 120MM (MISCELLANEOUS) IMPLANT
SYSTEM WOUND ALEXIS 18CM MED (MISCELLANEOUS) IMPLANT
TOWEL OR 17X26 10 PK STRL BLUE (TOWEL DISPOSABLE) IMPLANT
TOWEL OR NON WOVEN STRL DISP B (DISPOSABLE) ×2 IMPLANT
TRAY FOLEY MTR SLVR 14FR STAT (SET/KITS/TRAYS/PACK) ×2 IMPLANT
TROCAR ADV FIXATION 5X100MM (TROCAR) ×2 IMPLANT
TUBING CONNECTING 10 (TUBING) ×4 IMPLANT
TUBING INSUFFLATION 10FT LAP (TUBING) ×2 IMPLANT

## 2021-04-29 NOTE — Anesthesia Procedure Notes (Signed)
Procedure Name: Intubation Date/Time: 04/29/2021 1:20 PM Performed by: Genelle Bal, CRNA Pre-anesthesia Checklist: Patient identified, Emergency Drugs available, Suction available and Patient being monitored Patient Re-evaluated:Patient Re-evaluated prior to induction Oxygen Delivery Method: Circle system utilized Preoxygenation: Pre-oxygenation with 100% oxygen Induction Type: IV induction Ventilation: Mask ventilation without difficulty Laryngoscope Size: Miller and 2 Grade View: Grade I Tube type: Oral Tube size: 7.0 mm Number of attempts: 1 Airway Equipment and Method: Stylet and Oral airway Placement Confirmation: ETT inserted through vocal cords under direct vision, positive ETCO2 and breath sounds checked- equal and bilateral Secured at: 21 cm Tube secured with: Tape Dental Injury: Teeth and Oropharynx as per pre-operative assessment

## 2021-04-29 NOTE — Anesthesia Preprocedure Evaluation (Addendum)
Anesthesia Evaluation  Patient identified by MRN, date of birth, ID band Patient awake    Reviewed: Allergy & Precautions, NPO status , Patient's Chart, lab work & pertinent test results  History of Anesthesia Complications (+) PONV and history of anesthetic complications  Airway Mallampati: I  TM Distance: >3 FB Neck ROM: Full    Dental no notable dental hx. (+) Dental Advisory Given, Teeth Intact   Pulmonary neg pulmonary ROS,    Pulmonary exam normal breath sounds clear to auscultation       Cardiovascular negative cardio ROS Normal cardiovascular exam Rhythm:Regular Rate:Normal     Neuro/Psych negative neurological ROS     GI/Hepatic negative GI ROS, Neg liver ROS,   Endo/Other  negative endocrine ROS  Renal/GU negative Renal ROS     Musculoskeletal negative musculoskeletal ROS (+)   Abdominal   Peds  Hematology  (+) Blood dyscrasia, anemia ,   Anesthesia Other Findings   Reproductive/Obstetrics                           Anesthesia Physical Anesthesia Plan  ASA: 2  Anesthesia Plan: General   Post-op Pain Management:    Induction: Intravenous  PONV Risk Score and Plan: Treatment may vary due to age or medical condition, Midazolam, Ondansetron, Dexamethasone, Aprepitant and Propofol infusion  Airway Management Planned: Oral ETT  Additional Equipment: None  Intra-op Plan:   Post-operative Plan: Extubation in OR  Informed Consent: I have reviewed the patients History and Physical, chart, labs and discussed the procedure including the risks, benefits and alternatives for the proposed anesthesia with the patient or authorized representative who has indicated his/her understanding and acceptance.     Dental advisory given  Plan Discussed with: CRNA  Anesthesia Plan Comments:        Anesthesia Quick Evaluation

## 2021-04-29 NOTE — Transfer of Care (Signed)
Immediate Anesthesia Transfer of Care Note  Patient: Joan Owen  Procedure(s) Performed: XI ROBOT ASSISTED LAPAROSCOPIC SIGMOID COLECTOMY (Abdomen)  Patient Location: PACU  Anesthesia Type:General  Level of Consciousness: awake, alert  and oriented  Airway & Oxygen Therapy: Patient Spontanous Breathing and Patient connected to face mask oxygen  Post-op Assessment: Report given to RN and Post -op Vital signs reviewed and stable  Post vital signs: Reviewed and stable  Last Vitals:  Vitals Value Taken Time  BP 123/67 04/29/21 1551  Temp    Pulse 76 04/29/21 1552  Resp 20 04/29/21 1552  SpO2 100 % 04/29/21 1552  Vitals shown include unvalidated device data.  Last Pain:  Vitals:   04/29/21 1046  TempSrc:   PainSc: 0-No pain         Complications: No notable events documented.

## 2021-04-29 NOTE — Interval H&P Note (Signed)
History and Physical Interval Note:  04/29/2021 12:17 PM  Joan Owen  has presented today for surgery, with the diagnosis of colon cancer.  The various methods of treatment have been discussed with the patient and family. After consideration of risks, benefits and other options for treatment, the patient has consented to  Procedure(s): XI ROBOT ASSISTED LAPAROSCOPIC PARTIAL COLECTOMY (N/A) as a surgical intervention.  The patient's history has been reviewed, patient examined, no change in status, stable for surgery.  I have reviewed the patient's chart and labs.  Questions were answered to the patient's satisfaction.     Rosario Adie, MD  [facility]

## 2021-04-29 NOTE — Anesthesia Postprocedure Evaluation (Signed)
Anesthesia Post Note  Patient: Joan Owen  Procedure(s) Performed: XI ROBOT ASSISTED LAPAROSCOPIC SIGMOID COLECTOMY (Abdomen)     Patient location during evaluation: PACU Anesthesia Type: General Level of consciousness: sedated and patient cooperative Pain management: pain level controlled Vital Signs Assessment: post-procedure vital signs reviewed and stable Respiratory status: spontaneous breathing Cardiovascular status: stable Anesthetic complications: no   No notable events documented.  Last Vitals:  Vitals:   04/29/21 1712 04/29/21 1755  BP: 122/77 133/71  Pulse: 75 72  Resp: 18 14  Temp:    SpO2: 99% 100%    Last Pain:  Vitals:   04/29/21 1645  TempSrc:   PainSc: [place]

## 2021-04-29 NOTE — Op Note (Signed)
04/29/2021  3:32 PM  PATIENT:  Joan Owen  65 y.o. female  Patient Care Team: [name], MD as PCP - General (Family Medicine) Mezer, Nadara Mustard, MD as Consulting Physician (Gynecology) Lafayette Dragon, MD (Inactive) as Consulting Physician (Gastroenterology) Levy Sjogren, MD as Referring Physician (Dermatology) Lin Givens, RN as Oncology Nurse Navigator  PRE-OPERATIVE DIAGNOSIS:  colon cancer  POST-OPERATIVE DIAGNOSIS:  colon cancer of the sigmoid  PROCEDURE:  XI ROBOT ASSISTED SIGMOIDECTOMY  Surgeon(s): Leighton Ruff, MD Ileana Roup, MD  ASSISTANT: Dr Dema Severin   ANESTHESIA:   local and general  EBL: 20 ml Total I/O In: 1600 [I.V.:1500; IV Piggyback:100] Out: 220 [Urine:200; Blood:20]  Delay start of Pharmacological VTE agent (>24hrs) due to surgical blood loss or risk of bleeding:  no  DRAINS: none   SPECIMEN:  Source of Specimen:  Sigmoid colon, final distal margin  DISPOSITION OF SPECIMEN:  PATHOLOGY  COUNTS:  YES  PLAN OF CARE: Admit to inpatient   PATIENT DISPOSITION:  PACU - hemodynamically stable.  INDICATION:    65 y.o. 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  I recommended segmental resection:  The anatomy & physiology of the digestive tract was discussed.  The pathophysiology was discussed.  Natural history risks without surgery was discussed.   I worked to give an overview of the disease and the frequent need to have multispecialty involvement.  I feel the risks of no intervention will lead to serious problems that outweigh the operative risks; therefore, I recommended a partial colectomy to remove the pathology.  Laparoscopic & open techniques were discussed.   Risks such as bleeding, infection, abscess, leak, reoperation, possible ostomy, hernia, heart attack, death, and other risks were discussed.  I noted a good likelihood this will help address the problem.   Goals of post-operative recovery were  discussed as well.    The patient expressed understanding & wished to proceed with surgery.  OR FINDINGS:   Patient had large mass in her distal sigmoid colon with tattoo noted proximal and distal  No obvious metastatic disease on visceral parietal peritoneum or liver.  The anastomosis rests 12 cm from the anal verge by rigid proctoscopy.  DESCRIPTION:   Informed consent was confirmed.  The patient underwent general anaesthesia without difficulty.  The patient was positioned appropriately.  VTE prevention in place.  The patient's abdomen was clipped, prepped, & draped in a sterile fashion.  Surgical timeout confirmed our plan.  The patient was positioned in reverse Trendelenburg.  Abdominal entry was gained using a Varies needle in the LUQ.  Entry was clean.  I induced carbon dioxide insufflation.  An 25m robotic port was placed in the LUQ.  Camera inspection revealed a 127marea of bleeding in the liver.  This was stopped with direct pressure.  Extra ports were carefully placed under direct laparoscopic visualization.  I laparoscopically reflected the greater omentum and the upper abdomen the small bowel in the upper abdomen. The patient was appropriately positioned and the robot was docked to the patient's left side.  Instruments were placed under direct visualization.     I began by scoring the base of peritoneum of the right side of the mesentery of the left colon from the ligament of Treitz to the peritoneal reflection of the mid rectum.  The patient had tattoo noted in her distal sigmoid colon.  I elevated the sigmoid mesentery and enetered into the retro-mesenteric plane. We were able to identify the left ureter  and gonadal vessels. We kept those posterior within the retroperitoneum and elevated the left colon mesentery off that. I did isolated IMA pedicle but did not ligate it yet.  I continued distally and got into the avascular plane posterior to the mesorectum. This allowed me to help  mobilize the rectum as well by freeing the mesorectum off the sacrum.  I mobilized the peritoneal coverings towards the peritoneal reflection on both the right and left sides of the rectum.  I could see the right and left ureters and stayed away from them.    I skeletonized the inferior mesenteric artery pedicle.  I went down to its takeoff from the aorta.  After confirming the left ureter was out of the way, I went ahead and ligated the inferior mesenteric artery pedicle with bipolar robotic vessel sealer ~2cm above its takeoff from the aorta.  We ensured hemostasis. I skeletonized the mesorectum at the junction of the proximal rectum using blunt dissection & bipolar robotic vessel sealer.  I mobilized the left colon in a lateral to medial fashion off the line of Toldt up towards the splenic flexure to ensure good mobilization of the left colon to reach into the pelvis.  I freed the retroperitoneal attachments.  The remainder of the colon mobilized into the pelvis well.  I divided the mesentery up to the level of the descending/sigmoid colon junction.  This was done using a robotic vessel sealer.  We then inspected for hemostasis or injury.  There was no active bleeding noted.  There was no injury noted to any critical structures.  We then inserted a blue load 60 mm robotic stapler and divided the rectosigmoid junction.  At this point the robot was undocked and the 12 mm suprapubic port was enlarged to a Pfannenstiel incision.  An [place] wound protector was placed.  The colon was brought out of the wound and transected proximally over a pursestring device.  A 2-0 Prolene pursestring was placed.  A 29 mm EEA anvil was placed into the end of the proximal colon and the Prolene suture was tied tightly around this.  This was then placed back into the abdomen.  The abdomen was reinsufflated and a colorectal anastomosis was created using the EEA stapler under laparoscopic visualization.  There was no tension noted on  the anastomosis.  There was no leak when tested with insufflation under irrigation.  The anastomosis rest approximately 12 cm from the anal verge.  Once this was completed I inspected the abdomen again.  There was no sign of active bleeding.  The robotic ports were removed and we switched to clean gowns, gloves, instruments and drapes.  The Pfannenstiel peritoneum was closed using a running 0 Vicryl suture.  The fascia was closed using 2 running #1 Novafil sutures.  I reapproximated the subcutaneous tissues using a running 2-0 Vicryl suture.  The skin was closed using a 4-0 Vicryl subcuticular suture.  A sterile dressing was applied.  The remainder of the port sites were closed using interrupted 4-0 Vicryl sutures and Dermabond.  The patient was then awakened from anesthesia and sent to the postanesthesia care unit in stable condition.  All counts were correct per operating room staff.  An MD assistant was necessary for tissue manipulation, retraction and positioning due to the complexity of the case and hospital policies

## 2021-04-30 LAB — CBC
HCT: 33.2 % — ABNORMAL LOW (ref 36.0–46.0)
Hemoglobin: 10.9 g/dL — ABNORMAL LOW (ref 12.0–15.0)
MCH: 29.9 pg (ref 26.0–34.0)
MCHC: 32.8 g/dL (ref 30.0–36.0)
MCV: 91.2 fL (ref 80.0–100.0)
Platelets: 250 10*3/uL (ref 150–400)
RBC: 3.64 MIL/uL — ABNORMAL LOW (ref 3.87–5.11)
RDW: 12.8 % (ref 11.5–15.5)
WBC: 11.6 10*3/uL — ABNORMAL HIGH (ref 4.0–10.5)
nRBC: 0 % (ref 0.0–0.2)

## 2021-04-30 LAB — BASIC METABOLIC PANEL
Anion gap: 7 (ref 5–15)
BUN: 5 mg/dL — ABNORMAL LOW (ref 8–23)
CO2: 27 mmol/L (ref 22–32)
Calcium: 9.2 mg/dL (ref 8.9–10.3)
Chloride: 108 mmol/L (ref 98–111)
Creatinine, Ser: 0.61 mg/dL (ref 0.44–1.00)
GFR, Estimated: >60 mL/min (ref >60)
Glucose, Bld: 144 mg/dL — ABNORMAL HIGH (ref 70–99)
Potassium: 4.4 mmol/L (ref 3.5–5.1)
Sodium: 142 mmol/L (ref 135–145)

## 2021-04-30 NOTE — Progress Notes (Signed)
1 Day Post-Op Robotic Sigmoidectomy Subjective: No acute issues, no nausea, having some fecal leakage but no BM's yet, foley out  Objective: Vital signs in last 24 hours: Temp:  [97.2 F (36.2 C)-98.2 F (36.8 C)] 97.6 F (36.4 C) (09/17 0616) Pulse Rate:  [52-85] 52 (09/17 0616) Resp:  [11-18] 18 (09/17 0616) BP: (107-134)/(56-87) 134/66 (09/17 0616) SpO2:  [93 %-100 %] 98 % (09/17 0616) Weight:  [63 kg] 63 kg (09/16 1046)   Intake/Output from previous day: 09/16 0701 - 09/17 0700 In: 2853.3 [P.O.:240; I.V.:2413.3; IV Piggyback:200] Out: 2521 [Urine:2500; Emesis/NG output:1; Blood:20] Intake/Output this shift: No intake/output data recorded.   General appearance: alert and cooperative GI: soft, nondistended  Incision: no significant drainage  Lab Results:  Recent Labs    04/30/21 0433  WBC 11.6*  HGB 10.9*  HCT 33.2*  PLT 250   BMET Recent Labs    04/30/21 0433  NA 142  K 4.4  CL 108  CO2 27  GLUCOSE 144*  BUN 5*  CREATININE 0.61  CALCIUM 9.2   PT/INR No results for input(s): LABPROT, INR in the last 72 hours. ABG No results for input(s): PHART, HCO3 in the last 72 hours.  Invalid input(s): PCO2, PO2  MEDS, Scheduled  acetaminophen  1,000 mg Oral Q6H   alvimopan  12 mg Oral BID   enoxaparin (LOVENOX) injection  40 mg Subcutaneous Q24H   feeding supplement  237 mL Oral BID BM   gabapentin  300 mg Oral BID   saccharomyces boulardii  250 mg Oral BID    Studies/Results: No results found.  Assessment: s/p Procedure(s): XI ROBOT ASSISTED LAPAROSCOPIC SIGMOID COLECTOMY Patient Active Problem List   Diagnosis Date Noted   Colon cancer ([facility]) 04/29/2021   Vitamin D deficiency 11/02/2020   Anemia 11/02/2020   Osteoporosis 10/31/2019   Hyperlipidemia 10/31/2019   Palpitations 01/05/2015   Right shoulder pain 01/05/2015   Preventative health care 01/05/2015   BCC (basal cell carcinoma), face 01/05/2015   History of chicken pox     Expected  post op course  Plan: Cont Fulls Ambulate in hall SL IVF's   LOS: 1 day     .Rosario Adie, MD [facility], [place]    04/30/2021 9:03 AM

## 2021-05-01 LAB — CBC
HCT: 32.2 % — ABNORMAL LOW (ref 36.0–46.0)
Hemoglobin: 10.4 g/dL — ABNORMAL LOW (ref 12.0–15.0)
MCH: 29.7 pg (ref 26.0–34.0)
MCHC: 32.3 g/dL (ref 30.0–36.0)
MCV: 92 fL (ref 80.0–100.0)
Platelets: 243 10*3/uL (ref 150–400)
RBC: 3.5 MIL/uL — ABNORMAL LOW (ref 3.87–5.11)
RDW: 13.2 % (ref 11.5–15.5)
WBC: 6.3 10*3/uL (ref 4.0–10.5)
nRBC: 0 % (ref 0.0–0.2)

## 2021-05-01 LAB — BASIC METABOLIC PANEL
Anion gap: 8 (ref 5–15)
BUN: 8 mg/dL (ref 8–23)
CO2: 28 mmol/L (ref 22–32)
Calcium: 8.6 mg/dL — ABNORMAL LOW (ref 8.9–10.3)
Chloride: 104 mmol/L (ref 98–111)
Creatinine, Ser: 0.56 mg/dL (ref 0.44–1.00)
GFR, Estimated: >60 mL/min (ref >60)
Glucose, Bld: 86 mg/dL (ref 70–99)
Potassium: 4.1 mmol/L (ref 3.5–5.1)
Sodium: 140 mmol/L (ref 135–145)

## 2021-05-01 MED ORDER — TRAMADOL HCL 50 MG PO TABS: 50.0000 mg | ORAL_TABLET | Freq: Four times a day (QID) | ORAL | 0 refills | Status: DC | PRN

## 2021-05-01 NOTE — Progress Notes (Signed)
Assessment unchanged. Pt verbalized understanding of dc instructions. Discharged via wc to front entrance accompanied by NT.

## 2021-05-01 NOTE — Plan of Care (Signed)

## 2021-05-01 NOTE — Discharge Summary (Signed)
Physician Discharge Summary  Patient ID: Joan Owen MRN: [id number] DOB/AGE: 64-Nov-1957 64 y.o.  Admit date: 04/29/2021 Discharge date: 05/01/2021  Admission Diagnoses: Colon cancer  Discharge Diagnoses:  Active Problems:   Colon cancer [facility])   Discharged Condition: good  Hospital Course: Patient was admitted to the med surg floor after surgery.  Diet was advanced as tolerated.  Patient began to have bowel function on postop day 1.  By postop day 2, she was tolerating a solid diet and pain was controlled with oral medications.  She was urinating without difficulty and ambulating without assistance.  Patient was felt to be in stable condition for discharge to home.   Consults: None  Significant Diagnostic Studies: labs: cbc, bmet  Treatments: IV hydration, analgesia: acetaminophen and Tramadol, and surgery: Robotic Sigmoidectomy  Discharge Exam: Blood pressure 131/72, pulse 66, temperature 98.1 F (36.7 C), temperature source Oral, resp. rate 18, weight 63 kg, last menstrual period 03/28/2013, SpO2 96 %. General appearance: alert and cooperative GI: soft, nondistended, appropriately tender Incision/Wound: clean, dry, intact  Disposition: home   Allergies as of 05/01/2021   No Known Allergies      Medication List     STOP taking these medications    metroNIDAZOLE 500 MG tablet Commonly known as: FLAGYL   neomycin 500 MG tablet Commonly known as: MYCIFRADIN       TAKE these medications    CALCIUM-VITAMIN D PO Take 1 tablet by mouth daily.   multivitamin with minerals tablet Take 1 tablet by mouth daily.   traMADol 50 MG tablet Commonly known as: ULTRAM Take 1-2 tablets (50-100 mg total) by mouth every 6 (six) hours as needed for moderate pain.        Follow-up Information     Leighton Ruff, MD. Schedule an appointment as soon as possible for a visit in 2 week(s).   Specialties: General Surgery, Colon and Rectal Surgery Contact information: [place] [address] [phone]                 Signed: Rosario Adie [id number], 8:45 AM

## 2021-05-01 NOTE — Discharge Instructions (Signed)
SURGERY: POST OP INSTRUCTIONS (Surgery for small bowel obstruction, colon resection, etc)   ######################################################################  EAT Gradually transition to a high fiber diet with a fiber supplement over the next few days after discharge  WALK Walk an hour a day.  Control your pain to do that.    CONTROL PAIN Control pain so that you can walk, sleep, tolerate sneezing/coughing, go up/down stairs.  HAVE A BOWEL MOVEMENT DAILY Keep your bowels regular to avoid problems.  OK to try a laxative to override constipation.  OK to use an antidairrheal to slow down diarrhea.  Call if not better after 2 tries  CALL IF YOU HAVE PROBLEMS/CONCERNS Call if you are still struggling despite following these instructions. Call if you have concerns not answered by these instructions  ######################################################################   DIET Follow a light diet the first few days at home.  Start with a bland diet such as soups, liquids, starchy foods, low fat foods, etc.  If you feel full, bloated, or constipated, stay on a ful liquid or pureed/blenderized diet for a few days until you feel better and no longer constipated. Be sure to drink plenty of fluids every day to avoid getting dehydrated (feeling dizzy, not urinating, etc.). Gradually add a fiber supplement to your diet over the next week.  Gradually get back to a regular solid diet.  Avoid fast food or heavy meals the first week as you are more likely to get nauseated. It is expected for your digestive tract to need a few months to get back to normal.  It is common for your bowel movements and stools to be irregular.  You will have occasional bloating and cramping that should eventually fade away.  Until you are eating solid food normally, off all pain medications, and back to regular activities; your bowels will not be normal. Focus on eating a low-fat, high fiber diet the rest of your life  (See Getting to Good Bowel Health, below).  CARE of your INCISION or WOUND  It is good for closed incisions and even open wounds to be washed every day.  Shower every day.  Short baths are fine.  Wash the incisions and wounds clean with soap & water.    You may leave closed incisions open to air if it is dry.   You may cover the incision with clean gauze & replace it after your daily shower for comfort.  STAPLES: You have skin staples.  Leave them in place & set up an appointment for them to be removed by a surgery office nurse ~10 days after surgery. = 1st week of January 2024    ACTIVITIES as tolerated Start light daily activities --- self-care, walking, climbing stairs-- beginning the day after surgery.  Gradually increase activities as tolerated.  Control your pain to be active.  Stop when you are tired.  Ideally, walk several times a day, eventually an hour a day.   Most people are back to most day-to-day activities in a few weeks.  It takes 4-8 weeks to get back to unrestricted, intense activity. If you can walk 30 minutes without difficulty, it is safe to try more intense activity such as jogging, treadmill, bicycling, low-impact aerobics, swimming, etc. Save the most intensive and strenuous activity for last (Usually 4-8 weeks after surgery) such as sit-ups, heavy lifting, contact sports, etc.  Refrain from any intense heavy lifting or straining until you are off narcotics for pain control.  You will have off days, but things should improve   week-by-week. DO NOT PUSH THROUGH PAIN.  Let pain be your guide: If it hurts to do something, don't do it.  Pain is your body warning you to avoid that activity for another week until the pain goes down. You may drive when you are no longer taking narcotic prescription pain medication, you can comfortably wear a seatbelt, and you can safely make sudden turns/stops to protect yourself without hesitating due to pain. You may have sexual intercourse when it  is comfortable. If it hurts to do something, stop.  MEDICATIONS Take your usually prescribed home medications unless otherwise directed.   Blood thinners:  Usually you can restart any strong blood thinners after the second postoperative day.  It is OK to take aspirin right away.     If you are on strong blood thinners (warfarin/Coumadin, Plavix, Xerelto, Eliquis, Pradaxa, etc), discuss with your surgeon, medicine PCP, and/or cardiologist for instructions on when to restart the blood thinner & if blood monitoring is needed (PT/INR blood check, etc).     PAIN CONTROL Pain after surgery or related to activity is often due to strain/injury to muscle, tendon, nerves and/or incisions.  This pain is usually short-term and will improve in a few months.  To help speed the process of healing and to get back to regular activity more quickly, DO THE FOLLOWING THINGS TOGETHER: Increase activity gradually.  DO NOT PUSH THROUGH PAIN Use Ice and/or Heat Try Gentle Massage and/or Stretching Take over the counter pain medication Take Narcotic prescription pain medication for more severe pain  Good pain control = faster recovery.  It is better to take more medicine to be more active than to stay in bed all day to avoid medications.  Increase activity gradually Avoid heavy lifting at first, then increase to lifting as tolerated over the next 6 weeks. Do not "push through" the pain.  Listen to your body and avoid positions and maneuvers than reproduce the pain.  Wait a few days before trying something more intense Walking an hour a day is encouraged to help your body recover faster and more safely.  Start slowly and stop when getting sore.  If you can walk 30 minutes without stopping or pain, you can try more intense activity (running, jogging, aerobics, cycling, swimming, treadmill, sex, sports, weightlifting, etc.) Remember: If it hurts to do it, then don't do it! Use Ice and/or Heat You will have swelling and  bruising around the incisions.  This will take several weeks to resolve. Ice packs or heating pads (6-8 times a day, 30-60 minutes at a time) will help sooth soreness & bruising. Some people prefer to use ice alone, heat alone, or alternate between ice & heat.  Experiment and see what works best for you.  Consider trying ice for the first few days to help decrease swelling and bruising; then, switch to heat to help relax sore spots and speed recovery. Shower every day.  Short baths are fine.  It feels good!  Keep the incisions and wounds clean with soap & water.   Try Gentle Massage and/or Stretching Massage at the area of pain many times a day Stop if you feel pain - do not overdo it Take over the counter pain medication This helps the muscle and nerve tissues become less irritable and calm down faster Choose ONE of the following over-the-counter anti-inflammatory medications: Acetaminophen 500mg tabs (Tylenol) 1-2 pills with every meal and just before bedtime (avoid if you have liver problems or if you have   acetaminophen in you narcotic prescription) Naproxen 220mg tabs (ex. Aleve, Naprosyn) 1-2 pills twice a day (avoid if you have kidney, stomach, IBD, or bleeding problems) Ibuprofen 200mg tabs (ex. Advil, Motrin) 3-4 pills with every meal and just before bedtime (avoid if you have kidney, stomach, IBD, or bleeding problems) Take with food/snack several times a day as directed for at least 2 weeks to help keep pain / soreness down & more manageable. Take Narcotic prescription pain medication for more severe pain A prescription for strong pain control is often given to you upon discharge (for example: oxycodone/Percocet, hydrocodone/Norco/Vicodin, or tramadol/Ultram) Take your pain medication as prescribed. Be mindful that most narcotic prescriptions contain Tylenol (acetaminophen) as well - avoid taking too much Tylenol. If you are having problems/concerns with the prescription medicine (does  not control pain, nausea, vomiting, rash, itching, etc.), please call us [phone] to see if we need to switch you to a different pain medicine that will work better for you and/or control your side effects better. If you need a refill on your pain medication, you must call the office before 4 pm and on weekdays only.  By federal law, prescriptions for narcotics cannot be called into a pharmacy.  They must be filled out on paper & picked up from our office by the patient or authorized caretaker.  Prescriptions cannot be filled after 4 pm nor on weekends.    WHEN TO CALL US [phone] Severe uncontrolled or worsening pain  Fever over 101 F (38.5 C) Concerns with the incision: Worsening pain, redness, rash/hives, swelling, bleeding, or drainage Reactions / problems with new medications (itching, rash, hives, nausea, etc.) Nausea and/or vomiting Difficulty urinating Difficulty breathing Worsening fatigue, dizziness, lightheadedness, blurred vision Other concerns If you are not getting better after two weeks or are noticing you are getting worse, contact our office [phone] for further advice.  We may need to adjust your medications, re-evaluate you in the office, send you to the emergency room, or see what other things we can do to help. The clinic staff is available to answer your questions during regular business hours (8:30am-5pm).  Please don't hesitate to call and ask to speak to one of our nurses for clinical concerns.    A surgeon from [facility] is always on call at the hospitals 24 hours/day If you have a medical emergency, go to the nearest emergency room or call 911.  FOLLOW UP in our office One the day of your discharge from the hospital (or the next business weekday), please call [facility] to set up or confirm an appointment to see your surgeon in the office for a follow-up appointment.  Usually it is 2-3 weeks after your surgery.   If you  have skin staples at your incision(s), let the office know so we can set up a time in the office for the nurse to remove them (usually around 10 days after surgery). Make sure that you call for appointments the day of discharge (or the next business weekday) from the hospital to ensure a convenient appointment time. IF YOU HAVE DISABILITY OR FAMILY LEAVE FORMS, BRING THEM TO THE OFFICE FOR PROCESSING.  DO NOT GIVE THEM TO YOUR DOCTOR.  [facility] [address], [address], [place], [place]  [address] ? [phone] - Main [phone] - Toll Free,  [phone] - Fax [email or web address]    GETTING TO GOOD BOWEL HEALTH. It is expected for your digestive tract to   need a few months to get back to normal.  It is common for your bowel movements and stools to be irregular.  You will have occasional bloating and cramping that should eventually fade away.  Until you are eating solid food normally, off all pain medications, and back to regular activities; your bowels will not be normal.   Avoiding constipation The goal: ONE SOFT BOWEL MOVEMENT A DAY!    Drink plenty of fluids.  Choose water first. TAKE A FIBER SUPPLEMENT EVERY DAY THE REST OF YOUR LIFE During your first week back home, gradually add back a fiber supplement every day Experiment which form you can tolerate.   There are many forms such as powders, tablets, wafers, gummies, etc Psyllium bran (Metamucil), methylcellulose (Citrucel), Miralax or Glycolax, Benefiber, Flax Seed.  Adjust the dose week-by-week (1/2 dose/day to 6 doses a day) until you are moving your bowels 1-2 times a day.  Cut back the dose or try a different fiber product if it is giving you problems such as diarrhea or bloating. Sometimes a laxative is needed to help jump-start bowels if constipated until the fiber supplement can help regulate your bowels.  If you are tolerating eating & you are farting, it is okay to try a gentle  laxative such as double dose MiraLax, prune juice, or Milk of Magnesia.  Avoid using laxatives too often. Stool softeners can sometimes help counteract the constipating effects of narcotic pain medicines.  It can also cause diarrhea, so avoid using for too long. If you are still constipated despite taking fiber daily, eating solids, and a few doses of laxatives, call our office. Controlling diarrhea Try drinking liquids and eating bland foods for a few days to avoid stressing your intestines further. Avoid dairy products (especially milk & ice cream) for a short time.  The intestines often can lose the ability to digest lactose when stressed. Avoid foods that cause gassiness or bloating.  Typical foods include beans and other legumes, cabbage, broccoli, and dairy foods.  Avoid greasy, spicy, fast foods.  Every person has some sensitivity to other foods, so listen to your body and avoid those foods that trigger problems for you. Probiotics (such as active yogurt, Align, etc) may help repopulate the intestines and colon with normal bacteria and calm down a sensitive digestive tract Adding a fiber supplement gradually can help thicken stools by absorbing excess fluid and retrain the intestines to act more normally.  Slowly increase the dose over a few weeks.  Too much fiber too soon can backfire and cause cramping & bloating. It is okay to try and slow down diarrhea with a few doses of antidiarrheal medicines.   Bismuth subsalicylate (ex. Kayopectate, Pepto Bismol) for a few doses can help control diarrhea.  Avoid if pregnant.   Loperamide (Imodium) can slow down diarrhea.  Start with one tablet (2mg) first.  Avoid if you are having fevers or severe pain.  ILEOSTOMY PATIENTS WILL HAVE CHRONIC DIARRHEA since their colon is not in use.    Drink plenty of liquids.  You will need to drink even more glasses of water/liquid a day to avoid getting dehydrated. Record output from your ileostomy.  Expect to empty  the bag every 3-4 hours at first.  Most people with a permanent ileostomy empty their bag 4-6 times at the least.   Use antidiarrheal medicine (especially Imodium) several times a day to avoid getting dehydrated.  Start with a dose at bedtime & breakfast.  Adjust up or   down as needed.  Increase antidiarrheal medications as directed to avoid emptying the bag more than 8 times a day (every 3 hours). Work with your wound ostomy nurse to learn care for your ostomy.  See ostomy care instructions. TROUBLESHOOTING IRREGULAR BOWELS 1) Start with a soft & bland diet. No spicy, greasy, or fried foods.  2) Avoid gluten/wheat or dairy products from diet to see if symptoms improve. 3) Miralax 17gm or flax seed mixed in 8oz. water or juice-daily. May use 2-4 times a day as needed. 4) Gas-X, Phazyme, etc. as needed for gas & bloating.  5) Prilosec (omeprazole) over-the-counter as needed 6)  Consider probiotics (Align, Activa, etc) to help calm the bowels down  Call your doctor if you are getting worse or not getting better.  Sometimes further testing (cultures, endoscopy, X-ray studies, CT scans, bloodwork, etc.) may be needed to help diagnose and treat the cause of the diarrhea. [facility] [address], [address], [place], [place]  [address] [phone] - Main.    [phone]  - Toll Free.   [phone] - Fax [email or web address]   ###############################   #######################################################  Ostomy Support Information  You've heard that people get along just fine with only one of their eyes, or one of their lungs, or one of their kidneys. But you also know that you have only one intestine and only one bladder, and that leaves you feeling awfully empty, both physically and emotionally: You think no other people go around without part of their intestine with the ends of their intestines sticking out through their abdominal walls.    YOU ARE NOT ALONE.  There are nearly three quarters of a million people in the [place] who have an ostomy; people who have had surgery to remove all or part of their colons or bladders.   There is even a national association, the [employer] with over 350 local affiliated support groups that are organized by volunteers who provide peer support and counseling. [employer] has a toll free telephone num-ber, 800-826-0826 and an educational, interactive website, www.ostomy.org   An ostomy is an opening in the belly (abdominal wall) made by surgery. Ostomates are people who have had this procedure. The opening (stoma) allows the kidney or bowel to grdischarge waste. An external pouch covers the stoma to collect waste. Pouches are are a simple bag and are odor free. Different companies have disposable or reusable pouches to fit one's lifestyle. An ostomy can either be temporary or permanent.   THERE ARE THREE MAIN TYPES OF OSTOMIES Colostomy. A colostomy is a surgically created opening in the large intestine (colon). Ileostomy. An ileostomy is a surgically created opening in the small intestine. Urostomy. A urostomy is a surgically created opening to divert urine away from the bladder.  OSTOMY Care  The following guidelines will make care of your colostomy easier. Keep this information close by for quick reference.  Helpful DIET hints Eat a well-balanced diet including vegetables and fresh fruits. Eat on a regular schedule.  Drink at least 6 to 8 glasses of fluids daily. Eat slowly in a relaxed atmosphere. Chew your food thoroughly. Avoid chewing gum, smoking, and drinking from a straw. This will help decrease the amount of air you swallow, which may help reduce gas. Eating yogurt or drinking buttermilk may help reduce gas.  To control gas at night, do not eat after 8 p.m. This will give your bowel time to quiet down before you go   to bed.  If gas is a problem, you can purchase  Beano. Sprinkle Beano on the first bite of food before eating to reduce gas. It has no flavor and should not change the taste of your food. You can buy Beano over the counter at your local drugstore.  Foods like fish, onions, garlic, broccoli, asparagus, and cabbage produce odor. Although your pouch is odor-proof, if you eat these foods you may notice a stronger odor when emptying your pouch. If this is a concern, you may want to limit these foods in your diet.  If you have an ileostomy, you will have chronic diarrhea & need to drink more liquids to avoid getting dehydrated.  Consider antidiarrheal medicine like imodium (loperamide) or Lomotil to help slow down bowel movements / diarrhea into your ileostomy bag.  GETTING TO GOOD BOWEL HEALTH WITH AN ILEOSTOMY    With the colon bypassed & not in use, you will have small bowel diarrhea.   It is important to thicken & slow your bowel movements down.   The goal: 4-6 small BOWEL MOVEMENTS A DAY It is important to drink plenty of liquids to avoid getting dehydrated  CONTROLLING ILEOSTOMY DIARRHEA  TAKE A FIBER SUPPLEMENT (FiberCon or Benefiner soluble fiber) twice a day - to thicken stools by absorbing excess fluid and retrain the intestines to act more normally.  Slowly increase the dose over a few weeks.  Too much fiber too soon can backfire and cause cramping & bloating.  TAKE AN IRON SUPPLEMENT twice a day to naturally constipate your bowels.  Usually ferrous sulfate 325mg twice a day)  TAKE ANTI-DIARRHEAL MEDICINES: Loperamide (Imodium) can slow down diarrhea.  Start with two tablets (= 4mg) first and then try one tablet every 6 hours.  Can go up to 2 pills four times day (8 pills of 2mg max) Avoid if you are having fevers or severe pain.  If you are not better or start feeling worse, stop all medicines and call your doctor for advice LoMotil (Diphenoxylate / Atropine) is another medicine that can constipate & slow down bowel moevements Pepto  Bismol (bismuth) can gently thicken bowels as well  If diarrhea is worse,: drink plenty of liquids and try simpler foods for a few days to avoid stressing your intestines further. Avoid dairy products (especially milk & ice cream) for a short time.  The intestines often can lose the ability to digest lactose when stressed. Avoid foods that cause gassiness or bloating.  Typical foods include beans and other legumes, cabbage, broccoli, and dairy foods.  Every person has some sensitivity to other foods, so listen to our body and avoid those foods that trigger problems for you.Call your doctor if you are getting worse or not better.  Sometimes further testing (cultures, endoscopy, X-ray studies, bloodwork, etc) may be needed to help diagnose and treat the cause of the diarrhea. Take extra anti-diarrheal medicines (maximum is 8 pills of 2mg loperamide a day)   Tips for POUCHING an OSTOMY   Changing Your Pouch The best time to change your pouch is in the morning, before eating or drinking anything. Your stoma can function at any time, but it will function more after eating or drinking.   Applying the pouching system  Place all your equipment close at hand before removing your pouch.  Wash your hands.  Stand or sit in front of a mirror. Use the position that works best for you. Remember that you must keep the skin around the stoma   wrinkle-free for a good seal.  Gently remove the used pouch (1-piece system) or the pouch and old wafer (2-piece system). Empty the pouch into the toilet. Save the closure clip to use again.  Wash the stoma itself and the skin around the stoma. Your stoma may bleed a little when being washed. This is normal. Rinse and pat dry. You may use a wash cloth or soft paper towels (like Bounty), mild soap (like Dial, Safeguard, or Ivory), and water. Avoid soaps that contain perfumes or lotions.  For a new pouch (1-piece system) or a new wafer (2-piece system), measure your  stoma using the stoma guide in each box of supplies.  Trace the shape of your stoma onto the back of the new pouch or the back of the new wafer. Cut out the opening. Remove the paper backing and set it aside.  Optional: Apply a skin barrier powder to surrounding skin if it is irritated (bare or weeping), and dust off the excess. Optional: Apply a skin-prep wipe (such as Skin Prep or All-Kare) to the skin around the stoma, and let it dry. Do not apply this solution if the skin is irritated (red, tender, or broken) or if you have shaved around the stoma. Optional: Apply a skin barrier paste (such as Stomahesive, Coloplast, or Premium) around the opening cut in the back of the pouch or wafer. Allow it to dry for 30 to 60 seconds.  Hold the pouch (1-piece system) or wafer (2-piece system) with the sticky side toward your body. Make sure the skin around the stoma is wrinkle-free. Center the opening on the stoma, then press firmly to your abdomen (Fig. 4). Look in the mirror to check if you are placing the pouch, or wafer, in the right position. For a 2-piece system, snap the pouch onto the wafer. Make sure it snaps into place securely.  Place your hand over the stoma and the pouch or wafer for about 30 seconds. The heat from your hand can help the pouch or wafer stick to your skin.  Add deodorant (such as Super Banish or Nullo) to your pouch. Other options include food extracts such as vanilla oil and peppermint extract. Add about 10 drops of the deodorant to the pouch. Then apply the closure clamp. Note: Do not use toxic  chemicals or commercial cleaning agents in your pouch. These substances may harm the stoma.  Optional: For extra seal, apply tape to all 4 sides around the pouch or wafer, as if you were framing a picture. You may use any brand of medical adhesive tape. Change your pouch every 5 to 7 days. Change it immediately if a leak occurs.  Wash your hands afterwards.  If you are wearing a  2-piece system, you may use 2 new pouches per week and alternate them. Rinse the pouch with mild soap and warm water and hang it to dry for the next day. Apply the fresh pouch. Alternate the 2 pouches like this for a week. After a week, change the wafer and begin with 2 new pouches. Place the old pouches in a plastic bag, and put them in the trash.   LIVING WITH AN OSTOMY  Emptying Your Pouch Empty your pouch when it is one-third full (of urine, stool, and/or gas). If you wait until your pouch is fuller than this, it will be more difficult to empty and more noticeable. When you empty your pouch, either put toilet paper in the toilet bowl first, or flush the   toilet while you empty the pouch. This will reduce splashing. You can empty the pouch between your legs or to one side while sitting, or while standing or stooping. If you have a 2-piece system, you can snap off the pouch to empty it. Remember that your stoma may function during this time. If you wish to rinse your pouch after you empty it, a turkey baster can be helpful. When using a baster, squirt water up into the pouch through the opening at the bottom. With a 2-piece system, you can snap off the pouch to rinse it. After rinsing  your pouch, empty it into the toilet. When rinsing your pouch at home, put a few granules of Dreft soap in the rinse water. This helps lubricate and freshen your pouch. The inside of your pouch can be sprayed with non-stick cooking oil (Pam spray). This may help reduce stool sticking to the inside of the pouch.  Bathing You may shower or bathe with your pouch on or off. Remember that your stoma may function during this time.  The materials you use to wash your stoma and the skin around it should be clean, but they do not need to be sterile.  Wearing Your Pouch During hot weather, or if you perspire a lot in general, wear a cover over your pouch. This may prevent a rash on your skin under the pouch. Pouch covers are  sold at ostomy supply stores. Wear the pouch inside your underwear for better support. Watch your weight. Any gain or loss of 10 to 15 pounds or more can change the way your pouch fits.  Going Away From Home A collapsible cup (like those that come in travel kits) or a soft plastic squirt bottle with a pull-up top (like a travel bottle for shampoo) can be used for rinsing your pouch when you are away from home. Tilt the opening of the pouch at an upward angle when using a cup to rinse.  Carry wet wipes or extra tissues to use in public bathrooms.  Carry an extra pouching system with you at all times.  Never keep ostomy supplies in the glove compartment of your car. Extreme heat or cold can damage the skin barriers and adhesive wafers on the pouch.  When you travel, carry your ostomy supplies with you at all times. Keep them within easy reach. Do not pack ostomy supplies in baggage that will be checked or otherwise separated from you, because your baggage might be lost. If you're traveling out of the country, it is helpful to have a letter stating that you are carrying ostomy supplies as a medical necessity.  If you need ostomy supplies while traveling, look in the yellow pages of the telephone book under "Surgical Supplies." Or call the local ostomy organization to find out where supplies are available.  Do not let your ostomy supplies get low. Always order new pouches before you use the last one.  Reducing Odor Limit foods such as broccoli, cabbage, onions, fish, and garlic in your diet to help reduce odor. Each time you empty your pouch, carefully clean the opening of the pouch, both inside and outside, with toilet paper. Rinse your pouch 1 or 2 times daily after you empty it (see directions for emptying your pouch and going away from home). Add deodorant (such as Super Banish or Nullo) to your pouch. Use air deodorizers in your bathroom. Do not add aspirin to your pouch. Even though  aspirin can help prevent odor, it   could cause ulcers on your stoma.  When to call the doctor Call the doctor if you have any of the following symptoms: Purple, black, or white stoma Severe cramps lasting more than 6 hours Severe watery discharge from the stoma lasting more than 6 hours No output from the colostomy for 3 days Excessive bleeding from your stoma Swelling of your stoma to more than 1/2-inch larger than usual Pulling inward of your stoma below skin level Severe skin irritation or deep ulcers Bulging or other changes in your abdomen  When to call your ostomy nurse Call your ostomy/enterostomal therapy (WOCN) nurse if any of the following occurs: Frequent leaking of your pouching system Change in size or appearance of your stoma, causing discomfort or problems with your pouch Skin rash or rawness Weight gain or loss that causes problems with your pouch     FREQUENTLY ASKED QUESTIONS   Why haven't you met any of these folks who have an ostomy?  Well, maybe you have! You just did not recognize them because an ostomy doesn't show. It can be kept secret if you wish. Why, maybe some of your best friends, office associates or neighbors have an ostomy ... you never can tell. People facing ostomy surgery have many quality-of-life questions like: Will you bulge? Smell? Make noises? Will you feel waste leaving your body? Will you be a captive of the toilet? Will you starve? Be a social outcast? Get/stay married? Have babies? Easily bathe, go swimming, bend over?  OK, let's look at what you can expect:   Will you bulge?  Remember, without part of the intestine or bladder, and its contents, you should have a flatter tummy than before. You can expect to wear, with little exception, what you wore before surgery ... and this in-cludes tight clothing and bathing Smalls.   Will you smell?  Today, thanks to modern odor proof pouching systems, you can walk into an ostomy support group  meeting and not smell anything that is foul or offensive. And, for those with an ileostomy or colostomy who are concerned about odor when emptying their pouch, there are in-pouch deodorants that can be used to eliminate any waste odors that may exist.   Will you make noises?  Everyone produces gas, especially if they are an air-swallower. But intestinal sounds that occur from time to time are no differ-ent than a gurgling tummy, and quite often your clothing will muffle any sounds.   Will you feel the waste discharges?  For those with a colostomy or ileostomy there might be a slight pressure when waste leaves your body, but understand that the intestines have no nerve endings, so there will be no unpleasant sensations. Those with a urostomy will probably be unaware of any kidney drainage.   Will you be a captive of the toilet?  Immediately post-op you will spend more time in the bathroom than you will after your body recovers from surgery. Every person is different, but on average those with an ileostomy or urostomy may empty their pouches 4 to 6 times a day; a little  less if you have a colostomy. The average wear time between pouch system changes is 3 to 5 days and the changing process should take less than 30 minutes.   Will I need to be on a special diet? Most people return to their normal diet when they have recovered from surgery. Be sure to chew your food well, eat a well-balanced diet and drink plenty of fluids. If   you experience problems with a certain food, wait a couple of weeks and try it again.  Will there be odor and noises? Pouching systems are designed to be odor-proof or odor-resistant. There are deodorants that can be used in the pouch. Medications are also available to help reduce odor. Limit gas-producing foods and carbonated beverages. You will experience less gas and fewer noises as you heal from surgery.  How much time will it take to care for my ostomy? At first, you may  spend a lot of time learning about your ostomy and how to take care of it. As you become more comfortable and skilled at changing the pouching system, it will take very little time to care for it.   Will I be able to return to work? People with ostomies can perform most jobs. As soon as you have healed from surgery, you should be able to return to work. Heavy lifting (more than 10 pounds) may be discouraged.   What about intimacy? Sexual relationships and intimacy are important and fulfilling aspects of your life. They should continue after ostomy surgery. Intimacy-related concerns should be discussed openly between you and your partner.   Can I wear regular clothing? You do not need to wear special clothing. Ostomy pouches are fairly flat and barely noticeable. Elastic undergarments will not hurt the stoma or prevent the ostomy from functioning.   Can I participate in sports? An ostomy should not limit your involvement in sports. Many people with ostomies are runners, skiers, swimmers or participate in other active lifestyles. Talk with your caregiver first before doing heavy physical activity.  Will you starve?  Not if you follow doctor's orders at each stage of your post-op adjustment. There is no such thing as an "ostomy diet". Some people with an ostomy will be able to eat and tolerate anything; others may find diffi-culty with some foods. Each person is an individual and must determine, by trial, what is best for them. A good practice for all is to drink plenty of water.   Will you be a social outcast?  Have you met anyone who has an ostomy and is a social outcast? Why should you be the first? Only your attitude and self image will effect how you are treated. No confi-dent person is an outcast.    PROFESSIONAL HELP   Resources are available if you need help or have questions about your ostomy.   Specially trained nurses called Wound, Ostomy Continence Nurses (WOCN) are available for  consultation in most major medical centers.  Consider getting an ostomy consult at an outpatient ostomy clinic.   [facility] has an Ostomy Clinic run by an WOCN ostomy nurse at the [facility].  [phone]. [facility] can help set up an appointment   The United Ostomy Association (UOA) is a group made up of many local chapters throughout the [place]. These local groups hold meetings and provide support to prospective and existing ostomates. They sponsor educational events and have qualified visitors to make personal or telephone visits. Contact the UOA for the chapter nearest you and for other educational publications.  More detailed information can be found in Colostomy Guide, a publication of the United Ostomy Association (UOA). Contact UOA at 1-800-826-0826 or visit their web site at www.uoaa.org. The website contains links to other sites, suppliers and resources.  [facility] Secure Start Services: Start at the website to enlist for support.  Your Wound Ostomy (WOCN) nurse may have started this   process. https://www.[employer].com/en/securestart Secure Start services are designed to support people as they live their lives with an ostomy or neurogenic bladder. Enrolling is easy and at no cost to the patient. We realize that each person's needs and life journey are different. Through Secure Start services, we want to help people live their life, their way.  #######################################################  

## 2021-05-03 ENCOUNTER — Telehealth: Payer: Self-pay

## 2021-05-03 NOTE — Telephone Encounter (Signed)
Transition Care Management Follow-up Telephone Call Date of discharge and from where: 05/01/21 from [facility] How have you been since you were released from the hospital? "Doing good" Any questions or concerns? No  Items Reviewed: Did the pt receive and understand the discharge instructions provided? Yes  Medications obtained and verified? Yes  Other? No  Any new allergies since your discharge? No  Dietary orders reviewed? Yes Do you have support at home? Yes   Home Care and Equipment/Supplies: Were home health services ordered? no If so, what is the name of the agency? N/a  Has the agency set up a time to come to the patient's home? not applicable Were any new equipment or medical supplies ordered?  No What is the name of the medical supply agency? N/a Were you able to get the supplies/equipment? not applicable Do you have any questions related to the use of the equipment or supplies? No  Functional Questionnaire: (I = Independent and D = Dependent) ADLs: I  Bathing/Dressing- I  Meal Prep- I  Eating- I  Maintaining continence- I  Transferring/Ambulation- I  Managing Meds- I  Follow up appointments reviewed:  PCP Hospital f/u appt confirmed? N/a. [facility] f/u appt confirmed? Yes  Scheduled to see Dr. Joyice Faster on 05/31/21 at  9:30 and Oncologist Dr. Edrick Kins 06/01/21 at 10:20.  Are transportation arrangements needed? No  If their condition worsens, is the pt aware to call PCP or go to the Emergency Dept.? Yes Was the patient provided with contact information for the PCP's office or ED? Yes Was to pt encouraged to call back with questions or concerns? Yes    Thea Silversmith, RN, MSN, BSN, CCM [facility] Care Management Coordinator [phone]  This encounter was created in error - please disregard.

## 2021-05-03 NOTE — Telephone Encounter (Signed)
Transition Care Management Follow-up Telephone Call Date of discharge and from where: 05/01/21 from [facility] How have you been since you were released from the hospital? Doing good Any questions or concerns? No  Items Reviewed: Did the pt receive and understand the discharge instructions provided? Yes  Medications obtained and verified? Yes  Other? No  Any new allergies since your discharge? No  Dietary orders reviewed? Yes Do you have support at home? Yes   Home Care and Equipment/Supplies: Were home health services ordered? no If so, what is the name of the agency? N/a  Has the agency set up a time to come to the patient's home? not applicable Were any new equipment or medical supplies ordered?  No What is the name of the medical supply agency? N/a Were you able to get the supplies/equipment? not applicable Do you have any questions related to the use of the equipment or supplies? No  Functional Questionnaire: (I = Independent and D = Dependent) ADLs: I  Bathing/Dressing- I  Meal Prep- I  Eating- I  Maintaining continence- I  Transferring/Ambulation- I  Managing Meds- I  Follow up appointments reviewed:  PCP Hospital f/u appt confirmed?  Not applicable   [facility] f/u appt confirmed? Yes  Scheduled to see Dr. Joyice Faster on 05/31/21 at 9:30 am and Dr. Edrick Kins on 06/01/21 at 10: 20 am. Are transportation arrangements needed? No  If their condition worsens, is the pt aware to call PCP or go to the Emergency Dept.? Yes Was the patient provided with contact information for the PCP's office or ED? Yes Was to pt encouraged to call back with questions or concerns? Yes    Thea Silversmith, RN, MSN, BSN, [place] Care Management Coordinator [phone]

## 2021-05-11 ENCOUNTER — Other Ambulatory Visit: Payer: Self-pay

## 2021-05-11 NOTE — Progress Notes (Signed)
The proposed treatment discussed in conference is for discussion purpose only and is not a binding recommendation.  The patients have not been physically examined, or presented with their treatment options.  Therefore, final treatment plans cannot be decided.  

## 2021-05-12 ENCOUNTER — Ambulatory Visit: Payer: Managed Care, Other (non HMO) | Admitting: Oncology

## 2021-05-30 ENCOUNTER — Telehealth: Payer: Self-pay | Admitting: *Deleted

## 2021-05-30 ENCOUNTER — Ambulatory Visit: Payer: Managed Care, Other (non HMO) | Admitting: Oncology

## 2021-06-01 ENCOUNTER — Other Ambulatory Visit: Payer: Self-pay

## 2021-06-01 ENCOUNTER — Encounter: Payer: Self-pay | Admitting: *Deleted

## 2021-06-01 ENCOUNTER — Inpatient Hospital Stay: Payer: Managed Care, Other (non HMO) | Attending: Oncology | Admitting: Oncology

## 2021-06-01 VITALS — BP 125/72 | HR 71 | Temp 98.1°F | Resp 18 | Ht 64.0 in | Wt 145.0 lb

## 2021-06-01 DIAGNOSIS — M899 Disorder of bone, unspecified: Secondary | ICD-10-CM

## 2021-06-01 DIAGNOSIS — C187 Malignant neoplasm of sigmoid colon: Secondary | ICD-10-CM

## 2021-06-01 NOTE — Progress Notes (Signed)
PATIENT NAVIGATOR PROGRESS NOTE  Name: Joan Owen Date: 06/01/2021 MRN: [id number]  DOB: July 28, 1956   Reason for visit:  Initial visit with Dr Benay Spice  Comments:  Met Joan Owen during visit with Dr Benay Spice, she will have close F/U with Dr Hilarie Fredrickson and return here for visit with Dr Benay Spice in 6 months with labs (CEA leve) at that time    Time spent counseling/coordinating care: 30-45 minutes

## 2021-06-02 NOTE — Progress Notes (Signed)
[facility] Patient Consult   Requesting MD: Jerene Bears, Md [address],  [place] [address]   Joan Owen 65 y.o.  11-10-1955    Reason for Consult: Colon cancer   HPI: Joan Owen reports a 1 month history of intermittent rectal bleeding and decreased stool caliber.  She was referred to Dr. Hilarie Fredrickson and was taken to a colonoscopy on 03/24/2021.  A large mass was found in the distal sigmoid colon.  Oozing was present.  Multiple biopsies were obtained.  The area was tattooed.  Polyps were removed from the transverse colon and rectum.  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  CTs of the chest, abdomen, and pelvis on 03/28/2021 revealed no suspicious pulmonary nodules.  No suspicious hepatic lesion.  There is a noncircumferential mass in the sigmoid:.  Prominent presacral and sigmoid mesentery lymph nodes, no pathologic enlarged lymph nodes.  Left intertrochanteric sclerotic 8 mm lesion with a tiny hyperdense stellate sclerotic lesion in the left iliac and right ischium.  She was referred to Dr. Marcello Moores and taken to the operating room on 04/29/2021 for a robotic assisted sigmoidectomy.  A large mass was noted in the distal sigmoid colon with associated tattoo.  No evidence of metastatic disease. The pathology confirmed an invasive moderately differentiated adenocarcinoma involving the distal sigmoid colon.  The resection margins are negative.  Tumor invades the subserosa and 0/16 lymph nodes contained metastatic carcinoma.  No lymphovascular perineural invasion.  No tumor deposits.  The tumor returned microsatellite stable with no loss of mismatch repair protein expression.  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  G3, P3  Past Surgical History:  Procedure Laterality Date   BASAL CELL CARCINOMA EXCISION     upper lip   CHOLECYSTECTOMY  08/14/1978   COLONOSCOPY     LAPAROSCOPY  08/15/1983    Medications: Reviewed  Allergies: No Known Allergies  Family history: Her mother had colon cancer in her 64s  Social History:   She lives with her husband in [place].  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  No cigarette use.  She reports using alcohol on the weekends.  No transfusion history.  No risk factor for HIV or hepatitis.  She has received COVID-19 vaccines  ROS:   Positives include: Rectal bleeding for 1 month  A complete ROS was otherwise negative.  Physical Exam:  Blood pressure 125/72, pulse 71, temperature 98.1 F (36.7 C), temperature source Oral, resp. rate 18, height $RemoveBe'5\' 4"'DXRdarnnp$  (1.626 m), weight 145 lb (65.8 kg), last menstrual period 03/28/2013, SpO2 100 %.  HEENT: Neck without mass Lungs: Clear bilaterally Cardiac: Regular rate and rhythm Abdomen: No hepatomegaly, nontender, healed surgical incisions  Vascular: No leg edema Lymph nodes: No cervical, supraclavicular, axillary, or inguinal nodes Neurologic: Alert and oriented, the motor exam appears intact in the upper and lower extremities bilaterally Skin: No rash Musculoskeletal: No spine tenderness   LAB:  CBC  Lab Results  Component Value Date   WBC 6.3 05/01/2021   HGB 10.4 (L) 05/01/2021  HCT 32.2 (L) 05/01/2021   MCV 92.0 05/01/2021   PLT 243 05/01/2021   NEUTROABS 3.2 03/24/2021        CMP  Lab Results  Component Value Date   NA 140 05/01/2021   K 4.1 05/01/2021   CL 104 05/01/2021   CO2 28 05/01/2021   GLUCOSE 86 05/01/2021   BUN 8 05/01/2021   CREATININE 0.56 05/01/2021   CALCIUM 8.6 (L) 05/01/2021   PROT 7.1 03/24/2021   ALBUMIN 4.1 03/24/2021   AST 16  03/24/2021   ALT 17 03/24/2021   ALKPHOS 70 03/24/2021   BILITOT 0.8 03/24/2021   GFRNONAA >60 05/01/2021   CEA on 03/24/2021: 0.8   Imaging: As per HPI, CT images reviewed with Ms. Rosenberg    Assessment/Plan:   Colon cancer, stage IIa (T3 N0 M0), sigmoidectomy 04/29/2021 Colonoscopy 03/24/2021-partially obstructing mass in the sigmoid colon, biopsy-adenocarcinoma CTs 03/28/2021-sigmoid colon mass, prominent presacral and sigmoid mesentery lymph nodes-nonspecific, no evidence of distant metastatic disease, sclerotic lesions at the left intertrochanteric region-likely a bone island Sigmoidectomy 04/29/2021-sigmoid colon tumor, 0/16 lymph nodes, no lymphovascular or perineural invasion, no loss of mismatch repair protein expression, MSS    Disposition:   Joan Owen has been diagnosed with stage II colon cancer.  I discussed the details of the surgery pathology report, prognosis, and adjuvant treatment options with her.  She has a good prognosis for long-term disease-free survival.  We discussed the role of adjuvant chemotherapy in patients with resected colon cancer.  The tumor does not have "high risk" features.  I do not recommend adjuvant chemotherapy in her case.  We discussed diet and exercise maneuvers that may decrease the risk of developing colorectal cancer.  She should follow-up with Dr. Hilarie Fredrickson for colonoscopy surveillance.  Joan Owen does not appear to have hereditary colorectal cancer syndrome.  However her family members are at increased risk of developing colorectal cancer and should receive appropriate screening.  She will return for an office visit and CEA in 6 months.  The pelvic bone lesions noted on the staging CTs are likely benign.  She will discuss the indication for repeat imaging with Dr. Charlett Blake.  Betsy Coder, MD  06/02/2021, 7:02 AM

## 2021-06-13 ENCOUNTER — Other Ambulatory Visit (HOSPITAL_BASED_OUTPATIENT_CLINIC_OR_DEPARTMENT_OTHER): Payer: Self-pay

## 2021-06-13 MED ORDER — FLUARIX QUADRIVALENT 0.5 ML IM SUSY
PREFILLED_SYRINGE | INTRAMUSCULAR | 0 refills | Status: DC
  Filled 2021-06-13: qty 0.5, 1d supply, fill #0

## 2021-07-26 ENCOUNTER — Telehealth: Payer: Self-pay | Admitting: Internal Medicine

## 2021-07-26 NOTE — Telephone Encounter (Signed)
Her hx of sigmoid colon cancer resected in Sept after colonoscopy is noted.  While this is probably hemorrhoidal, I want to have a very low threshold for direct visualization (flex sig)  I recommend hydrocortisone supp 25 mg qHS x 3-5 nights for internal hemorrhoids. Ensure stools are soft and she does not feel constipated.  If constipated add colace 200 mg PO qHS. If persistent symptoms including rectal bleeding (after this treatment) then I recommend flexible sigmoidoscopy.  Please ask her to let us know if there are any persistent symptoms within 2-4 weeks. Thanks JMP.

## 2021-07-26 NOTE — Telephone Encounter (Signed)
Left message for pt to call back.  Pt called back and had surgery done to remove the colon mass in September. She reports she is having some rectal discomfort when she has a BM and thinks this is hemorrhoid related. States it does not happen every time she has a BM. She has brb on the tissue when she wipes. On colon report internal hemorrhoids were mentioned. Please advise.

## 2021-07-26 NOTE — Telephone Encounter (Signed)
Patient called states she has been having some rectal bleeding and she is wanting to check if that is normal from having a procedure in September.

## 2021-07-27 ENCOUNTER — Other Ambulatory Visit: Payer: Self-pay

## 2021-07-27 ENCOUNTER — Other Ambulatory Visit (HOSPITAL_BASED_OUTPATIENT_CLINIC_OR_DEPARTMENT_OTHER): Payer: Self-pay

## 2021-07-27 MED ORDER — HYDROCORTISONE ACETATE 25 MG RE SUPP
25.0000 mg | Freq: Every day | RECTAL | 0 refills | Status: DC
Start: 2021-07-27 — End: 2021-11-07
  Filled 2021-07-27: qty 12, 12d supply, fill #0

## 2021-07-27 NOTE — Telephone Encounter (Signed)
Spoke with pt and she is aware. Prescription sent to pharmacy. 

## 2021-08-19 ENCOUNTER — Encounter: Payer: Self-pay | Admitting: Family Medicine

## 2021-11-04 NOTE — Progress Notes (Deleted)
? ?Subjective:  ? ? Patient ID: Joan Owen, female    DOB: 29-Apr-1956, 66 y.o.   MRN: [id number] ? ?No chief complaint on file. ? ? ?HPI ?Patient is in today for her annual physical exam. ? ?Past Medical History:  ?Diagnosis Date  ? Anemia   ? BCC (basal cell carcinoma), face 01/05/2015  ? Lip  ? Colon polyps   ? hyperplastic  ? Endometriosis   ? Gallstones 1981  ? History of chicken pox   ? Osteopenia 01/05/2015  ? PONV (postoperative nausea and vomiting)   ? Right shoulder pain 01/05/2015  ? ? ?Past Surgical History:  ?Procedure Laterality Date  ? BASAL CELL CARCINOMA EXCISION    ? upper lip  ? CHOLECYSTECTOMY  08/14/1978  ? COLONOSCOPY    ? LAPAROSCOPY  08/15/1983  ? ? ?Family History  ?Problem Relation Age of Onset  ? Dementia Mother   ?     Alzheimer  ? Alzheimer's disease Mother   ? Heart disease Father   ? Dementia Sister   ? Alzheimer's disease Sister   ? Thyroid nodules Daughter   ? Colon cancer Neg Hx   ? Esophageal cancer Neg Hx   ? Rectal cancer Neg Hx   ? Stomach cancer Neg Hx   ? ? ?Social History  ? ?Socioeconomic History  ? Marital status: Married  ?  Spouse name: Not on file  ? Number of children: 3  ? Years of education: Not on file  ? Highest education level: Not on file  ?Occupational History  ? Occupation: Futures trader  ?Tobacco Use  ? Smoking status: Never  ? Smokeless tobacco: Never  ?Vaping Use  ? Vaping Use: Never used  ?Substance and Sexual Activity  ? Alcohol use: Not Currently  ?  Alcohol/week: 2.0 standard drinks  ?  Types: 2 Glasses of wine per week  ? Drug use: No  ? Sexual activity: Not on file  ?Other Topics Concern  ? Not on file  ?Social History Narrative  ? Not on file  ? ?Social Determinants of Health  ? ?Financial Resource Strain: Not on file  ?Food Insecurity: Not on file  ?Transportation Needs: Not on file  ?Physical Activity: Not on file  ?Stress: Not on file  ?Social Connections: Not on file  ?Intimate Partner Violence: Not on file  ? ? ?Outpatient Medications Prior to  Visit  ?Medication Sig Dispense Refill  ? CALCIUM-VITAMIN D PO Take 1 tablet by mouth daily.    ? hydrocortisone (ANUSOL-HC) 25 MG suppository Unwrap and place 1 suppository (25 mg total) rectally at bedtime. 12 suppository 0  ? influenza vac split quadrivalent PF (FLUARIX QUADRIVALENT) 0.5 ML injection Inject into the muscle. 0.5 mL 0  ? Multiple Vitamins-Minerals (MULTIVITAMIN WITH MINERALS) tablet Take 1 tablet by mouth daily.    ? ?No facility-administered medications prior to visit.  ? ? ?No Known Allergies ? ?ROS ? ?   ?Objective:  ?  ?Physical Exam ? ?LMP 03/28/2013  ?Wt Readings from Last 3 Encounters:  ?06/01/21 145 lb (65.8 kg)  ?04/29/21 139 lb (63 kg)  ?04/20/21 139 lb (63 kg)  ? ? ?Diabetic Foot Exam - Simple   ?No data filed ?  ? ?Lab Results  ?Component Value Date  ? WBC 6.3 05/01/2021  ? HGB 10.4 (L) 05/01/2021  ? HCT 32.2 (L) 05/01/2021  ? PLT 243 05/01/2021  ? GLUCOSE 86 05/01/2021  ? CHOL 225 (H) 11/02/2020  ? TRIG 127.0 11/02/2020  ?  HDL 57.00 11/02/2020  ? LDLDIRECT 103.0 11/04/2019  ? LDLCALC 142 (H) 11/02/2020  ? ALT 17 03/24/2021  ? AST 16 03/24/2021  ? NA 140 05/01/2021  ? K 4.1 05/01/2021  ? CL 104 05/01/2021  ? CREATININE 0.56 05/01/2021  ? BUN 8 05/01/2021  ? CO2 28 05/01/2021  ? TSH 1.09 11/02/2020  ? ? ?Lab Results  ?Component Value Date  ? TSH 1.09 11/02/2020  ? ?Lab Results  ?Component Value Date  ? WBC 6.3 05/01/2021  ? HGB 10.4 (L) 05/01/2021  ? HCT 32.2 (L) 05/01/2021  ? MCV 92.0 05/01/2021  ? PLT 243 05/01/2021  ? ?Lab Results  ?Component Value Date  ? NA 140 05/01/2021  ? K 4.1 05/01/2021  ? CO2 28 05/01/2021  ? GLUCOSE 86 05/01/2021  ? BUN 8 05/01/2021  ? CREATININE 0.56 05/01/2021  ? BILITOT 0.8 03/24/2021  ? ALKPHOS 70 03/24/2021  ? AST 16 03/24/2021  ? ALT 17 03/24/2021  ? PROT 7.1 03/24/2021  ? ALBUMIN 4.1 03/24/2021  ? CALCIUM 8.6 (L) 05/01/2021  ? ANIONGAP 8 05/01/2021  ? GFR 92.07 03/24/2021  ? ?Lab Results  ?Component Value Date  ? CHOL 225 (H) 11/02/2020  ? ?Lab Results   ?Component Value Date  ? HDL 57.00 11/02/2020  ? ?Lab Results  ?Component Value Date  ? LDLCALC 142 (H) 11/02/2020  ? ?Lab Results  ?Component Value Date  ? TRIG 127.0 11/02/2020  ? ?Lab Results  ?Component Value Date  ? CHOLHDL 4 11/02/2020  ? ?No results found for: HGBA1C ? ?   ?Assessment & Plan:  ? ?Problem List Items Addressed This Visit   ?None ?Visit Diagnoses   ? ? Encounter for annual physical exam    -  Primary  ? ?  ? ? ?I am having Shine Scrogham maintain her multivitamin with minerals, CALCIUM-VITAMIN D PO, Fluarix Quadrivalent, and hydrocortisone. ? ?No orders of the defined types were placed in this encounter. ? ? ? ?

## 2021-11-07 ENCOUNTER — Ambulatory Visit (INDEPENDENT_AMBULATORY_CARE_PROVIDER_SITE_OTHER): Payer: Managed Care, Other (non HMO) | Admitting: Family Medicine

## 2021-11-07 ENCOUNTER — Encounter: Payer: Self-pay | Admitting: Family Medicine

## 2021-11-07 VITALS — BP 118/70 | HR 72 | Resp 20 | Ht 64.0 in | Wt 149.0 lb

## 2021-11-07 DIAGNOSIS — M810 Age-related osteoporosis without current pathological fracture: Secondary | ICD-10-CM

## 2021-11-07 DIAGNOSIS — E559 Vitamin D deficiency, unspecified: Secondary | ICD-10-CM

## 2021-11-07 DIAGNOSIS — Z0000 Encounter for general adult medical examination without abnormal findings: Secondary | ICD-10-CM

## 2021-11-07 DIAGNOSIS — Z85038 Personal history of other malignant neoplasm of large intestine: Secondary | ICD-10-CM | POA: Diagnosis not present

## 2021-11-07 DIAGNOSIS — E785 Hyperlipidemia, unspecified: Secondary | ICD-10-CM

## 2021-11-07 NOTE — Patient Instructions (Addendum)
Recommend calcium intake of 1200 to 1500 mg daily, divided into roughly 3 doses. Best source is the diet and a single dairy serving is about 500 mg, a supplement of calcium citrate once or twice daily to balance diet is fine if not getting enough in diet. Also need Vitamin D 2000 IU caps, 1 cap daily if not already taking vitamin D. Also recommend weight baring exercise on hips and upper body to keep bones strong  ?Preventive Care 49-65 Years Old, Female ?Preventive care refers to lifestyle choices and visits with your health care provider that can promote health and wellness. Preventive care visits are also called wellness exams. ?What can I expect for my preventive care visit? ?Counseling ?Your health care provider may ask you questions about your: ?Medical history, including: ?Past medical problems. ?Family medical history. ?Pregnancy history. ?Current health, including: ?Menstrual cycle. ?Method of birth control. ?Emotional well-being. ?Home life and relationship well-being. ?Sexual activity and sexual health. ?Lifestyle, including: ?Alcohol, nicotine or tobacco, and drug use. ?Access to firearms. ?Diet, exercise, and sleep habits. ?Work and work Statistician. ?Sunscreen use. ?Safety issues such as seatbelt and bike helmet use. ?Physical exam ?Your health care provider will check your: ?Height and weight. These may be used to calculate your BMI (body mass index). BMI is a measurement that tells if you are at a healthy weight. ?Waist circumference. This measures the distance around your waistline. This measurement also tells if you are at a healthy weight and may help predict your risk of certain diseases, such as type 2 diabetes and high blood pressure. ?Heart rate and blood pressure. ?Body temperature. ?Skin for abnormal spots. ?What immunizations do I need? ?Vaccines are usually given at various ages, according to a schedule. Your health care provider will recommend vaccines for you based on your age, medical  history, and lifestyle or other factors, such as travel or where you work. ?What tests do I need? ?Screening ?Your health care provider may recommend screening tests for certain conditions. This may include: ?Lipid and cholesterol levels. ?Diabetes screening. This is done by checking your blood sugar (glucose) after you have not eaten for a while (fasting). ?Pelvic exam and Pap test. ?Hepatitis B test. ?Hepatitis C test. ?HIV (human immunodeficiency virus) test. ?STI (sexually transmitted infection) testing, if you are at risk. ?Lung cancer screening. ?Colorectal cancer screening. ?Mammogram. Talk with your health care provider about when you should start having regular mammograms. This may depend on whether you have a family history of breast cancer. ?BRCA-related cancer screening. This may be done if you have a family history of breast, ovarian, tubal, or peritoneal cancers. ?Bone density scan. This is done to screen for osteoporosis. ?Talk with your health care provider about your test results, treatment options, and if necessary, the need for more tests. ?Follow these instructions at home: ?Eating and drinking ? ?Eat a diet that includes fresh fruits and vegetables, whole grains, lean protein, and low-fat dairy products. ?Take vitamin and mineral supplements as recommended by your health care provider. ?Do not drink alcohol if: ?Your health care provider tells you not to drink. ?You are pregnant, may be pregnant, or are planning to become pregnant. ?If you drink alcohol: ?Limit how much you have to 0-1 drink a day. ?Know how much alcohol is in your drink. In the [place] one drink equals one 12 oz bottle of beer (355 mL), one 5 oz glass of wine (148 mL), or one 1? oz glass of hard liquor (44 mL). ?Lifestyle ?Brush your  teeth every morning and night with fluoride toothpaste. Floss one time each day. ?Exercise for at least 30 minutes 5 or more days each week. ?Do not use any products that contain nicotine or tobacco.  These products include cigarettes, chewing tobacco, and vaping devices, such as e-cigarettes. If you need help quitting, ask your health care provider. ?Do not use drugs. ?If you are sexually active, practice safe sex. Use a condom or other form of protection to prevent STIs. ?If you do not wish to become pregnant, use a form of birth control. If you plan to become pregnant, see your health care provider for a prepregnancy visit. ?Take aspirin only as told by your health care provider. Make sure that you understand how much to take and what form to take. Work with your health care provider to find out whether it is safe and beneficial for you to take aspirin daily. ?Find healthy ways to manage stress, such as: ?Meditation, yoga, or listening to music. ?Journaling. ?Talking to a trusted person. ?Spending time with friends and family. ?Minimize exposure to UV radiation to reduce your risk of skin cancer. ?Safety ?Always wear your seat belt while driving or riding in a vehicle. ?Do not drive: ?If you have been drinking alcohol. Do not ride with someone who has been drinking. ?When you are tired or distracted. ?While texting. ?If you have been using any mind-altering substances or drugs. ?Wear a helmet and other protective equipment during sports activities. ?If you have firearms in your house, make sure you follow all gun safety procedures. ?Seek help if you have been physically or sexually abused. ?What's next? ?Visit your health care provider once a year for an annual wellness visit. ?Ask your health care provider how often you should have your eyes and teeth checked. ?Stay up to date on all vaccines. ?This information is not intended to replace advice given to you by your health care provider. Make sure you discuss any questions you have with your health care provider. ?Document Revised: 01/26/2021 Document Reviewed: 01/26/2021 ?Elsevier Patient Education ? [employer]. ? ?

## 2021-11-07 NOTE — Addendum Note (Signed)
Addended by: Kelle Darting A on: 11/07/2021 03:29 PM ? ? Modules accepted: Orders ? ?

## 2021-11-07 NOTE — Assessment & Plan Note (Signed)
Patient encouraged to maintain heart healthy diet, regular exercise, adequate sleep. Consider daily probiotics. Take medications as prescribed. Labs ordered and reviewed. Follows with OB/GYN for pap, MGM and Dexa. Will request records, has new MGM scheduled for later this week. Colonoscopy just completed showed colon cancer and required surgery she is doing  Much better now. Encouraged to proceed with Shingrix shots ?

## 2021-11-07 NOTE — Assessment & Plan Note (Signed)
Supplement and monitor 

## 2021-11-07 NOTE — Assessment & Plan Note (Signed)
Encourage heart healthy diet such as MIND or DASH diet, increase exercise, avoid trans fats, simple carbohydrates and processed foods, consider a krill or fish or flaxseed oil cap daily.  °

## 2021-11-07 NOTE — Assessment & Plan Note (Signed)
Encouraged to get adequate exercise, calcium and vitamin d intake. Follows with physician's for women for Dexa scans ?

## 2021-11-07 NOTE — Assessment & Plan Note (Signed)
Tolerated her surgery well and is doing well. Did not require adjuvant therapy but is established with medical oncology for surveillance ?

## 2021-11-07 NOTE — Progress Notes (Signed)
? ?Subjective:  ? ?By signing my name below, I, Joan Owen, attest that this documentation has been prepared under the direction and in the presence of Joan Lukes, MD. 11/07/2021  ?   ? ? Patient ID: Joan Owen, female    DOB: 01/26/1956, 66 y.o.   MRN: [id number] ? ?Chief Complaint  ?Patient presents with  ? Annual Exam  ? ? ?HPI ?Patient is in today for a comprehensive physical exam. ? ?She is doing well at this time. ? ?She had stage 2 colon cancer last summer and had a surgery to excise the tumor. No radiation or chemo. ? ?She will be getting a mammogram and dexa scan on 03/29. ? ?She denies fever, congestion, eye pain, chest pain, palpitations, leg swelling, shortness of breath, nausea, abdominal pain, diarrhea and blood in stool. Also denies dysuria, frequency, back pain and headaches.  ? ?She is UTD on tetanus vaccine. Not UTD on shingles vaccine. She has 4 Covid-19 vaccines at this time. ? ?Her parents died in Oct 31, 2020. Her 62 year old sister has Alzheimer's disease. Her sister and brother have skin cancer.  ? ?Past Medical History:  ?Diagnosis Date  ? Anemia   ? BCC (basal cell carcinoma), face 01/05/2015  ? Lip  ? Colon polyps   ? hyperplastic  ? Endometriosis   ? Gallstones 1981  ? History of chicken pox   ? Osteopenia 01/05/2015  ? PONV (postoperative nausea and vomiting)   ? Right shoulder pain 01/05/2015  ? ? ?Past Surgical History:  ?Procedure Laterality Date  ? BASAL CELL CARCINOMA EXCISION    ? upper lip  ? CHOLECYSTECTOMY  08/14/1978  ? COLONOSCOPY    ? LAPAROSCOPY  08/15/1983  ? ? ?Family History  ?Problem Relation Age of Onset  ? Dementia Mother   ?     Alzheimer  ? Alzheimer's disease Mother   ? Heart disease Father   ? Cancer Sister   ?     skin  ? Dementia Sister   ? Alzheimer's disease Sister   ? Cancer Brother   ?     skin  ? Thyroid nodules Daughter   ? Colon cancer Neg Hx   ? Esophageal cancer Neg Hx   ? Rectal cancer Neg Hx   ? Stomach cancer Neg Hx   ? ? ?Social History   ? ?Socioeconomic History  ? Marital status: Married  ?  Spouse name: Not on file  ? Number of children: 3  ? Years of education: Not on file  ? Highest education level: Not on file  ?Occupational History  ? Occupation: Futures trader  ?Tobacco Use  ? Smoking status: Never  ? Smokeless tobacco: Never  ?Vaping Use  ? Vaping Use: Never used  ?Substance and Sexual Activity  ? Alcohol use: Not Currently  ?  Alcohol/week: 2.0 standard drinks  ?  Types: 2 Glasses of wine per week  ? Drug use: No  ? Sexual activity: Not on file  ?Other Topics Concern  ? Not on file  ?Social History Narrative  ? Not on file  ? ?Social Determinants of Health  ? ?Financial Resource Strain: Not on file  ?Food Insecurity: Not on file  ?Transportation Needs: Not on file  ?Physical Activity: Not on file  ?Stress: Not on file  ?Social Connections: Not on file  ?Intimate Partner Violence: Not on file  ? ? ?Outpatient Medications Prior to Visit  ?Medication Sig Dispense Refill  ? CALCIUM-VITAMIN D PO Take 1  tablet by mouth daily.    ? Multiple Vitamins-Minerals (MULTIVITAMIN WITH MINERALS) tablet Take 1 tablet by mouth daily.    ? hydrocortisone (ANUSOL-HC) 25 MG suppository Unwrap and place 1 suppository (25 mg total) rectally at bedtime. 12 suppository 0  ? influenza vac split quadrivalent PF (FLUARIX QUADRIVALENT) 0.5 ML injection Inject into the muscle. 0.5 mL 0  ? ?No facility-administered medications prior to visit.  ? ? ?No Known Allergies ? ?Review of Systems  ?Constitutional:  Negative for fever.  ?HENT:  Negative for congestion.   ?Eyes:  Negative for pain.  ?Respiratory:  Negative for shortness of breath.   ?Cardiovascular:  Negative for chest pain, palpitations and leg swelling.  ?Gastrointestinal:  Negative for abdominal pain, blood in stool, diarrhea and nausea.  ?Genitourinary:  Negative for dysuria and frequency.  ?Musculoskeletal:  Negative for back pain.  ?Neurological:  Negative for headaches.  ? ?   ?Objective:  ?  ?Physical  Exam ?Constitutional:   ?   General: She is not in acute distress. ?   Appearance: She is well-developed.  ?HENT:  ?   Head: Normocephalic and atraumatic.  ?   Right Ear: Tympanic membrane, ear canal and external ear normal.  ?   Left Ear: Tympanic membrane, ear canal and external ear normal.  ?Eyes:  ?   Extraocular Movements:  ?   Right eye: No nystagmus.  ?   Left eye: No nystagmus.  ?   Conjunctiva/sclera: Conjunctivae normal.  ?Neck:  ?   Thyroid: No thyromegaly.  ?Cardiovascular:  ?   Rate and Rhythm: Normal rate and regular rhythm.  ?   Heart sounds: Normal heart sounds. No murmur heard. ?Pulmonary:  ?   Effort: Pulmonary effort is normal. No respiratory distress.  ?   Breath sounds: Normal breath sounds.  ?Abdominal:  ?   General: Bowel sounds are normal. There is no distension.  ?   Palpations: Abdomen is soft. There is no mass.  ?   Tenderness: There is no abdominal tenderness.  ?Musculoskeletal:  ?   Cervical back: Neck supple.  ?   Comments: 5/5 strength in upper and lower extremities   ?Lymphadenopathy:  ?   Cervical: No cervical adenopathy.  ?Skin: ?   General: Skin is warm and dry.  ?Neurological:  ?   Mental Status: She is alert and oriented to person, place, and time.  ?Psychiatric:     ?   Behavior: Behavior normal.  ? ? ?BP 118/70 (BP Location: Left Arm, Patient Position: Sitting, Cuff Size: Normal)   Pulse 72   Resp 20   Ht '5\' 4"'$  (1.626 m)   Wt 149 lb (67.6 kg)   LMP 03/28/2013   SpO2 98%   BMI 25.58 kg/m?  ?Wt Readings from Last 3 Encounters:  ?11/07/21 149 lb (67.6 kg)  ?06/01/21 145 lb (65.8 kg)  ?04/29/21 139 lb (63 kg)  ? ? ?Diabetic Foot Exam - Simple   ?No data filed ?  ? ?Lab Results  ?Component Value Date  ? WBC 6.3 05/01/2021  ? HGB 10.4 (L) 05/01/2021  ? HCT 32.2 (L) 05/01/2021  ? PLT 243 05/01/2021  ? GLUCOSE 86 05/01/2021  ? CHOL 225 (H) 11/02/2020  ? TRIG 127.0 11/02/2020  ? HDL 57.00 11/02/2020  ? LDLDIRECT 103.0 11/04/2019  ? LDLCALC 142 (H) 11/02/2020  ? ALT 17 03/24/2021   ? AST 16 03/24/2021  ? NA 140 05/01/2021  ? K 4.1 05/01/2021  ? CL 104 05/01/2021  ?  CREATININE 0.56 05/01/2021  ? BUN 8 05/01/2021  ? CO2 28 05/01/2021  ? TSH 1.09 11/02/2020  ? ? ?Lab Results  ?Component Value Date  ? TSH 1.09 11/02/2020  ? ?Lab Results  ?Component Value Date  ? WBC 6.3 05/01/2021  ? HGB 10.4 (L) 05/01/2021  ? HCT 32.2 (L) 05/01/2021  ? MCV 92.0 05/01/2021  ? PLT 243 05/01/2021  ? ?Lab Results  ?Component Value Date  ? NA 140 05/01/2021  ? K 4.1 05/01/2021  ? CO2 28 05/01/2021  ? GLUCOSE 86 05/01/2021  ? BUN 8 05/01/2021  ? CREATININE 0.56 05/01/2021  ? BILITOT 0.8 03/24/2021  ? ALKPHOS 70 03/24/2021  ? AST 16 03/24/2021  ? ALT 17 03/24/2021  ? PROT 7.1 03/24/2021  ? ALBUMIN 4.1 03/24/2021  ? CALCIUM 8.6 (L) 05/01/2021  ? ANIONGAP 8 05/01/2021  ? GFR 92.07 03/24/2021  ? ?Lab Results  ?Component Value Date  ? CHOL 225 (H) 11/02/2020  ? ?Lab Results  ?Component Value Date  ? HDL 57.00 11/02/2020  ? ?Lab Results  ?Component Value Date  ? LDLCALC 142 (H) 11/02/2020  ? ?Lab Results  ?Component Value Date  ? TRIG 127.0 11/02/2020  ? ?Lab Results  ?Component Value Date  ? CHOLHDL 4 11/02/2020  ? ?No results found for: HGBA1C ? ?   ? ?Colonoscopy- Last completed on 03/24/2021. There was a partially obstructing tumor in the distal sigmoid colon that was biopsied. Polyps in the transverse colon and rectum were resected and retrieved. There was diverticulosis in the sigmoid and distal descending colon. Small internal hemorrhoids. Repeat in 1 year. ?Mammogram- Last checked on 07/16/2019. Results were normal. Repeat in 1 year. DUE ?Pap smear- Last checked on 10/21/2014. Results were normal.  ?Dexa- Last checked on 07/17/2019. Patient was osteoporotic according to the [facility]. Repeat in 2 years. DUE ? ?Assessment & Plan:  ? ?Problem List Items Addressed This Visit   ? ? Preventative health care  ?  Patient encouraged to maintain heart healthy diet, regular exercise, adequate sleep. Consider  daily probiotics. Take medications as prescribed. Labs ordered and reviewed. Follows with OB/GYN for pap, MGM and Dexa. Will request records, has new MGM scheduled for later this week. Colonoscopy just completed showed colon

## 2021-11-08 ENCOUNTER — Other Ambulatory Visit (INDEPENDENT_AMBULATORY_CARE_PROVIDER_SITE_OTHER): Payer: Managed Care, Other (non HMO)

## 2021-11-08 DIAGNOSIS — Z0000 Encounter for general adult medical examination without abnormal findings: Secondary | ICD-10-CM | POA: Diagnosis not present

## 2021-11-08 DIAGNOSIS — E559 Vitamin D deficiency, unspecified: Secondary | ICD-10-CM

## 2021-11-08 LAB — COMPREHENSIVE METABOLIC PANEL
ALT: 14 U/L (ref 0–35)
AST: 16 U/L (ref 0–37)
Albumin: 4.3 g/dL (ref 3.5–5.2)
Alkaline Phosphatase: 55 U/L (ref 39–117)
BUN: 18 mg/dL (ref 6–23)
CO2: 30 mEq/L (ref 19–32)
Calcium: 9.4 mg/dL (ref 8.4–10.5)
Chloride: 103 mEq/L (ref 96–112)
Creatinine, Ser: 0.87 mg/dL (ref 0.40–1.20)
GFR: 70.37 mL/min (ref >60.00)
Glucose, Bld: 88 mg/dL (ref 70–99)
Potassium: 4.1 mEq/L (ref 3.5–5.1)
Sodium: 139 mEq/L (ref 135–145)
Total Bilirubin: 0.9 mg/dL (ref 0.2–1.2)
Total Protein: 6.7 g/dL (ref 6.0–8.3)

## 2021-11-08 LAB — VITAMIN D 25 HYDROXY (VIT D DEFICIENCY, FRACTURES): VITD: 30.53 ng/mL (ref 30.00–100.00)

## 2021-11-08 LAB — CBC WITH DIFFERENTIAL/PLATELET
Basophils Absolute: 0 10*3/uL (ref 0.0–0.1)
Basophils Relative: 0.8 % (ref 0.0–3.0)
Eosinophils Absolute: 0.1 10*3/uL (ref 0.0–0.7)
Eosinophils Relative: 2.7 % (ref 0.0–5.0)
HCT: 39.2 % (ref 36.0–46.0)
Hemoglobin: 13.2 g/dL (ref 12.0–15.0)
Lymphocytes Relative: 41.7 % (ref 12.0–46.0)
Lymphs Abs: 1.5 10*3/uL (ref 0.7–4.0)
MCHC: 33.7 g/dL (ref 30.0–36.0)
MCV: 92.6 fl (ref 78.0–100.0)
Monocytes Absolute: 0.3 10*3/uL (ref 0.1–1.0)
Monocytes Relative: 8.3 % (ref 3.0–12.0)
Neutro Abs: 1.7 10*3/uL (ref 1.4–7.7)
Neutrophils Relative %: 46.5 % (ref 43.0–77.0)
Platelets: 215 10*3/uL (ref 150.0–400.0)
RBC: 4.23 Mil/uL (ref 3.87–5.11)
RDW: 13.2 % (ref 11.5–15.5)
WBC: 3.6 10*3/uL — ABNORMAL LOW (ref 4.0–10.5)

## 2021-11-08 LAB — LIPID PANEL
Cholesterol: 191 mg/dL (ref 0–200)
HDL: 58.2 mg/dL (ref >39.00)
LDL Cholesterol: 104 mg/dL — ABNORMAL HIGH (ref 0–99)
NonHDL: 132.98
Total CHOL/HDL Ratio: 3
Triglycerides: 143 mg/dL (ref 0.0–149.0)
VLDL: 28.6 mg/dL (ref 0.0–40.0)

## 2021-11-08 LAB — VITAMIN B12: Vitamin B-12: 402 pg/mL (ref 211–911)

## 2021-11-09 LAB — HM MAMMOGRAPHY

## 2021-11-30 ENCOUNTER — Inpatient Hospital Stay: Payer: Managed Care, Other (non HMO)

## 2021-11-30 ENCOUNTER — Inpatient Hospital Stay: Payer: Managed Care, Other (non HMO) | Attending: Oncology | Admitting: Oncology

## 2021-11-30 ENCOUNTER — Telehealth: Payer: Self-pay

## 2021-11-30 VITALS — BP 113/84 | HR 62 | Temp 97.9°F | Resp 18 | Ht 64.0 in | Wt 151.8 lb

## 2021-11-30 DIAGNOSIS — C187 Malignant neoplasm of sigmoid colon: Secondary | ICD-10-CM | POA: Diagnosis present

## 2021-11-30 LAB — CEA (ACCESS): CEA (CHCC): 2.83 ng/mL (ref 0.00–5.00)

## 2021-11-30 NOTE — Progress Notes (Signed)
?  [facility] ?OFFICE PROGRESS NOTE ? ? ?Diagnosis: Colon cancer ? ?INTERVAL HISTORY:  ? ?Ms. Kochanski returns as scheduled.  She feels well.  Good appetite.  No difficulty with bowel function.  No bleeding. ? ?Objective: ? ?Vital signs in last 24 hours: ? ?Blood pressure 113/84, pulse 62, temperature 97.9 ?F (36.6 ?C), temperature source Oral, resp. rate 18, height '5\' 4"'$  (1.626 m), weight 151 lb 12.8 oz (68.9 kg), last menstrual period 03/28/2013, SpO2 100 %. ?  ? ? ?Lymphatics: No cervical, supraclavicular, axillary, or inguinal nodes ?Resp: Lungs clear bilaterally ?Cardio: Regular rate and rhythm ?GI: Nontender, no mass, no hepatosplenomegaly ?Vascular: No leg edema ?  ? ?Lab Results: ? ?Lab Results  ?Component Value Date  ? WBC 3.6 (L) 11/08/2021  ? HGB 13.2 11/08/2021  ? HCT 39.2 11/08/2021  ? MCV 92.6 11/08/2021  ? PLT 215.0 11/08/2021  ? NEUTROABS 1.7 11/08/2021  ? ? ?CMP  ?Lab Results  ?Component Value Date  ? NA 139 11/08/2021  ? K 4.1 11/08/2021  ? CL 103 11/08/2021  ? CO2 30 11/08/2021  ? GLUCOSE 88 11/08/2021  ? BUN 18 11/08/2021  ? CREATININE 0.87 11/08/2021  ? CALCIUM 9.4 11/08/2021  ? PROT 6.7 11/08/2021  ? ALBUMIN 4.3 11/08/2021  ? AST 16 11/08/2021  ? ALT 14 11/08/2021  ? ALKPHOS 55 11/08/2021  ? BILITOT 0.9 11/08/2021  ? GFRNONAA >60 05/01/2021  ? ? ?Lab Results  ?Component Value Date  ? CEA 0.8 03/24/2021  ? ?Medications: I have reviewed the patient's current medications. ? ? ?Assessment/Plan: ?Colon cancer, stage IIa (T3 N0 M0), sigmoidectomy 04/29/2021 ?Colonoscopy 03/24/2021-partially obstructing mass in the sigmoid colon, biopsy-adenocarcinoma ?CTs 03/28/2021-sigmoid colon mass, prominent presacral and sigmoid mesentery lymph nodes-nonspecific, no evidence of distant metastatic disease, sclerotic lesions at the left intertrochanteric region-likely a bone island ?Sigmoidectomy 04/29/2021-sigmoid colon tumor, 0/16 lymph nodes, no lymphovascular or perineural invasion, no loss of mismatch  repair protein expression, MSS ? ? ? ?Disposition: ?Ms. Spang is in clinical remission from colon cancer.  We will follow-up on the CEA from today.  She will be due for a surveillance colonoscopy later this year.  She will return for an office visit and CEA in 6 months. ? ?Betsy Coder, MD ? ?11/30/2021  ?9:37 AM ? ? ?

## 2021-11-30 NOTE — Telephone Encounter (Signed)
V/M message left for Pt. Informed if she had any further problems or concerns to give a return call to office. ?

## 2021-11-30 NOTE — Telephone Encounter (Signed)
-----   Message from Ladell Pier, MD sent at 11/30/2021  3:17 PM EDT ----- ?Please call patient, CEA is normal, follow-up as scheduled ? ?

## 2021-12-15 ENCOUNTER — Encounter: Payer: Self-pay | Admitting: Internal Medicine

## 2021-12-16 ENCOUNTER — Other Ambulatory Visit: Payer: Self-pay

## 2021-12-16 ENCOUNTER — Other Ambulatory Visit (HOSPITAL_BASED_OUTPATIENT_CLINIC_OR_DEPARTMENT_OTHER): Payer: Self-pay

## 2021-12-16 MED ORDER — HYDROCORTISONE ACETATE 25 MG RE SUPP
25.0000 mg | Freq: Every day | RECTAL | 0 refills | Status: DC
  Filled 2021-12-16: qty 13, 13d supply, fill #0

## 2021-12-16 NOTE — Telephone Encounter (Signed)
Can give hydrocortisone suppository 25 mg nightly x3-5 nights ?#20, 1 refill ? ? ?

## 2022-02-08 ENCOUNTER — Encounter: Payer: Self-pay | Admitting: Internal Medicine

## 2022-04-03 ENCOUNTER — Encounter: Payer: Managed Care, Other (non HMO) | Admitting: Internal Medicine

## 2022-04-05 ENCOUNTER — Other Ambulatory Visit (HOSPITAL_BASED_OUTPATIENT_CLINIC_OR_DEPARTMENT_OTHER): Payer: Self-pay

## 2022-04-05 ENCOUNTER — Ambulatory Visit (AMBULATORY_SURGERY_CENTER): Payer: Self-pay

## 2022-04-05 VITALS — Ht 64.0 in | Wt 155.0 lb

## 2022-04-05 DIAGNOSIS — Z85038 Personal history of other malignant neoplasm of large intestine: Secondary | ICD-10-CM

## 2022-04-05 MED ORDER — SUTAB 1479-225-188 MG PO TABS
1.0000 | ORAL_TABLET | ORAL | 0 refills | Status: DC
  Filled 2022-04-05: qty 24, 1d supply, fill #0
  Filled 2022-04-20: qty 24, 2d supply, fill #0

## 2022-04-05 NOTE — Progress Notes (Signed)
No egg or soy allergy known to patient  No issues known to pt with past sedation with any surgeries or procedures Patient denies ever being told they had issues or difficulty with intubation  No FH of Malignant Hyperthermia Pt is not on diet pills Pt is not on  home 02  Pt is not on blood thinners  Pt denies issues with constipation  No A fib or A flutter Have any cardiac testing pending--denied Pt instructed to use [email or web address].[email or web address] or GoodRx for a price reduction on prep   

## 2022-04-11 ENCOUNTER — Encounter: Payer: Self-pay | Admitting: Internal Medicine

## 2022-04-14 ENCOUNTER — Other Ambulatory Visit (HOSPITAL_BASED_OUTPATIENT_CLINIC_OR_DEPARTMENT_OTHER): Payer: Self-pay

## 2022-04-20 ENCOUNTER — Other Ambulatory Visit (HOSPITAL_BASED_OUTPATIENT_CLINIC_OR_DEPARTMENT_OTHER): Payer: Self-pay

## 2022-04-21 ENCOUNTER — Other Ambulatory Visit (HOSPITAL_BASED_OUTPATIENT_CLINIC_OR_DEPARTMENT_OTHER): Payer: Self-pay

## 2022-04-27 ENCOUNTER — Ambulatory Visit (AMBULATORY_SURGERY_CENTER): Payer: Medicare Other | Admitting: Internal Medicine

## 2022-04-27 ENCOUNTER — Encounter: Payer: Self-pay | Admitting: Internal Medicine

## 2022-04-27 VITALS — BP 123/73 | HR 60 | Temp 98.9°F | Resp 18 | Ht 64.0 in | Wt 155.0 lb

## 2022-04-27 DIAGNOSIS — D123 Benign neoplasm of transverse colon: Secondary | ICD-10-CM | POA: Diagnosis not present

## 2022-04-27 DIAGNOSIS — Z85038 Personal history of other malignant neoplasm of large intestine: Secondary | ICD-10-CM | POA: Diagnosis not present

## 2022-04-27 DIAGNOSIS — D122 Benign neoplasm of ascending colon: Secondary | ICD-10-CM

## 2022-04-27 DIAGNOSIS — Z08 Encounter for follow-up examination after completed treatment for malignant neoplasm: Secondary | ICD-10-CM

## 2022-04-27 MED ORDER — SODIUM CHLORIDE 0.9 % IV SOLN: 500.0000 mL | Freq: Once | INTRAVENOUS | Status: DC

## 2022-04-27 NOTE — Progress Notes (Signed)
GASTROENTEROLOGY PROCEDURE H&P NOTE   Primary Care Physician: [name], MD    Reason for Procedure:  Personal history of colon cancer  Plan:    Colonoscopy  Patient is appropriate for endoscopic procedure(s) in the ambulatory ([facility]) setting.  The nature of the procedure, as well as the risks, benefits, and alternatives were carefully and thoroughly reviewed with the patient. Ample time for discussion and questions allowed. The patient understood, was satisfied, and agreed to proceed.     HPI: Joan Owen is a 66 y.o. female who presents for surveillance colonoscopy.  Medical history as below.  Tolerated the prep.  No recent chest pain or shortness of breath.  No abdominal pain today.  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 Authorizing Provider  CALCIUM-VITAMIN D PO Take 1 tablet by mouth daily.   Yes [provider]  Multiple Vitamins-Minerals (MULTIVITAMIN WITH MINERALS) tablet Take 1 tablet by mouth daily.   Yes [provider]    Current Outpatient Medications  Medication Sig Dispense Refill   CALCIUM-VITAMIN D PO Take 1 tablet by mouth daily.     Multiple Vitamins-Minerals (MULTIVITAMIN WITH MINERALS) tablet Take 1 tablet by mouth daily.     Current Facility-Administered Medications  Medication Dose Route Frequency Provider Last Rate Last Admin   0.9 %  sodium chloride  infusion  500 mL Intravenous Once Darrien Laakso, Lajuan Lines, MD        Allergies as of 04/27/2022   (No Known Allergies)    Family History  Problem Relation Age of Onset   Dementia Mother        Alzheimer   Alzheimer's disease Mother    Heart disease Father    Cancer Sister        skin   Dementia Sister    Alzheimer's disease Sister    Cancer Brother        skin   Thyroid nodules Daughter    Colon cancer Neg Hx    Esophageal cancer Neg Hx    Rectal cancer Neg Hx    Stomach cancer Neg Hx    Colon polyps Neg Hx     Social History   Socioeconomic History   Marital status: Married    Spouse name: Not on file   Number of children: 3   Years of education: Not on file   Highest education level: Not on file  Occupational History   Occupation: Futures trader  Tobacco Use   Smoking status: Never   Smokeless tobacco: Never  Vaping Use   Vaping Use: Never used  Substance and Sexual Activity   Alcohol use: Not Currently    Alcohol/week: 2.0 standard drinks of alcohol    Types: 2 Glasses of wine per week   Drug use: No   Sexual activity: Not  on file  Other Topics Concern   Not on file  Social History Narrative   Not on file   Social Determinants of Health   Financial Resource Strain: Not on file  Food Insecurity: Not on file  Transportation Needs: Not on file  Physical Activity: Not on file  Stress: Not on file  Social Connections: Not on file  Intimate Partner Violence: Not on file    Physical Exam: Vital signs in last 24 hours: '@BP'$  114/75   Pulse 80   Temp 98.9 F (37.2 C)   Ht '5\' 4"'$  (1.626 m)   Wt 155 lb (70.3 kg)   LMP 03/28/2013   SpO2 98%   BMI 26.61 kg/m  GEN: NAD EYE: Sclerae anicteric ENT: MMM CV: Non-tachycardic Pulm: CTA b/l GI: Soft, NT/ND NEURO:  Alert & Oriented x 3   Zenovia Jarred, MD [facility]  04/27/2022 9:13 AM

## 2022-04-27 NOTE — Patient Instructions (Signed)
Please read handouts provided. Continue present medications. Await pathology results. Repeat colonoscopy in 3 years for screening.  YOU HAD AN ENDOSCOPIC PROCEDURE TODAY AT THE [facility]:   Refer to the procedure report that was given to you for any specific questions about what was found during the examination.  If the procedure report does not answer your questions, please call your gastroenterologist to clarify.  If you requested that your care partner not be given the details of your procedure findings, then the procedure report has been included in a sealed envelope for you to review at your convenience later.  YOU SHOULD EXPECT: Some feelings of bloating in the abdomen. Passage of more gas than usual.  Walking can help get rid of the air that was put into your GI tract during the procedure and reduce the bloating. If you had a lower endoscopy (such as a colonoscopy or flexible sigmoidoscopy) you may notice spotting of blood in your stool or on the toilet paper. If you underwent a bowel prep for your procedure, you may not have a normal bowel movement for a few days.  Please Note:  You might notice some irritation and congestion in your nose or some drainage.  This is from the oxygen used during your procedure.  There is no need for concern and it should clear up in a day or so.  SYMPTOMS TO REPORT IMMEDIATELY:  Following lower endoscopy (colonoscopy or flexible sigmoidoscopy):  Excessive amounts of blood in the stool  Significant tenderness or worsening of abdominal pains  Swelling of the abdomen that is new, acute  Fever of 100F or higher.   For urgent or emergent issues, a gastroenterologist can be reached at any hour by calling [phone]. Do not use MyChart messaging for urgent concerns.    DIET:  We do recommend a small meal at first, but then you may proceed to your regular diet.  Drink plenty of fluids but you should avoid alcoholic beverages for 24  hours.  ACTIVITY:  You should plan to take it easy for the rest of today and you should NOT DRIVE or use heavy machinery until tomorrow (because of the sedation medicines used during the test).    FOLLOW UP: Our staff will call the number listed on your records the next business day following your procedure.  We will call around 7:15- 8:00 am to check on you and address any questions or concerns that you may have regarding the information given to you following your procedure. If we do not reach you, we will leave a message.     If any biopsies were taken you will be contacted by phone or by letter within the next 1-3 weeks.  Please call us at [phone] if you have not heard about the biopsies in 3 weeks.    SIGNATURES/CONFIDENTIALITY: You and/or your care partner have signed paperwork which will be entered into your electronic medical record.  These signatures attest to the fact that that the information above on your After Visit Summary has been reviewed and is understood.  Full responsibility of the confidentiality of this discharge information lies with you and/or your care-partner. 

## 2022-04-27 NOTE — Op Note (Signed)
[facility] Patient Name: Joan Owen Procedure Date: 04/27/2022 9:18 AM MRN: [id number] Endoscopist: Jerene Bears , MD Age: 66 Referring MD:  Date of Birth: 17-Nov-1955 Gender: Female Account #: [id number] Procedure:                Colonoscopy Indications:              High risk colon cancer surveillance: Personal                            history of colon cancer of the sigmoid, s/p sigmoid                            resection Sept 2022; last colonoscopy Aug 2022 Medicines:                Monitored Anesthesia Care Procedure:                Pre-Anesthesia Assessment:                           - Prior to the procedure, a History and Physical                            was performed, and patient medications and                            allergies were reviewed. The patient's tolerance of                            previous anesthesia was also reviewed. The risks                            and benefits of the procedure and the sedation                            options and risks were discussed with the patient.                            All questions were answered, and informed consent                            was obtained. Prior Anticoagulants: The patient has                            taken no previous anticoagulant or antiplatelet                            agents. ASA Grade Assessment: II - A patient with                            mild systemic disease. After reviewing the risks                            and benefits, the patient was deemed in  satisfactory condition to undergo the procedure.                           After obtaining informed consent, the colonoscope                            was passed under direct vision. Throughout the                            procedure, the patient's blood pressure, pulse, and                            oxygen saturations were monitored continuously. The                            Olympus PCF-H190DL  (#[id number]) Colonoscope was                            introduced through the anus and advanced to the                            cecum, identified by appendiceal orifice and                            ileocecal valve. The colonoscopy was performed                            without difficulty. The patient tolerated the                            procedure well. The quality of the bowel                            preparation was excellent. The ileocecal valve,                            appendiceal orifice, and rectum were photographed. Scope In: 9:21:58 AM Scope Out: 9:34:55 AM Scope Withdrawal Time: 0 hours 10 minutes 58 seconds  Total Procedure Duration: 0 hours 12 minutes 57 seconds  Findings:                 The digital rectal exam was normal.                           A 5 mm polyp was found in the ascending colon. The                            polyp was sessile. The polyp was removed with a                            cold snare. Resection and retrieval were complete.                           A 5 mm polyp was found in the transverse colon.  The                            polyp was sessile. The polyp was removed with a                            cold snare. Resection and retrieval were complete.                           Multiple small-mouthed diverticula were found in                            the proximal sigmoid colon and descending colon.                           There was evidence of a prior end-to-end                            colo-colonic anastomosis in the distal sigmoid                            colon. This was patent and was characterized by                            healthy appearing mucosa.                           Anal papilla(e) were hypertrophied. Complications:            No immediate complications. Estimated Blood Loss:     Estimated blood loss was minimal. Impression:               - One 5 mm polyp in the ascending colon, removed                             with a cold snare. Resected and retrieved.                           - One 5 mm polyp in the transverse colon, removed                            with a cold snare. Resected and retrieved.                           - Moderate diverticulosis in the proximal sigmoid                            colon and in the descending colon.                           - Patent end-to-end colo-colonic anastomosis,                            characterized by healthy appearing mucosa.                           -  Anal papillae were hypertrophied on retroflexed                            views of the rectum. Retroflexion otherwise normal. Recommendation:           - Patient has a contact number available for                            emergencies. The signs and symptoms of potential                            delayed complications were discussed with the                            patient. Return to normal activities tomorrow.                            Written discharge instructions were provided to the                            patient.                           - Resume previous diet.                           - Continue present medications.                           - Await pathology results.                           - Repeat colonoscopy in 3 years for surveillance. Jerene Bears, MD 04/27/2022 9:40:16 AM This report has been signed electronically.

## 2022-04-27 NOTE — Progress Notes (Signed)
PT taken to PACU. Monitors in place. VSS. Report given to RN. 

## 2022-04-27 NOTE — Progress Notes (Signed)
Called to room to assist during endoscopic procedure.  Patient ID and intended procedure confirmed with present staff. Received instructions for my participation in the procedure from the performing physician.  

## 2022-04-28 ENCOUNTER — Telehealth: Payer: Self-pay

## 2022-04-28 NOTE — Telephone Encounter (Signed)
No answer, left message to call if having any issues or concerns, B.Arsema Tusing RN 

## 2022-05-01 DIAGNOSIS — Z1283 Encounter for screening for malignant neoplasm of skin: Secondary | ICD-10-CM | POA: Diagnosis not present

## 2022-05-01 DIAGNOSIS — L570 Actinic keratosis: Secondary | ICD-10-CM | POA: Diagnosis not present

## 2022-05-01 DIAGNOSIS — D225 Melanocytic nevi of trunk: Secondary | ICD-10-CM | POA: Diagnosis not present

## 2022-05-01 DIAGNOSIS — L820 Inflamed seborrheic keratosis: Secondary | ICD-10-CM | POA: Diagnosis not present

## 2022-05-01 LAB — SURGICAL PATHOLOGY

## 2022-05-02 ENCOUNTER — Encounter: Payer: Self-pay | Admitting: Internal Medicine

## 2022-05-31 ENCOUNTER — Inpatient Hospital Stay (HOSPITAL_BASED_OUTPATIENT_CLINIC_OR_DEPARTMENT_OTHER): Payer: Medicare Other | Admitting: Oncology

## 2022-05-31 ENCOUNTER — Telehealth: Payer: Self-pay | Admitting: *Deleted

## 2022-05-31 ENCOUNTER — Inpatient Hospital Stay: Payer: Medicare Other | Attending: Oncology

## 2022-05-31 VITALS — BP 122/75 | HR 87 | Temp 98.2°F | Resp 18 | Ht 64.0 in | Wt 156.6 lb

## 2022-05-31 DIAGNOSIS — Z85038 Personal history of other malignant neoplasm of large intestine: Secondary | ICD-10-CM | POA: Diagnosis not present

## 2022-05-31 DIAGNOSIS — C187 Malignant neoplasm of sigmoid colon: Secondary | ICD-10-CM | POA: Diagnosis not present

## 2022-05-31 DIAGNOSIS — Z9049 Acquired absence of other specified parts of digestive tract: Secondary | ICD-10-CM | POA: Insufficient documentation

## 2022-05-31 LAB — CEA (ACCESS): CEA (CHCC): 8.54 ng/mL — ABNORMAL HIGH (ref 0.00–5.00)

## 2022-05-31 NOTE — Progress Notes (Signed)
  [facility] OFFICE PROGRESS NOTE   Diagnosis: Colon cancer  INTERVAL HISTORY:   Ms. Dascenzo returns as scheduled.  She feels well.  No difficulty with bowel function.  No complaint.  She had "hemorrhoid "bleeding 9 or 10 months ago.  This has resolved.  She underwent a colonoscopy 04/27/2022.  Polyps were removed from the ascending and transverse colon.  The pathology revealed tubular adenomas.  No high-grade dysplasia.  Objective:  Vital signs in last 24 hours:  Blood pressure 122/75, pulse 87, temperature 98.2 F (36.8 C), temperature source Oral, resp. rate 18, height $RemoveBe'5\' 4"'tAJEHWmOc$  (1.626 m), weight 156 lb 9.6 oz (71 kg), last menstrual period 03/28/2013, SpO2 98 %.     Lymphatics: No cervical, supraclavicular, axillary, or inguinal nodes Resp: Lungs clear bilaterally Cardio: Regular rate and rhythm GI: No hepatosplenomegaly, no mass, nontender Vascular: No leg edema  Lab Results:  Lab Results  Component Value Date   WBC 3.6 (L) 11/08/2021   HGB 13.2 11/08/2021   HCT 39.2 11/08/2021   MCV 92.6 11/08/2021   PLT 215.0 11/08/2021   NEUTROABS 1.7 11/08/2021    CMP  Lab Results  Component Value Date   NA 139 11/08/2021   K 4.1 11/08/2021   CL 103 11/08/2021   CO2 30 11/08/2021   GLUCOSE 88 11/08/2021   BUN 18 11/08/2021   CREATININE 0.87 11/08/2021   CALCIUM 9.4 11/08/2021   PROT 6.7 11/08/2021   ALBUMIN 4.3 11/08/2021   AST 16 11/08/2021   ALT 14 11/08/2021   ALKPHOS 55 11/08/2021   BILITOT 0.9 11/08/2021   GFRNONAA >60 05/01/2021    Lab Results  Component Value Date   CEA 2.83 11/30/2021     Medications: I have reviewed the patient's current medications.   Assessment/Plan: Colon cancer, stage IIa (T3 N0 M0), sigmoidectomy 04/29/2021 Colonoscopy 03/24/2021-partially obstructing mass in the sigmoid colon, biopsy-adenocarcinoma CTs 03/28/2021-sigmoid colon mass, prominent presacral and sigmoid mesentery lymph nodes-nonspecific, no evidence of  distant metastatic disease, sclerotic lesions at the left intertrochanteric region-likely a bone island Sigmoidectomy 04/29/2021-sigmoid colon tumor, 0/16 lymph nodes, no lymphovascular or perineural invasion, no loss of mismatch repair protein expression, MSS Colonoscopy 04/27/2022-polyps removed from the ascending and transverse colon-tubular adenomas     Disposition: Ms. Scrima remains in clinical remission from colon cancer.  We will follow-up on the CEA from today.  She will return for an office visit and CEA in 6 months.  Betsy Coder, MD  05/31/2022  9:30 AM

## 2022-05-31 NOTE — Telephone Encounter (Signed)
Left VM for patient to check MyChart for MD interpretation of mildly elevated CEA. Will recheck in 1 month. Scheduler will call.

## 2022-06-12 ENCOUNTER — Encounter: Payer: Self-pay | Admitting: Family Medicine

## 2022-06-28 ENCOUNTER — Inpatient Hospital Stay: Payer: Medicare Other | Attending: Oncology

## 2022-06-28 DIAGNOSIS — C187 Malignant neoplasm of sigmoid colon: Secondary | ICD-10-CM

## 2022-06-28 DIAGNOSIS — Z85038 Personal history of other malignant neoplasm of large intestine: Secondary | ICD-10-CM | POA: Insufficient documentation

## 2022-06-28 DIAGNOSIS — K869 Disease of pancreas, unspecified: Secondary | ICD-10-CM | POA: Diagnosis not present

## 2022-06-28 DIAGNOSIS — R970 Elevated carcinoembryonic antigen [CEA]: Secondary | ICD-10-CM | POA: Diagnosis not present

## 2022-06-28 LAB — CEA (ACCESS): CEA (CHCC): 9.48 ng/mL — ABNORMAL HIGH (ref 0.00–5.00)

## 2022-06-29 ENCOUNTER — Telehealth: Payer: Self-pay | Admitting: *Deleted

## 2022-06-29 DIAGNOSIS — C187 Malignant neoplasm of sigmoid colon: Secondary | ICD-10-CM

## 2022-06-29 LAB — BASIC METABOLIC PANEL - CANCER CENTER ONLY
Anion gap: 11 (ref 5–15)
BUN: 18 mg/dL (ref 8–23)
CO2: 28 mmol/L (ref 22–32)
Calcium: 9.7 mg/dL (ref 8.9–10.3)
Chloride: 105 mmol/L (ref 98–111)
Creatinine: 0.89 mg/dL (ref 0.44–1.00)
GFR, Estimated: >60 mL/min (ref >60)
Glucose, Bld: 98 mg/dL (ref 70–99)
Potassium: 4.4 mmol/L (ref 3.5–5.1)
Sodium: 144 mmol/L (ref 135–145)

## 2022-06-29 NOTE — Telephone Encounter (Signed)
Called Joan Owen with rise in CEA and that MD suggests CT scan ~ 2 weeks with OV 1-2 days afterwards. She agrees to this plan and provides following dates she is available: 11/27, 11/28 and any day week of 12/4.

## 2022-07-10 ENCOUNTER — Ambulatory Visit (HOSPITAL_BASED_OUTPATIENT_CLINIC_OR_DEPARTMENT_OTHER)
Admission: RE | Admit: 2022-07-10 | Discharge: 2022-07-10 | Disposition: A | Discharge status: Home / Self Care | Payer: Medicare Other | Source: Ambulatory Visit | Attending: Oncology | Admitting: Oncology

## 2022-07-10 DIAGNOSIS — C189 Malignant neoplasm of colon, unspecified: Secondary | ICD-10-CM | POA: Diagnosis not present

## 2022-07-10 DIAGNOSIS — C187 Malignant neoplasm of sigmoid colon: Secondary | ICD-10-CM | POA: Insufficient documentation

## 2022-07-10 DIAGNOSIS — K769 Liver disease, unspecified: Secondary | ICD-10-CM | POA: Diagnosis not present

## 2022-07-10 MED ORDER — IOHEXOL 300 MG/ML  SOLN
100.0000 mL | Freq: Once | INTRAMUSCULAR | Status: AC | PRN
  Administered 2022-07-10: 80 mL via INTRAVENOUS

## 2022-07-13 ENCOUNTER — Inpatient Hospital Stay: Payer: Medicare Other | Admitting: Nurse Practitioner

## 2022-07-13 ENCOUNTER — Telehealth: Payer: Self-pay

## 2022-07-13 ENCOUNTER — Encounter: Payer: Self-pay | Admitting: Nurse Practitioner

## 2022-07-13 VITALS — BP 133/74 | HR 86 | Temp 98.2°F | Resp 18 | Ht 64.0 in | Wt 152.8 lb

## 2022-07-13 DIAGNOSIS — K769 Liver disease, unspecified: Secondary | ICD-10-CM | POA: Diagnosis not present

## 2022-07-13 DIAGNOSIS — C187 Malignant neoplasm of sigmoid colon: Secondary | ICD-10-CM

## 2022-07-13 DIAGNOSIS — K869 Disease of pancreas, unspecified: Secondary | ICD-10-CM | POA: Diagnosis not present

## 2022-07-13 DIAGNOSIS — R970 Elevated carcinoembryonic antigen [CEA]: Secondary | ICD-10-CM | POA: Diagnosis not present

## 2022-07-13 DIAGNOSIS — Z85038 Personal history of other malignant neoplasm of large intestine: Secondary | ICD-10-CM | POA: Diagnosis not present

## 2022-07-13 NOTE — Progress Notes (Signed)
  [facility] OFFICE PROGRESS NOTE   Diagnosis: Colon cancer  INTERVAL HISTORY:   Joan Owen returns prior to scheduled follow-up.  She was last seen 05/31/2022.  The CEA returned elevated at 8.54.  Repeat CEA 06/28/2022 was 9.48.  She was referred for CT scans, completed 07/10/2022, showing a new lesion in the central left hepatic lobe, no adenopathy in the abdomen or pelvis.  She feels well.  No complaints.  She had a bowel movement after the CT scan and saw bright red blood.  No further bleeding.  Objective:  Vital signs in last 24 hours:  Blood pressure 133/74, pulse 86, temperature 98.2 F (36.8 C), temperature source Oral, resp. rate 18, height 5' 4" (1.626 m), weight 152 lb 12.8 oz (69.3 kg), last menstrual period 03/28/2013, SpO2 100 %.    Lymphatics: No palpable cervical, supraclavicular, axillary or inguinal lymph nodes. Resp: Lungs clear bilaterally. Cardio: Regular rate and rhythm. GI: Abdomen soft and nontender.  No hepatosplenomegaly. Vascular: No leg edema.    Lab Results:  Lab Results  Component Value Date   WBC 3.6 (L) 11/08/2021   HGB 13.2 11/08/2021   HCT 39.2 11/08/2021   MCV 92.6 11/08/2021   PLT 215.0 11/08/2021   NEUTROABS 1.7 11/08/2021    Imaging:  No results found.  Medications: I have reviewed the patient's current medications.  Assessment/Plan: Colon cancer, stage IIa (T3 N0 M0), sigmoidectomy 04/29/2021 Colonoscopy 03/24/2021-partially obstructing mass in the sigmoid colon, biopsy-adenocarcinoma CTs 03/28/2021-sigmoid colon mass, prominent presacral and sigmoid mesentery lymph nodes-nonspecific, no evidence of distant metastatic disease, sclerotic lesions at the left intertrochanteric region-likely a bone island Sigmoidectomy 04/29/2021-sigmoid colon tumor, 0/16 lymph nodes, no lymphovascular or perineural invasion, no loss of mismatch repair protein expression, MSS Colonoscopy 04/27/2022-polyps removed from the ascending and  transverse colon-tubular adenomas 05/31/2022 CEA 8.54 06/28/2022 CEA 9.48 07/10/2022 CTs-new lesion central left hepatic lobe  Disposition: Joan Owen appears stable.  She has a history of stage IIa colon cancer dating to August/September 2022.  Recent CEA returned mildly elevated.  CT scans show an isolated liver lesion.  We are referring her for liver MRI.  Her case will be presented at the upcoming GI tumor conference.  Referral made to Dr. Zenia Resides.  Joan Owen will return for follow-up here on 07/20/2022.  Patient seen with Dr. Benay Spice.  Ned Card ANP/GNP-BC   07/13/2022  1:52 PM  This was a shared visit with Ned Card.  Joan Owen was interviewed and examined.  We reviewed the CT findings and images with her.  Her husband was present for today's visit.  The CEA is mildly elevated.  The staging CTs reveal an isolated liver lesion.  She understands the high likelihood the liver lesion represents a metastasis from colon cancer.  We discussed treatment options.  She will be referred for an MRI of the liver and surgical consultation.  I will present her case at the GI tumor conference.  I was present for greater than 50% of today's visit.  I performed medical decision making.  Julieanne Manson, MD

## 2022-07-13 NOTE — Telephone Encounter (Signed)
Patient is schedule for MR at [facility] on 12/3 at 4. She is aware of the appointment.

## 2022-07-16 ENCOUNTER — Ambulatory Visit (HOSPITAL_COMMUNITY)
Admission: RE | Admit: 2022-07-16 | Discharge: 2022-07-16 | Disposition: A | Discharge status: Home / Self Care | Payer: Medicare Other | Source: Ambulatory Visit | Attending: Nurse Practitioner | Admitting: Nurse Practitioner

## 2022-07-16 DIAGNOSIS — R160 Hepatomegaly, not elsewhere classified: Secondary | ICD-10-CM | POA: Diagnosis not present

## 2022-07-16 DIAGNOSIS — C187 Malignant neoplasm of sigmoid colon: Secondary | ICD-10-CM | POA: Insufficient documentation

## 2022-07-16 DIAGNOSIS — Z9049 Acquired absence of other specified parts of digestive tract: Secondary | ICD-10-CM | POA: Diagnosis not present

## 2022-07-16 DIAGNOSIS — Z8719 Personal history of other diseases of the digestive system: Secondary | ICD-10-CM | POA: Diagnosis not present

## 2022-07-16 DIAGNOSIS — C189 Malignant neoplasm of colon, unspecified: Secondary | ICD-10-CM | POA: Diagnosis not present

## 2022-07-16 MED ORDER — GADOBUTROL 1 MMOL/ML IV SOLN
7.0000 mL | Freq: Once | INTRAVENOUS | Status: AC | PRN
  Administered 2022-07-16: 7 mL via INTRAVENOUS

## 2022-07-17 ENCOUNTER — Encounter: Payer: Self-pay | Admitting: *Deleted

## 2022-07-17 NOTE — Progress Notes (Signed)
Faxed referral order, demographics, MRI report and last office note to [facility]-Dr. Zenia Resides [phone]

## 2022-07-18 ENCOUNTER — Telehealth: Payer: Self-pay

## 2022-07-18 NOTE — Telephone Encounter (Signed)
-----   Message from Ladell Pier, MD sent at 07/17/2022  5:30 PM EST ----- Please call patient the MRI confirms an isolated liver lesion, proceed with surgical evaluation, follow-up as scheduled

## 2022-07-18 NOTE — Telephone Encounter (Signed)
Patient gave verbal understanding and schedule to come in on 07/19/22.

## 2022-07-19 ENCOUNTER — Other Ambulatory Visit: Payer: Self-pay

## 2022-07-19 NOTE — Progress Notes (Signed)
The proposed treatment discussed in conference is for discussion purpose only and is not a binding recommendation.  The patients have not been physically examined, or presented with their treatment options.  Therefore, final treatment plans cannot be decided.  

## 2022-07-20 ENCOUNTER — Encounter: Payer: Self-pay | Admitting: Nurse Practitioner

## 2022-07-20 ENCOUNTER — Other Ambulatory Visit (HOSPITAL_BASED_OUTPATIENT_CLINIC_OR_DEPARTMENT_OTHER): Payer: Self-pay

## 2022-07-20 ENCOUNTER — Inpatient Hospital Stay: Payer: Medicare Other | Attending: Oncology | Admitting: Nurse Practitioner

## 2022-07-20 ENCOUNTER — Encounter: Payer: Self-pay | Admitting: *Deleted

## 2022-07-20 VITALS — BP 140/81 | HR 74 | Temp 98.2°F | Resp 16 | Wt 149.3 lb

## 2022-07-20 DIAGNOSIS — R160 Hepatomegaly, not elsewhere classified: Secondary | ICD-10-CM | POA: Diagnosis not present

## 2022-07-20 DIAGNOSIS — Z452 Encounter for adjustment and management of vascular access device: Secondary | ICD-10-CM | POA: Insufficient documentation

## 2022-07-20 DIAGNOSIS — C189 Malignant neoplasm of colon, unspecified: Secondary | ICD-10-CM | POA: Insufficient documentation

## 2022-07-20 DIAGNOSIS — C787 Secondary malignant neoplasm of liver and intrahepatic bile duct: Secondary | ICD-10-CM | POA: Insufficient documentation

## 2022-07-20 DIAGNOSIS — F419 Anxiety disorder, unspecified: Secondary | ICD-10-CM | POA: Insufficient documentation

## 2022-07-20 DIAGNOSIS — K869 Disease of pancreas, unspecified: Secondary | ICD-10-CM | POA: Insufficient documentation

## 2022-07-20 DIAGNOSIS — K769 Liver disease, unspecified: Secondary | ICD-10-CM | POA: Diagnosis not present

## 2022-07-20 DIAGNOSIS — Z5111 Encounter for antineoplastic chemotherapy: Secondary | ICD-10-CM | POA: Diagnosis not present

## 2022-07-20 DIAGNOSIS — C187 Malignant neoplasm of sigmoid colon: Secondary | ICD-10-CM

## 2022-07-20 MED ORDER — ALPRAZOLAM 0.5 MG PO TABS
0.5000 mg | ORAL_TABLET | Freq: Two times a day (BID) | ORAL | 0 refills | Status: DC | PRN
  Filled 2022-07-20: qty 40, 20d supply, fill #0

## 2022-07-20 NOTE — Progress Notes (Signed)
PATIENT NAVIGATOR PROGRESS NOTE  Name: Joan Owen Date: 07/20/2022 MRN: [id number]  DOB: 1956-04-19   Reason for visit:  F/U visit and referral to [facility] surgery  Comments:  Met with Mr and Mrs Shrider during visit with Ned Card NP Referral to [facility] with Dr Fayrene Helper, appt made for 07/24/22 at 1200, pt given address and phone number of clinic Request to [facility] for images to be uploaded into [facility] for Dr Fayrene Helper.    Time spent counseling/coordinating care: 30-45 minutes

## 2022-07-20 NOTE — Progress Notes (Signed)
  [facility] OFFICE PROGRESS NOTE   Diagnosis: Colon cancer  INTERVAL HISTORY:   Joan Owen returns as scheduled.  She feels well.  No complaints except anxiety related to current health concerns.  Objective:  Vital signs in last 24 hours:  Blood pressure (!) 140/81, pulse 74, temperature 98.2 F (36.8 C), temperature source Temporal, resp. rate 16, weight 149 lb 4.8 oz (67.7 kg), last menstrual period 03/28/2013, SpO2 100 %.    Resp: Lungs clear bilaterally. Cardio: Regular rate and rhythm. GI: No hepatosplenomegaly. Vascular: No leg edema.   Lab Results:  Lab Results  Component Value Date   WBC 3.6 (L) 11/08/2021   HGB 13.2 11/08/2021   HCT 39.2 11/08/2021   MCV 92.6 11/08/2021   PLT 215.0 11/08/2021   NEUTROABS 1.7 11/08/2021    Imaging:  No results found.  Medications: I have reviewed the patient's current medications.  Assessment/Plan: Colon cancer, stage IIa (T3 N0 M0), sigmoidectomy 04/29/2021 Colonoscopy 03/24/2021-partially obstructing mass in the sigmoid colon, biopsy-adenocarcinoma CTs 03/28/2021-sigmoid colon mass, prominent presacral and sigmoid mesentery lymph nodes-nonspecific, no evidence of distant metastatic disease, sclerotic lesions at the left intertrochanteric region-likely a bone island Sigmoidectomy 04/29/2021-sigmoid colon tumor, 0/16 lymph nodes, no lymphovascular or perineural invasion, no loss of mismatch repair protein expression, MSS Colonoscopy 04/27/2022-polyps removed from the ascending and transverse colon-tubular adenomas 05/31/2022 CEA 8.54 06/28/2022 CEA 9.48 07/10/2022 CTs-new lesion central left hepatic lobe 07/16/2022 MRI 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  Disposition: Joan Owen appears unchanged.  The recent MRI of the liver confirms a solitary liver mass.  Her case was presented at the GI tumor conference.  Recommendation is to refer to Dr. Fayrene Helper at [facility] to consider resection.  She  has an appointment with Dr. Fayrene Helper 07/24/2022.  She is having anxiety, some difficulty sleeping due to the above.  Prescription for Xanax 0.5 mg twice daily as needed sent to her pharmacy.  She understands she should not drive while taking this medication.  We scheduled a return visit in approximately 2 weeks.  Patient seen with Dr. Benay Spice.    Ned Card ANP/GNP-BC   07/20/2022  9:46 AM  This was a shared visit with Marcello Moores.  We reviewed the MRI findings with Joan Owen.  Her case was presented at the GI tumor conference on 07/19/2022.  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  The lesion appears resectable. She is scheduled to see Dr. Fayrene Helper early next week.  We discussed the likely recommendation for chemotherapy in the adjuvant setting.  She will return for an office visit and further discussion after the appointment with Dr. Fayrene Helper  I was present for greater than 50% today's visit.  I performed medical decision making.  Julieanne Manson, MD

## 2022-07-24 ENCOUNTER — Encounter: Payer: Self-pay | Admitting: Oncology

## 2022-07-24 DIAGNOSIS — R160 Hepatomegaly, not elsewhere classified: Secondary | ICD-10-CM | POA: Diagnosis not present

## 2022-07-24 DIAGNOSIS — C187 Malignant neoplasm of sigmoid colon: Secondary | ICD-10-CM | POA: Diagnosis not present

## 2022-07-25 ENCOUNTER — Other Ambulatory Visit: Payer: Self-pay | Admitting: *Deleted

## 2022-07-25 ENCOUNTER — Encounter: Payer: Self-pay | Admitting: *Deleted

## 2022-07-25 DIAGNOSIS — C187 Malignant neoplasm of sigmoid colon: Secondary | ICD-10-CM

## 2022-07-25 NOTE — Progress Notes (Signed)
PATIENT NAVIGATOR PROGRESS NOTE  Name: Joan Owen Date: 07/25/2022 MRN: [id number]  DOB: 1956/06/11   Reason for visit:  F/U from [facility] visit  Comments:  Called and spoke with pt regarding discussion with Dr Benay Spice and Dr Fayrene Helper to have neoadjuvant chemo. We discussed PAC placement and have arranged it for Monday, 07/31/22 with arrival time 0730 [facility]. Instructions given for NPO after midnight, must have driver and 24 hour supervision, can take morning meds with sips of water.  Verbalized understanding    Time spent counseling/coordinating care: 45-60 minutes

## 2022-07-31 ENCOUNTER — Ambulatory Visit (HOSPITAL_COMMUNITY)
Admission: RE | Admit: 2022-07-31 | Discharge: 2022-07-31 | Disposition: A | Discharge status: Home / Self Care | Payer: Medicare Other | Source: Ambulatory Visit | Attending: Oncology | Admitting: Oncology

## 2022-07-31 ENCOUNTER — Other Ambulatory Visit: Payer: Self-pay | Admitting: Radiology

## 2022-07-31 ENCOUNTER — Other Ambulatory Visit: Payer: Self-pay

## 2022-07-31 DIAGNOSIS — Z452 Encounter for adjustment and management of vascular access device: Secondary | ICD-10-CM | POA: Diagnosis not present

## 2022-07-31 DIAGNOSIS — C187 Malignant neoplasm of sigmoid colon: Secondary | ICD-10-CM | POA: Diagnosis not present

## 2022-07-31 DIAGNOSIS — C19 Malignant neoplasm of rectosigmoid junction: Secondary | ICD-10-CM | POA: Diagnosis not present

## 2022-07-31 DIAGNOSIS — Z800 Family history of malignant neoplasm of digestive organs: Secondary | ICD-10-CM | POA: Diagnosis not present

## 2022-07-31 DIAGNOSIS — C189 Malignant neoplasm of colon, unspecified: Secondary | ICD-10-CM | POA: Diagnosis not present

## 2022-07-31 HISTORY — PX: IR IMAGING GUIDED PORT INSERTION: IMG5740

## 2022-07-31 MED ORDER — MIDAZOLAM HCL 2 MG/2ML IJ SOLN
INTRAMUSCULAR | Status: AC | PRN
  Administered 2022-07-31: 1 mg via INTRAVENOUS

## 2022-07-31 MED ORDER — FENTANYL CITRATE (PF) 100 MCG/2ML IJ SOLN
INTRAMUSCULAR | Status: AC | PRN
  Administered 2022-07-31: 25 ug via INTRAVENOUS

## 2022-07-31 MED ORDER — LIDOCAINE-EPINEPHRINE 1 %-1:100000 IJ SOLN
INTRAMUSCULAR | Status: AC
  Administered 2022-07-31: 10 mL
  Filled 2022-07-31: qty 1

## 2022-07-31 MED ORDER — FENTANYL CITRATE (PF) 100 MCG/2ML IJ SOLN
INTRAMUSCULAR | Status: AC
  Filled 2022-07-31: qty 2

## 2022-07-31 MED ORDER — HEPARIN SOD (PORK) LOCK FLUSH 100 UNIT/ML IV SOLN
INTRAVENOUS | Status: AC
  Administered 2022-07-31: 500 [IU]
  Filled 2022-07-31: qty 5

## 2022-07-31 MED ORDER — MIDAZOLAM HCL 2 MG/2ML IJ SOLN
INTRAMUSCULAR | Status: AC
  Filled 2022-07-31: qty 2

## 2022-07-31 NOTE — H&P (Signed)
Chief Complaint: Patient was seen in consultation today for port-a-catheter placement.   Referring Physician(s): Ladell Pier  Supervising Physician: Michaelle Birks  Patient Status: [facility] - Out-pt  History of Present Illness: Joan Owen is a 66 y.o. female with a medical history significant for recently diagnosed colon cancer. She was referred to GI last year for evaluation of rectal bleeding. A colonoscopy performed 03/24/21 showed a distal sigmoid mass with pathology positive for adenocarcinoma. She underwent a sigmoidectomy 04/29/21 with Dr. Marcello Moores. Recent lab work showed a mildly elevated CEA level. She was sent for a imaging and this showed a new lesion in the central left hepatic lobe concerning for metastatic disease. Her oncology team is preparing her for adjuvant chemotherapy and she will require durable venous access.  Interventional Radiology has been asked to evaluate this patient for an image-guided port-a-catheter placement to facilitate her treatment plans.   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  Medications: Prior to Admission medications   Medication Sig Start Date End Date Taking? Authorizing Provider  ibuprofen (ADVIL) 200 MG tablet Take 400 mg by mouth every 6 (six) hours as needed for moderate pain.   Yes [provider]  Melatonin 5 MG CAPS Take 5 mg by mouth at bedtime as needed (sleep).   Yes [provider]  Multiple Vitamins-Minerals (MULTIVITAMIN WITH MINERALS) tablet Take 1 tablet by mouth daily.   Yes [provider]  ALPRAZolam (XANAX) 0.5 MG tablet Take 1 tablet (0.5 mg total) by mouth 2 (two) times daily as needed for anxiety. 07/20/22   Owens Shark, NP     Family History  Problem Relation Age of Onset   Dementia Mother        Alzheimer   Alzheimer's disease Mother    Heart disease Father    Cancer Sister        skin   Dementia Sister    Alzheimer's disease Sister    Cancer Brother        skin   Thyroid nodules Daughter    Colon cancer Neg Hx    Esophageal cancer Neg Hx    Rectal cancer Neg Hx    Stomach cancer Neg Hx    Colon polyps Neg Hx     Social History   Socioeconomic History   Marital status: Married    Spouse name: Not on file   Number of children: 3   Years of education: Not on file   Highest education level: Not on file  Occupational History   Occupation: Futures trader  Tobacco Use   Smoking status: Never   Smokeless tobacco: Never  Vaping Use   Vaping Use: Never used  Substance and Sexual Activity   Alcohol use: Not Currently    Alcohol/week: 2.0 standard drinks of alcohol    Types: 2 Glasses of wine  per week   Drug use: No   Sexual activity: Not on file  Other Topics Concern   Not on file  Social History Narrative   Not on file   Social Determinants of Health   Financial Resource Strain: Not on file  Food Insecurity: Not on file  Transportation Needs: Not on file  Physical Activity: Not on file  Stress: Not on file  Social Connections: Not on file    Review of Systems: A 12 point ROS discussed and pertinent positives are indicated in the HPI above.  All other systems are negative.  Review of Systems  Constitutional:  Negative for appetite change and fatigue.  Respiratory:  Negative for cough and shortness of breath.   Cardiovascular:  Negative for chest pain and leg swelling.  Gastrointestinal:  Negative  for abdominal pain, diarrhea, nausea and vomiting.  Musculoskeletal:  Negative for back pain.  Neurological:  Negative for dizziness and headaches.    Vital Signs: BP 129/70 (BP Location: Left Arm)   Pulse 63   Temp 98.2 F (36.8 C) (Oral)   Resp 18   Ht '5\' 4"'$  (1.626 m)   Wt 147 lb (66.7 kg)   LMP 03/28/2013   SpO2 100%   BMI 25.23 kg/m   Physical Exam Constitutional:      General: She is not in acute distress.    Appearance: She is not ill-appearing.  HENT:     Mouth/Throat:     Mouth: Mucous membranes are moist.     Pharynx: Oropharynx is clear.  Cardiovascular:     Rate and Rhythm: Normal rate and regular rhythm.     Pulses: Normal pulses.     Heart sounds: Normal heart sounds.  Pulmonary:     Effort: Pulmonary effort is normal.     Breath sounds: Normal breath sounds.  Abdominal:     General: Bowel sounds are normal.     Palpations: Abdomen is soft.     Tenderness: There is no abdominal tenderness.  Musculoskeletal:     Right lower leg: No edema.     Left lower leg: No edema.  Skin:    General: Skin is warm and dry.  Neurological:     Mental Status: She is alert and oriented to person, place, and time.     Imaging: MR LIVER W WO CONTRAST  Result Date: 07/16/2022 CLINICAL DATA:  Colon carcinoma. Elevated CEA. Liver lesion on recent CT. EXAM: MRI ABDOMEN WITHOUT AND WITH CONTRAST TECHNIQUE: Multiplanar multisequence MR imaging of the abdomen was performed both before and after the administration of intravenous contrast. CONTRAST:  63m GADAVIST GADOBUTROL 1 MMOL/ML IV SOLN COMPARISON:  CT on 07/10/2022 and 03/28/2021 FINDINGS: Lower chest: No acute findings. Hepatobiliary: A heterogeneously enhancing hypovascular mass is seen in segment 4A of the left hepatic lobe, measuring 4.3 x 3.7 cm on image 16/15. No other hepatic masses are identified, however this has features highly suspicious for liver metastasis. Prior cholecystectomy noted, with stable dilatation of the  extrahepatic common bile duct. Pancreas:  No mass or inflammatory changes. Spleen:  Within normal limits in size and appearance. Adrenals/Urinary Tract: No suspicious masses identified. No evidence of hydronephrosis. Stomach/Bowel: Unremarkable. Vascular/Lymphatic: No pathologically enlarged lymph nodes identified. No acute vascular findings. Other:  None. Musculoskeletal:  No suspicious bone lesions identified. IMPRESSION: Solitary 4.3 cm hypovascular mass in the left hepatic lobe, highly suspicious for liver metastasis. No other sites of metastatic disease identified within the abdomen. Electronically Signed   By: JJenny Reichmann  Tereso Newcomer M.D.   On: 07/16/2022 17:32   CT CHEST ABDOMEN PELVIS W CONTRAST  Result Date: 07/11/2022 CLINICAL DATA:  Colorectal carcinoma. Elevated Ca. Sigmoidectomy 04/29/2021. * Tracking Code: BO * EXAM: CT CHEST, ABDOMEN, AND PELVIS WITH CONTRAST TECHNIQUE: Multidetector CT imaging of the chest, abdomen and pelvis was performed following the standard protocol during bolus administration of intravenous contrast. RADIATION DOSE REDUCTION: This exam was performed according to the departmental dose-optimization program which includes automated exposure control, adjustment of the mA and/or kV according to patient size and/or use of iterative reconstruction technique. CONTRAST:  52m OMNIPAQUE IOHEXOL 300 MG/ML  SOLN COMPARISON:  03/28/2021 FINDINGS: CT CHEST FINDINGS Cardiovascular: No significant vascular findings. Normal heart size. No pericardial effusion. Mediastinum/Nodes: No axillary or supraclavicular adenopathy. No mediastinal or hilar adenopathy. No pericardial fluid. Esophagus normal. Lungs/Pleura: No suspicious pulmonary nodules. Normal pleural. Airways normal. Musculoskeletal: No aggressive osseous lesion. CT ABDOMEN AND PELVIS FINDINGS Hepatobiliary: Round hypoenhancing lesion in the central LEFT hepatic lobe measures 4.2 x 3.4 cm (49/series 2) and is new from comparison CT 03/28/2021.  Postcholecystectomy.  Mild biliary duct dilatation similar prior. Pancreas: Pancreas is normal. No ductal dilatation. No pancreatic inflammation. Spleen: Normal spleen Adrenals/urinary tract: Adrenal glands and kidneys are normal. The ureters and bladder normal. Stomach/Bowel: Stomach, small bowel, appendix, and cecum are normal. Bowel anastomosis in the mid sigmoid colon. No obstruction or nodularity. Diverticula of the LEFT colon. Rectum normal. No mesenteric lymphadenopathy. Vascular/Lymphatic: Abdominal aorta is normal caliber. There is no retroperitoneal or periportal lymphadenopathy. No pelvic lymphadenopathy. Reproductive: Uterus and adnexa unremarkable. Other: No free fluid. Musculoskeletal: No aggressive osseous lesion. IMPRESSION: Impression Chest Impression: No thoracic metastasis Abdomen / Pelvis Impression: 1. 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 2. Mid sigmoid colon anastomosis without evidence local recurrence. 3. No adenopathy in the abdomen pelvis. 4. No skeletal metastasis. These results will be called to the ordering clinician or representative by the Radiologist Assistant, and communication documented in the PACS or [employer] Electronically Signed   By: SSuzy BouchardM.D.   On: 07/11/2022 16:26    Labs:  CBC: Recent Labs    11/08/21 0803  WBC 3.6*  HGB 13.2  HCT 39.2  PLT 215.0    COAGS: No results for input(s): "INR", "APTT" in the last 8760 hours.  BMP: Recent Labs    11/08/21 0803 06/28/22 0851  NA 139 144  K 4.1 4.4  CL 103 105  CO2 30 28  GLUCOSE 88 98  BUN 18 18  CALCIUM 9.4 9.7  CREATININE 0.87 0.89  GFRNONAA  --  >60    LIVER FUNCTION TESTS: Recent Labs    11/08/21 0803  BILITOT 0.9  AST 16  ALT 14  ALKPHOS 55  PROT 6.7  ALBUMIN 4.3    TUMOR MARKERS: Recent Labs    11/30/21 0905 05/31/22 0847 06/28/22 0851  CEA 2.83 8.54* 9.48*    Assessment and  Plan:  Colon cancer with suspected liver metastases; durable venous access required for chemotherapy: HNigel Sloop 66year old female[facility]-guided port-a-catheter placement.  Risks and benefits of image-guided port-a-catheter placement were discussed with the patient including, but not limited to bleeding, infection, pneumothorax, or fibrin sheath development and need for additional procedures.  All of the patient's questions were answered, patient is agreeable to proceed. She has been NPO.   Consent signed and in chart.  Thank you for this interesting consult.  I greatly enjoyed meeting Amybeth Sieg and look forward to participating in their care.  A copy of this report was sent to the requesting provider on this date.  Electronically Signed: Soyla Dryer, AGACNP-BC [phone] 07/31/2022, 9:23 AM   I spent a total of  30 Minutes   in face to face in clinical consultation, greater than 50% of which was counseling/coordinating care for port-a-catheter placement

## 2022-07-31 NOTE — Procedures (Signed)
Vascular and Interventional Radiology Procedure Note  Patient: Joan Owen DOB: [birth year] Medical Record Number: [id number] Note Date/Time: 07/31/22 9:37 AM   Performing Physician: Michaelle Birks, MD Assistant(s): None  Diagnosis: Colon cancer  Procedure: PORT PLACEMENT  Anesthesia: Conscious Sedation Complications: None Estimated Blood Loss: Minimal  Findings:  Successful right-sided port placement, with the tip of the catheter in the proximal right atrium.  Plan: Catheter ready for use.  See detailed procedure note with images in PACS. The patient tolerated the procedure well without incident or complication and was returned to Recovery in stable condition.    Michaelle Birks, MD [facility]   Pager. [place]

## 2022-08-04 ENCOUNTER — Other Ambulatory Visit: Payer: Self-pay | Admitting: *Deleted

## 2022-08-04 ENCOUNTER — Inpatient Hospital Stay: Payer: Medicare Other | Admitting: Oncology

## 2022-08-04 ENCOUNTER — Inpatient Hospital Stay: Payer: Medicare Other

## 2022-08-04 ENCOUNTER — Other Ambulatory Visit (HOSPITAL_BASED_OUTPATIENT_CLINIC_OR_DEPARTMENT_OTHER): Payer: Self-pay

## 2022-08-04 ENCOUNTER — Other Ambulatory Visit: Payer: Medicare Other

## 2022-08-04 VITALS — BP 135/77 | HR 90 | Temp 98.1°F | Resp 18 | Ht 64.0 in | Wt 146.2 lb

## 2022-08-04 DIAGNOSIS — Z85038 Personal history of other malignant neoplasm of large intestine: Secondary | ICD-10-CM | POA: Diagnosis not present

## 2022-08-04 DIAGNOSIS — Z5111 Encounter for antineoplastic chemotherapy: Secondary | ICD-10-CM | POA: Diagnosis not present

## 2022-08-04 DIAGNOSIS — F419 Anxiety disorder, unspecified: Secondary | ICD-10-CM | POA: Diagnosis not present

## 2022-08-04 DIAGNOSIS — K869 Disease of pancreas, unspecified: Secondary | ICD-10-CM | POA: Diagnosis not present

## 2022-08-04 DIAGNOSIS — C787 Secondary malignant neoplasm of liver and intrahepatic bile duct: Secondary | ICD-10-CM | POA: Diagnosis not present

## 2022-08-04 DIAGNOSIS — C189 Malignant neoplasm of colon, unspecified: Secondary | ICD-10-CM | POA: Diagnosis not present

## 2022-08-04 DIAGNOSIS — Z452 Encounter for adjustment and management of vascular access device: Secondary | ICD-10-CM | POA: Diagnosis not present

## 2022-08-04 MED ORDER — ONDANSETRON HCL 8 MG PO TABS
8.0000 mg | ORAL_TABLET | Freq: Three times a day (TID) | ORAL | 0 refills | Status: DC | PRN
  Filled 2022-08-04: qty 20, 7d supply, fill #0

## 2022-08-04 MED ORDER — PROCHLORPERAZINE MALEATE 10 MG PO TABS
10.0000 mg | ORAL_TABLET | Freq: Four times a day (QID) | ORAL | 0 refills | Status: DC | PRN
  Filled 2022-08-04: qty 30, 8d supply, fill #0

## 2022-08-04 NOTE — Progress Notes (Signed)
START ON PATHWAY REGIMEN - Colorectal     A cycle is every 14 days:     Oxaliplatin      Leucovorin      Fluorouracil      Fluorouracil   **Always confirm dose/schedule in your pharmacy ordering system**  Patient Characteristics: Distant Metastases, Resectable, Neoadjuvant Therapy Planned Tumor Location: Colon Therapeutic Status: Distant Metastases  Intent of Therapy: Curative Intent, Discussed with Patient 

## 2022-08-04 NOTE — Progress Notes (Signed)
  [place] OFFICE PROGRESS NOTE   Diagnosis: Colon cancer  INTERVAL HISTORY:   Joan Owen turns as scheduled.  She saw Dr. Fayrene Helper 07/03/2022.  He recommends chemotherapy prior to resection of the liver lesion.  She underwent Port-A-Cath placement 07/31/2022.  No new complaint.  Objective:  Vital signs in last 24 hours:  Blood pressure 135/77, pulse 90, temperature 98.1 F (36.7 C), temperature source Oral, resp. rate 18, height _0  (1.626 m), weight 146 lb 3.2 oz (66.3 kg), last menstrual period 03/28/2013, SpO2 100 %.    Resp: Lungs clear bilaterally Cardio: Regular rate and rhythm GI: No hepatosplenomegaly Vascular: No leg edema    Portacath/PICC-without erythema, resolving ecchymosis  Lab Results:  Lab Results  Component Value Date   WBC 3.6 (L) 11/08/2021   HGB 13.2 11/08/2021   HCT 39.2 11/08/2021   MCV 92.6 11/08/2021   PLT 215.0 11/08/2021   NEUTROABS 1.7 11/08/2021    CMP  Lab Results  Component Value Date   NA 144 06/28/2022   K 4.4 06/28/2022   CL 105 06/28/2022   CO2 28 06/28/2022   GLUCOSE 98 06/28/2022   BUN 18 06/28/2022   CREATININE 0.89 06/28/2022   CALCIUM 9.7 06/28/2022   PROT 6.7 11/08/2021   ALBUMIN 4.3 11/08/2021   AST 16 11/08/2021   ALT 14 11/08/2021   ALKPHOS 55 11/08/2021   BILITOT 0.9 11/08/2021   GFRNONAA >60 06/28/2022    Lab Results  Component Value Date   CEA 9.48 (H) 06/28/2022     Medications: I have reviewed the patient's current medications.   Assessment/Plan: Colon cancer, stage IIa (T3 N0 M0), sigmoidectomy 04/29/2021 Colonoscopy 03/24/2021-partially obstructing mass in the sigmoid colon, biopsy-adenocarcinoma CTs 03/28/2021-sigmoid colon mass, prominent presacral and sigmoid mesentery lymph nodes-nonspecific, no evidence of distant metastatic disease, sclerotic lesions at the left intertrochanteric region-likely a bone island Sigmoidectomy 04/29/2021-sigmoid colon tumor, 0/16 lymph nodes, no  lymphovascular or perineural invasion, no loss of mismatch repair protein expression, MSS Colonoscopy 04/27/2022-polyps removed from the ascending and transverse colon-tubular adenomas 05/31/2022 CEA 8.54 06/28/2022 CEA 9.48 07/10/2022 CTs-new lesion central left hepatic lobe 07/16/2022 MRI 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    Disposition: Joan Owen has been diagnosed with recurrent colon cancer involving a solitary liver metastasis.  The clinical presentation is consistent with a diagnosis of metastatic colon cancer as opposed to primary liver tumor or metastasis from another site.  Dr. Fayrene Helper recommends a 70-monthcourse of chemotherapy prior to surgery.  I recommend FOLFOX.  We reviewed potential toxicities associated with the FOLFOX regimen including the chance of nausea/vomiting, mucositis, diarrhea, alopecia, hematologic toxicity, infection, and bleeding.  We discussed the cardiac toxicity, rash, hyperpigmentation, sun sensitivity, and hand/foot syndrome associated with 5-fluorouracil.  We reviewed the allergic reaction and rash types of neuropathy associated with oxaliplatin.  She agrees to proceed.  She attended a chemotherapy teach class today.  The plan is to begin FOLFOX on 08/10/2022.  A chemotherapy plan was entered today.  GBetsy Coder MD  08/04/2022  1:02 PM

## 2022-08-06 ENCOUNTER — Other Ambulatory Visit: Payer: Self-pay

## 2022-08-08 ENCOUNTER — Telehealth: Payer: Self-pay | Admitting: *Deleted

## 2022-08-08 ENCOUNTER — Encounter: Payer: Self-pay | Admitting: *Deleted

## 2022-08-08 ENCOUNTER — Encounter: Payer: Self-pay | Admitting: Oncology

## 2022-08-08 ENCOUNTER — Inpatient Hospital Stay: Payer: Medicare Other | Admitting: Licensed Clinical Social Worker

## 2022-08-08 DIAGNOSIS — Z85038 Personal history of other malignant neoplasm of large intestine: Secondary | ICD-10-CM

## 2022-08-08 DIAGNOSIS — C187 Malignant neoplasm of sigmoid colon: Secondary | ICD-10-CM

## 2022-08-08 NOTE — Progress Notes (Signed)
PATIENT NAVIGATOR PROGRESS NOTE  Name: Joan Owen Date: 08/08/2022 MRN: [id number]  DOB: Mar 22, 1956   Reason for visit:  First treatment scheduled  Comments:  Spoke with Ms Scherger and reviewed treatment day tomorrow Noticed some bright red blood this weekend when having bowel movement, stated that she was straining and a bit constipated. Discussed mild stool softner  2. FMLA and disability paperwork emailed to Patient forms team Referral to Social work entered    Time spent counseling/coordinating care: 30-45 minutes

## 2022-08-08 NOTE — Telephone Encounter (Addendum)
Called patient to remind her to stop taking her MVI while on chemotherapy and explained how to take the zofran and compazine. Email to [facility]ology requesting [facility] testing on case # [id number] dated 04/29/21. 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

## 2022-08-08 NOTE — Progress Notes (Signed)
Advance Work  Clinical Social Work was referred by Art therapist for assessment of psychosocial needs.  Clinical Social Worker contacted patient by phone  to offer support and assess for needs.    CSW provided information regarding role.  She stated she currently had no needs.  Her family is very supportive.  Her friends are also helping her with transportation.  She is an Glass blower/designer at [employer].  Offered assistance when navigating her options for disability in the future and she agreed.  CSW to [employer] and [employer] and Wellness literature, as well as, CSW contact information.     Margaree Mackintosh, LCSW  Clinical Social Worker [facility]

## 2022-08-09 ENCOUNTER — Other Ambulatory Visit: Payer: Self-pay | Admitting: Oncology

## 2022-08-09 ENCOUNTER — Other Ambulatory Visit: Payer: Self-pay | Admitting: Nurse Practitioner

## 2022-08-09 ENCOUNTER — Inpatient Hospital Stay: Payer: Medicare Other

## 2022-08-09 VITALS — BP 112/60 | HR 70 | Temp 98.5°F | Resp 18 | Ht 64.0 in | Wt 145.4 lb

## 2022-08-09 DIAGNOSIS — K869 Disease of pancreas, unspecified: Secondary | ICD-10-CM | POA: Diagnosis not present

## 2022-08-09 DIAGNOSIS — C44300 Unspecified malignant neoplasm of skin of unspecified part of face: Secondary | ICD-10-CM | POA: Diagnosis not present

## 2022-08-09 DIAGNOSIS — C787 Secondary malignant neoplasm of liver and intrahepatic bile duct: Secondary | ICD-10-CM | POA: Diagnosis not present

## 2022-08-09 DIAGNOSIS — Z85038 Personal history of other malignant neoplasm of large intestine: Secondary | ICD-10-CM

## 2022-08-09 DIAGNOSIS — Z452 Encounter for adjustment and management of vascular access device: Secondary | ICD-10-CM | POA: Diagnosis not present

## 2022-08-09 DIAGNOSIS — C189 Malignant neoplasm of colon, unspecified: Secondary | ICD-10-CM | POA: Diagnosis not present

## 2022-08-09 DIAGNOSIS — F419 Anxiety disorder, unspecified: Secondary | ICD-10-CM | POA: Diagnosis not present

## 2022-08-09 DIAGNOSIS — Z5111 Encounter for antineoplastic chemotherapy: Secondary | ICD-10-CM | POA: Diagnosis not present

## 2022-08-09 LAB — CMP (CANCER CENTER ONLY)
ALT: 20 U/L (ref 0–44)
AST: 19 U/L (ref 15–41)
Albumin: 4.5 g/dL (ref 3.5–5.0)
Alkaline Phosphatase: 53 U/L (ref 38–126)
Anion gap: 8 (ref 5–15)
BUN: 20 mg/dL (ref 8–23)
CO2: 28 mmol/L (ref 22–32)
Calcium: 9.8 mg/dL (ref 8.9–10.3)
Chloride: 103 mmol/L (ref 98–111)
Creatinine: 0.78 mg/dL (ref 0.44–1.00)
GFR, Estimated: >60 mL/min (ref >60)
Glucose, Bld: 83 mg/dL (ref 70–99)
Potassium: 4.1 mmol/L (ref 3.5–5.1)
Sodium: 139 mmol/L (ref 135–145)
Total Bilirubin: 1.1 mg/dL (ref 0.3–1.2)
Total Protein: 7.1 g/dL (ref 6.5–8.1)

## 2022-08-09 LAB — CBC WITH DIFFERENTIAL (CANCER CENTER ONLY)
Abs Immature Granulocytes: 0.01 10*3/uL (ref 0.00–0.07)
Basophils Absolute: 0 10*3/uL (ref 0.0–0.1)
Basophils Relative: 1 %
Eosinophils Absolute: 0.1 10*3/uL (ref 0.0–0.5)
Eosinophils Relative: 1 %
HCT: 38 % (ref 36.0–46.0)
Hemoglobin: 12.6 g/dL (ref 12.0–15.0)
Immature Granulocytes: 0 %
Lymphocytes Relative: 24 %
Lymphs Abs: 1 10*3/uL (ref 0.7–4.0)
MCH: 30.1 pg (ref 26.0–34.0)
MCHC: 33.2 g/dL (ref 30.0–36.0)
MCV: 90.9 fL (ref 80.0–100.0)
Monocytes Absolute: 0.3 10*3/uL (ref 0.1–1.0)
Monocytes Relative: 7 %
Neutro Abs: 2.7 10*3/uL (ref 1.7–7.7)
Neutrophils Relative %: 67 %
Platelet Count: 222 10*3/uL (ref 150–400)
RBC: 4.18 MIL/uL (ref 3.87–5.11)
RDW: 12.4 % (ref 11.5–15.5)
WBC Count: 4 10*3/uL (ref 4.0–10.5)
nRBC: 0 % (ref 0.0–0.2)

## 2022-08-09 LAB — CEA (ACCESS): CEA (CHCC): 12.27 ng/mL — ABNORMAL HIGH (ref 0.00–5.00)

## 2022-08-09 MED ORDER — LEUCOVORIN CALCIUM INJECTION 350 MG
400.0000 mg/m2 | Freq: Once | INTRAVENOUS | Status: AC
  Administered 2022-08-09: 692 mg via INTRAVENOUS
  Filled 2022-08-09: qty 34.6

## 2022-08-09 MED ORDER — SODIUM CHLORIDE 0.9 % IV SOLN
10.0000 mg | Freq: Once | INTRAVENOUS | Status: AC
  Administered 2022-08-09: 10 mg via INTRAVENOUS
  Filled 2022-08-09: qty 1

## 2022-08-09 MED ORDER — SODIUM CHLORIDE 0.9 % IV SOLN
2400.0000 mg/m2 | INTRAVENOUS | Status: DC
  Administered 2022-08-09: 4150 mg via INTRAVENOUS
  Filled 2022-08-09: qty 83

## 2022-08-09 MED ORDER — FLUOROURACIL CHEMO INJECTION 2.5 GM/50ML
400.0000 mg/m2 | Freq: Once | INTRAVENOUS | Status: AC
  Administered 2022-08-09: 700 mg via INTRAVENOUS
  Filled 2022-08-09: qty 14

## 2022-08-09 MED ORDER — DEXTROSE 5 % IV SOLN: Freq: Once | INTRAVENOUS | Status: AC

## 2022-08-09 MED ORDER — PALONOSETRON HCL INJECTION 0.25 MG/5ML
0.2500 mg | Freq: Once | INTRAVENOUS | Status: AC
  Administered 2022-08-09: 0.25 mg via INTRAVENOUS
  Filled 2022-08-09: qty 5

## 2022-08-09 MED ORDER — OXALIPLATIN CHEMO INJECTION 100 MG/20ML
85.0000 mg/m2 | Freq: Once | INTRAVENOUS | Status: AC
  Administered 2022-08-09: 145 mg via INTRAVENOUS
  Filled 2022-08-09: qty 10

## 2022-08-09 NOTE — Patient Instructions (Signed)
[facility]   Discharge Instructions: Thank you for choosing [facility] to provide your oncology and hematology care.   If you have a lab appointment with the [facility], please go directly to the [facility] and check in at the registration area.   Wear comfortable clothing and clothing appropriate for easy access to any Portacath or PICC line.   We strive to give you quality time with your provider. You may need to reschedule your appointment if you arrive late (15 or more minutes).  Arriving late affects you and other patients whose appointments are after yours.  Also, if you miss three or more appointments without notifying the office, you may be dismissed from the clinic at the provider's discretion.      For prescription refill requests, have your pharmacy contact our office and allow 72 hours for refills to be completed.    Today you received the following chemotherapy and/or immunotherapy agents Oxaliplatin (ELOXATIN), Leucovorin & Flourouracil (ADRUCIL).      To help prevent nausea and vomiting after your treatment, we encourage you to take your nausea medication as directed.  BELOW ARE SYMPTOMS THAT SHOULD BE REPORTED IMMEDIATELY: *FEVER GREATER THAN 100.4 F (38 C) OR HIGHER *CHILLS OR SWEATING *NAUSEA AND VOMITING THAT IS NOT CONTROLLED WITH YOUR NAUSEA MEDICATION *UNUSUAL SHORTNESS OF BREATH *UNUSUAL BRUISING OR BLEEDING *URINARY PROBLEMS (pain or burning when urinating, or frequent urination) *BOWEL PROBLEMS (unusual diarrhea, constipation, pain near the anus) TENDERNESS IN MOUTH AND THROAT WITH OR WITHOUT PRESENCE OF ULCERS (sore throat, sores in mouth, or a toothache) UNUSUAL RASH, SWELLING OR PAIN  UNUSUAL VAGINAL DISCHARGE OR ITCHING   Items with * indicate a potential emergency and should be followed up as soon as possible or go to the Emergency Department if any problems should occur.  Please show the CHEMOTHERAPY ALERT  CARD or IMMUNOTHERAPY ALERT CARD at check-in to the Emergency Department and triage nurse.  Should you have questions after your visit or need to cancel or reschedule your appointment, please contact [facility]  Dept: [phone]  and follow the prompts.  Office hours are 8:00 a.m. to 4:30 p.m. Monday - Friday. Please note that voicemails left after 4:00 p.m. may not be returned until the following business day.  We are closed weekends and major holidays. You have access to a nurse at all times for urgent questions. Please call the main number to the clinic Dept: [phone] and follow the prompts.   For any non-urgent questions, you may also contact your provider using MyChart. We now offer e-Visits for anyone 89 and older to request care online for non-urgent symptoms. For details visit [email or web address].   Also download the MyChart app! Go to the app store, search "MyChart", open the app, select [facility], and log in with your MyChart username and password.  Oxaliplatin Injection What is this medication? OXALIPLATIN (ox AL i PLA tin) treats some types of cancer. It works by slowing down the growth of cancer cells. This medicine may be used for other purposes; ask your health care provider or pharmacist if you have questions. COMMON BRAND NAME(S): Eloxatin What should I tell my care team before I take this medication? They need to know if you have any of these conditions: Heart disease History of irregular heartbeat or rhythm Liver disease Low blood cell levels (white cells, red cells, and platelets) Lung or breathing disease, such as asthma Take medications that treat  or prevent blood clots Tingling of the fingers, toes, or other nerve disorder An unusual or allergic reaction to oxaliplatin, other medications, foods, dyes, or preservatives If you or your partner are pregnant or trying to get pregnant Breast-feeding How should I use this  medication? This medication is injected into a vein. It is given by your care team in a hospital or clinic setting. Talk to your care team about the use of this medication in children. Special care may be needed. Overdosage: If you think you have taken too much of this medicine contact a poison control center or emergency room at once. NOTE: This medicine is only for you. Do not share this medicine with others. What if I miss a dose? Keep appointments for follow-up doses. It is important not to miss a dose. Call your care team if you are unable to keep an appointment. What may interact with this medication? Do not take this medication with any of the following: Cisapride Dronedarone Pimozide Thioridazine This medication may also interact with the following: Aspirin and aspirin-like medications Certain medications that treat or prevent blood clots, such as warfarin, apixaban, dabigatran, and rivaroxaban Cisplatin Cyclosporine Diuretics Medications for infection, such as acyclovir, adefovir, amphotericin B, bacitracin, cidofovir, foscarnet, ganciclovir, gentamicin, pentamidine, vancomycin NSAIDs, medications for pain and inflammation, such as ibuprofen or naproxen Other medications that cause heart rhythm changes Pamidronate Zoledronic acid This list may not describe all possible interactions. Give your health care provider a list of all the medicines, herbs, non-prescription drugs, or dietary supplements you use. Also tell them if you smoke, drink alcohol, or use illegal drugs. Some items may interact with your medicine. What should I watch for while using this medication? Your condition will be monitored carefully while you are receiving this medication. You may need blood work while taking this medication. This medication may make you feel generally unwell. This is not uncommon as chemotherapy can affect healthy cells as well as cancer cells. Report any side effects. Continue your course  of treatment even though you feel ill unless your care team tells you to stop. This medication may increase your risk of getting an infection. Call your care team for advice if you get a fever, chills, sore throat, or other symptoms of a cold or flu. Do not treat yourself. Try to avoid being around people who are sick. Avoid taking medications that contain aspirin, acetaminophen, ibuprofen, naproxen, or ketoprofen unless instructed by your care team. These medications may hide a fever. Be careful brushing or flossing your teeth or using a toothpick because you may get an infection or bleed more easily. If you have any dental work done, tell your dentist you are receiving this medication. This medication can make you more sensitive to cold. Do not drink cold drinks or use ice. Cover exposed skin before coming in contact with cold temperatures or cold objects. When out in cold weather wear warm clothing and cover your mouth and nose to warm the air that goes into your lungs. Tell your care team if you get sensitive to the cold. Talk to your care team if you or your partner are pregnant or think either of you might be pregnant. This medication can cause serious birth defects if taken during pregnancy and for 9 months after the last dose. A negative pregnancy test is required before starting this medication. A reliable form of contraception is recommended while taking this medication and for 9 months after the last dose. Talk to your care  team about effective forms of contraception. Do not father a child while taking this medication and for 6 months after the last dose. Use a condom while having sex during this time period. Do not breastfeed while taking this medication and for 3 months after the last dose. This medication may cause infertility. Talk to your care team if you are concerned about your fertility. What side effects may I notice from receiving this medication? Side effects that you should report to  your care team as soon as possible: Allergic reactions--skin rash, itching, hives, swelling of the face, lips, tongue, or throat Bleeding--bloody or black, tar-like stools, vomiting blood or brown material that looks like coffee grounds, red or dark brown urine, small red or purple spots on skin, unusual bruising or bleeding Dry cough, shortness of breath or trouble breathing Heart rhythm changes--fast or irregular heartbeat, dizziness, feeling faint or lightheaded, chest pain, trouble breathing Infection--fever, chills, cough, sore throat, wounds that don't heal, pain or trouble when passing urine, general feeling of discomfort or being unwell Liver injury--right upper belly pain, loss of appetite, nausea, light-colored stool, dark yellow or brown urine, yellowing skin or eyes, unusual weakness or fatigue Low red blood cell level--unusual weakness or fatigue, dizziness, headache, trouble breathing Muscle injury--unusual weakness or fatigue, muscle pain, dark yellow or brown urine, decrease in amount of urine Pain, tingling, or numbness in the hands or feet Sudden and severe headache, confusion, change in vision, seizures, which may be signs of posterior reversible encephalopathy syndrome (PRES) Unusual bruising or bleeding Side effects that usually do not require medical attention (report to your care team if they continue or are bothersome): Diarrhea Nausea Pain, redness, or swelling with sores inside the mouth or throat Unusual weakness or fatigue Vomiting This list may not describe all possible side effects. Call your doctor for medical advice about side effects. You may report side effects to FDA at 1-800-FDA-1088. Where should I keep my medication? This medication is given in a hospital or clinic. It will not be stored at home. NOTE: This sheet is a summary. It may not cover all possible information. If you have questions about this medicine, talk to your doctor, pharmacist, or health care  provider.  2023 Elsevier/Gold Standard (2007-09-21 00:00:00) Leucovorin Injection What is this medication? LEUCOVORIN (loo koe VOR in) prevents side effects from certain medications, such as methotrexate. It works by increasing folate levels. This helps protect healthy cells in your body. It may also be used to treat anemia caused by low levels of folate. It can also be used with fluorouracil, a type of chemotherapy, to treat colorectal cancer. It works by increasing the effects of fluorouracil in the body. This medicine may be used for other purposes; ask your health care provider or pharmacist if you have questions. What should I tell my care team before I take this medication? They need to know if you have any of these conditions: Anemia from low levels of vitamin B12 in the blood An unusual or allergic reaction to leucovorin, folic acid, other medications, foods, dyes, or preservatives Pregnant or trying to get pregnant Breastfeeding How should I use this medication? This medication is injected into a vein or a muscle. It is given by your care team in a hospital or clinic setting. Talk to your care team about the use of this medication in children. Special care may be needed. Overdosage: If you think you have taken too much of this medicine contact a poison control center or  emergency room at once. NOTE: This medicine is only for you. Do not share this medicine with others. What if I miss a dose? Keep appointments for follow-up doses. It is important not to miss your dose. Call your care team if you are unable to keep an appointment. What may interact with this medication? Capecitabine Fluorouracil Phenobarbital Phenytoin Primidone Trimethoprim;sulfamethoxazole This list may not describe all possible interactions. Give your health care provider a list of all the medicines, herbs, non-prescription drugs, or dietary supplements you use. Also tell them if you smoke, drink alcohol, or use  illegal drugs. Some items may interact with your medicine. What should I watch for while using this medication? Your condition will be monitored carefully while you are receiving this medication. This medication may increase the side effects of 5-fluorouracil. Tell your care team if you have diarrhea or mouth sores that do not get better or that get worse. What side effects may I notice from receiving this medication? Side effects that you should report to your care team as soon as possible: Allergic reactions--skin rash, itching, hives, swelling of the face, lips, tongue, or throat This list may not describe all possible side effects. Call your doctor for medical advice about side effects. You may report side effects to FDA at 1-800-FDA-1088. Where should I keep my medication? This medication is given in a hospital or clinic. It will not be stored at home. NOTE: This sheet is a summary. It may not cover all possible information. If you have questions about this medicine, talk to your doctor, pharmacist, or health care provider.  2023 Elsevier/Gold Standard (2022-01-03 00:00:00)  Fluorouracil Injection What is this medication? FLUOROURACIL (flure oh YOOR a sil) treats some types of cancer. It works by slowing down the growth of cancer cells. This medicine may be used for other purposes; ask your health care provider or pharmacist if you have questions. COMMON BRAND NAME(S): Adrucil What should I tell my care team before I take this medication? They need to know if you have any of these conditions: Blood disorders Dihydropyrimidine dehydrogenase (DPD) deficiency Infection, such as chickenpox, cold sores, herpes Kidney disease Liver disease Poor nutrition Recent or ongoing radiation therapy An unusual or allergic reaction to fluorouracil, other medications, foods, dyes, or preservatives If you or your partner are pregnant or trying to get pregnant Breast-feeding How should I use this  medication? This medication is injected into a vein. It is administered by your care team in a hospital or clinic setting. Talk to your care team about the use of this medication in children. Special care may be needed. Overdosage: If you think you have taken too much of this medicine contact a poison control center or emergency room at once. NOTE: This medicine is only for you. Do not share this medicine with others. What if I miss a dose? Keep appointments for follow-up doses. It is important not to miss your dose. Call your care team if you are unable to keep an appointment. What may interact with this medication? Do not take this medication with any of the following: Live virus vaccines This medication may also interact with the following: Medications that treat or prevent blood clots, such as warfarin, enoxaparin, dalteparin This list may not describe all possible interactions. Give your health care provider a list of all the medicines, herbs, non-prescription drugs, or dietary supplements you use. Also tell them if you smoke, drink alcohol, or use illegal drugs. Some items may interact with your medicine.  What should I watch for while using this medication? Your condition will be monitored carefully while you are receiving this medication. This medication may make you feel generally unwell. This is not uncommon as chemotherapy can affect healthy cells as well as cancer cells. Report any side effects. Continue your course of treatment even though you feel ill unless your care team tells you to stop. In some cases, you may be given additional medications to help with side effects. Follow all directions for their use. This medication may increase your risk of getting an infection. Call your care team for advice if you get a fever, chills, sore throat, or other symptoms of a cold or flu. Do not treat yourself. Try to avoid being around people who are sick. This medication may increase your risk  to bruise or bleed. Call your care team if you notice any unusual bleeding. Be careful brushing or flossing your teeth or using a toothpick because you may get an infection or bleed more easily. If you have any dental work done, tell your dentist you are receiving this medication. Avoid taking medications that contain aspirin, acetaminophen, ibuprofen, naproxen, or ketoprofen unless instructed by your care team. These medications may hide a fever. Do not treat diarrhea with over the counter products. Contact your care team if you have diarrhea that lasts more than 2 days or if it is severe and watery. This medication can make you more sensitive to the sun. Keep out of the sun. If you cannot avoid being in the sun, wear protective clothing and sunscreen. Do not use sun lamps, tanning beds, or tanning booths. Talk to your care team if you or your partner wish to become pregnant or think you might be pregnant. This medication can cause serious birth defects if taken during pregnancy and for 3 months after the last dose. A reliable form of contraception is recommended while taking this medication and for 3 months after the last dose. Talk to your care team about effective forms of contraception. Do not father a child while taking this medication and for 3 months after the last dose. Use a condom while having sex during this time period. Do not breastfeed while taking this medication. This medication may cause infertility. Talk to your care team if you are concerned about your fertility. What side effects may I notice from receiving this medication? Side effects that you should report to your care team as soon as possible: Allergic reactions--skin rash, itching, hives, swelling of the face, lips, tongue, or throat Heart attack--pain or tightness in the chest, shoulders, arms, or jaw, nausea, shortness of breath, cold or clammy skin, feeling faint or lightheaded Heart failure--shortness of breath, swelling of  the ankles, feet, or hands, sudden weight gain, unusual weakness or fatigue Heart rhythm changes--fast or irregular heartbeat, dizziness, feeling faint or lightheaded, chest pain, trouble breathing High ammonia level--unusual weakness or fatigue, confusion, loss of appetite, nausea, vomiting, seizures Infection--fever, chills, cough, sore throat, wounds that don't heal, pain or trouble when passing urine, general feeling of discomfort or being unwell Low red blood cell level--unusual weakness or fatigue, dizziness, headache, trouble breathing Pain, tingling, or numbness in the hands or feet, muscle weakness, change in vision, confusion or trouble speaking, loss of balance or coordination, trouble walking, seizures Redness, swelling, and blistering of the skin over hands and feet Severe or prolonged diarrhea Unusual bruising or bleeding Side effects that usually do not require medical attention (report to your care team if they  continue or are bothersome): Dry skin Headache Increased tears Nausea Pain, redness, or swelling with sores inside the mouth or throat Sensitivity to light Vomiting This list may not describe all possible side effects. Call your doctor for medical advice about side effects. You may report side effects to FDA at 1-800-FDA-1088. Where should I keep my medication? This medication is given in a hospital or clinic. It will not be stored at home. NOTE: This sheet is a summary. It may not cover all possible information. If you have questions about this medicine, talk to your doctor, pharmacist, or health care provider.  2023 Elsevier/Gold Standard (2021-11-29 00:00:00)  The chemotherapy medication bag should finish at 46 hours, 96 hours, or 7 days. For example, if your pump is scheduled for 46 hours and it was put on at 4:00 p.m., it should finish at 2:00 p.m. the day it is scheduled to come off regardless of your appointment time.     Estimated time to finish at 12:00 p.m.  on Friday 08/11/2022.   If the display on your pump reads "Low Volume" and it is beeping, take the batteries out of the pump and come to the cancer center for it to be taken off.   If the pump alarms go off prior to the pump reading "Low Volume" then call [phone] and someone can assist you.  If the plunger comes out and the chemotherapy medication is leaking out, please use your home chemo spill kit to clean up the spill. Do NOT use paper towels or other household products.  If you have problems or questions regarding your pump, please call either [phone] (24 hours a day) or the cancer center Monday-Friday 8:00 a.m.- 4:30 p.m. at the clinic number and we will assist you. If you are unable to get assistance, then go to the nearest Emergency Department and ask the staff to contact the IV team for assistance.

## 2022-08-09 NOTE — Progress Notes (Signed)
Pharmacist Chemotherapy Monitoring - Initial Assessment    Anticipated start date: 08/09/22   The following has been reviewed per standard work regarding the patient's treatment regimen: The patient's diagnosis, treatment plan and drug doses, and organ/hematologic function Lab orders and baseline tests specific to treatment regimen  The treatment plan start date, drug sequencing, and pre-medications Prior authorization status  Patient's documented medication list, including drug-drug interaction screen and prescriptions for anti-emetics and supportive care specific to the treatment regimen The drug concentrations, fluid compatibility, administration routes, and timing of the medications to be used The patient's access for treatment and lifetime cumulative dose history, if applicable  The patient's medication allergies and previous infusion related reactions, if applicable   Changes made to treatment plan:  N/A  Follow up needed:  N/A   Patrica Duel, RPH, 08/09/2022  10:31 AM

## 2022-08-10 ENCOUNTER — Telehealth: Payer: Self-pay

## 2022-08-10 NOTE — Telephone Encounter (Signed)
24 Hour Call Back  Telephone call to patient post her First Time Folfox infusion. No response, left a voice message. Patient is scheduled back for her pump stop tomorrow 08/11/2022.

## 2022-08-11 ENCOUNTER — Inpatient Hospital Stay: Payer: Medicare Other

## 2022-08-11 VITALS — BP 132/72 | HR 63 | Temp 98.5°F | Resp 20

## 2022-08-11 DIAGNOSIS — Z85038 Personal history of other malignant neoplasm of large intestine: Secondary | ICD-10-CM

## 2022-08-11 DIAGNOSIS — Z5111 Encounter for antineoplastic chemotherapy: Secondary | ICD-10-CM | POA: Diagnosis not present

## 2022-08-11 MED ORDER — HEPARIN SOD (PORK) LOCK FLUSH 100 UNIT/ML IV SOLN
500.0000 [IU] | Freq: Once | INTRAVENOUS | Status: AC | PRN
  Administered 2022-08-11: 500 [IU]

## 2022-08-11 MED ORDER — SODIUM CHLORIDE 0.9% FLUSH
10.0000 mL | INTRAVENOUS | Status: DC | PRN
  Administered 2022-08-11: 10 mL

## 2022-08-11 NOTE — Patient Instructions (Signed)

## 2022-08-14 ENCOUNTER — Encounter: Payer: Self-pay | Admitting: Oncology

## 2022-08-18 ENCOUNTER — Encounter: Payer: Self-pay | Admitting: *Deleted

## 2022-08-18 ENCOUNTER — Other Ambulatory Visit: Payer: Self-pay | Admitting: *Deleted

## 2022-08-18 DIAGNOSIS — Z95828 Presence of other vascular implants and grafts: Secondary | ICD-10-CM | POA: Insufficient documentation

## 2022-08-18 MED ORDER — LIDOCAINE-PRILOCAINE 2.5-2.5 % EX CREA: 1.0000 | TOPICAL_CREAM | CUTANEOUS | 0 refills | Status: DC | PRN

## 2022-08-20 ENCOUNTER — Other Ambulatory Visit: Payer: Self-pay | Admitting: Oncology

## 2022-08-20 DIAGNOSIS — Z85038 Personal history of other malignant neoplasm of large intestine: Secondary | ICD-10-CM

## 2022-08-21 ENCOUNTER — Encounter (HOSPITAL_COMMUNITY): Payer: Self-pay | Admitting: Oncology

## 2022-08-21 ENCOUNTER — Encounter (HOSPITAL_COMMUNITY): Payer: Self-pay

## 2022-08-21 DIAGNOSIS — C189 Malignant neoplasm of colon, unspecified: Secondary | ICD-10-CM | POA: Diagnosis not present

## 2022-08-23 ENCOUNTER — Inpatient Hospital Stay: Payer: Medicare Other | Attending: Oncology

## 2022-08-23 ENCOUNTER — Inpatient Hospital Stay: Payer: Medicare Other

## 2022-08-23 ENCOUNTER — Encounter: Payer: Self-pay | Admitting: *Deleted

## 2022-08-23 ENCOUNTER — Inpatient Hospital Stay: Payer: Medicare Other | Admitting: Oncology

## 2022-08-23 ENCOUNTER — Encounter: Payer: Self-pay | Admitting: Oncology

## 2022-08-23 VITALS — BP 117/63 | HR 64

## 2022-08-23 VITALS — BP 116/71 | HR 66 | Temp 98.1°F | Resp 20 | Ht 64.0 in | Wt 145.6 lb

## 2022-08-23 DIAGNOSIS — Z85038 Personal history of other malignant neoplasm of large intestine: Secondary | ICD-10-CM

## 2022-08-23 DIAGNOSIS — Z5111 Encounter for antineoplastic chemotherapy: Secondary | ICD-10-CM | POA: Insufficient documentation

## 2022-08-23 DIAGNOSIS — Z452 Encounter for adjustment and management of vascular access device: Secondary | ICD-10-CM | POA: Diagnosis not present

## 2022-08-23 DIAGNOSIS — C187 Malignant neoplasm of sigmoid colon: Secondary | ICD-10-CM | POA: Insufficient documentation

## 2022-08-23 LAB — CBC WITH DIFFERENTIAL (CANCER CENTER ONLY)
Abs Immature Granulocytes: 0.04 10*3/uL (ref 0.00–0.07)
Basophils Absolute: 0.1 10*3/uL (ref 0.0–0.1)
Basophils Relative: 1 %
Eosinophils Absolute: 0.1 10*3/uL (ref 0.0–0.5)
Eosinophils Relative: 1 %
HCT: 35.7 % — ABNORMAL LOW (ref 36.0–46.0)
Hemoglobin: 12 g/dL (ref 12.0–15.0)
Immature Granulocytes: 1 %
Lymphocytes Relative: 23 %
Lymphs Abs: 1.1 10*3/uL (ref 0.7–4.0)
MCH: 30.7 pg (ref 26.0–34.0)
MCHC: 33.6 g/dL (ref 30.0–36.0)
MCV: 91.3 fL (ref 80.0–100.0)
Monocytes Absolute: 0.4 10*3/uL (ref 0.1–1.0)
Monocytes Relative: 8 %
Neutro Abs: 3.2 10*3/uL (ref 1.7–7.7)
Neutrophils Relative %: 66 %
Platelet Count: 177 10*3/uL (ref 150–400)
RBC: 3.91 MIL/uL (ref 3.87–5.11)
RDW: 12.5 % (ref 11.5–15.5)
WBC Count: 4.8 10*3/uL (ref 4.0–10.5)
nRBC: 0 % (ref 0.0–0.2)

## 2022-08-23 LAB — CMP (CANCER CENTER ONLY)
ALT: 18 U/L (ref 0–44)
AST: 18 U/L (ref 15–41)
Albumin: 4.3 g/dL (ref 3.5–5.0)
Alkaline Phosphatase: 50 U/L (ref 38–126)
Anion gap: 9 (ref 5–15)
BUN: 22 mg/dL (ref 8–23)
CO2: 27 mmol/L (ref 22–32)
Calcium: 9.3 mg/dL (ref 8.9–10.3)
Chloride: 103 mmol/L (ref 98–111)
Creatinine: 0.71 mg/dL (ref 0.44–1.00)
GFR, Estimated: >60 mL/min (ref >60)
Glucose, Bld: 94 mg/dL (ref 70–99)
Potassium: 4 mmol/L (ref 3.5–5.1)
Sodium: 139 mmol/L (ref 135–145)
Total Bilirubin: 1.2 mg/dL (ref 0.3–1.2)
Total Protein: 6.8 g/dL (ref 6.5–8.1)

## 2022-08-23 MED ORDER — FLUOROURACIL CHEMO INJECTION 2.5 GM/50ML
400.0000 mg/m2 | Freq: Once | INTRAVENOUS | Status: AC
  Administered 2022-08-23: 700 mg via INTRAVENOUS
  Filled 2022-08-23: qty 14

## 2022-08-23 MED ORDER — OXALIPLATIN CHEMO INJECTION 100 MG/20ML
85.0000 mg/m2 | Freq: Once | INTRAVENOUS | Status: AC
  Administered 2022-08-23: 145 mg via INTRAVENOUS
  Filled 2022-08-23: qty 20

## 2022-08-23 MED ORDER — PALONOSETRON HCL INJECTION 0.25 MG/5ML
0.2500 mg | Freq: Once | INTRAVENOUS | Status: AC
  Administered 2022-08-23: 0.25 mg via INTRAVENOUS
  Filled 2022-08-23: qty 5

## 2022-08-23 MED ORDER — DEXTROSE 5 % IV SOLN: Freq: Once | INTRAVENOUS | Status: AC

## 2022-08-23 MED ORDER — LEUCOVORIN CALCIUM INJECTION 350 MG
400.0000 mg/m2 | Freq: Once | INTRAVENOUS | Status: AC
  Administered 2022-08-23: 692 mg via INTRAVENOUS
  Filled 2022-08-23: qty 34.6

## 2022-08-23 MED ORDER — SODIUM CHLORIDE 0.9 % IV SOLN
2400.0000 mg/m2 | INTRAVENOUS | Status: DC
  Administered 2022-08-23: 4150 mg via INTRAVENOUS
  Filled 2022-08-23: qty 83

## 2022-08-23 MED ORDER — SODIUM CHLORIDE 0.9 % IV SOLN
10.0000 mg | Freq: Once | INTRAVENOUS | Status: AC
  Administered 2022-08-23: 10 mg via INTRAVENOUS
  Filled 2022-08-23: qty 1

## 2022-08-23 NOTE — Patient Instructions (Addendum)
[place]   The chemotherapy medication bag should finish at 46 hours, 96 hours, or 7 days. For example, if your pump is scheduled for 46 hours and it was put on at 4:00 p.m., it should finish at 2:00 p.m. the day it is scheduled to come off regardless of your appointment time.     Estimated time to finish at 1:30 Friday, August 25, 2022   If the display on your pump reads "Low Volume" and it is beeping, take the batteries out of the pump and come to the cancer center for it to be taken off.   If the pump alarms go off prior to the pump reading "Low Volume" then call [phone] and someone can assist you.  If the plunger comes out and the chemotherapy medication is leaking out, please use your home chemo spill kit to clean up the spill. Do NOT use paper towels or other household products.  If you have problems or questions regarding your pump, please call either [phone] (24 hours a day) or the cancer center Monday-Friday 8:00 a.m.- 4:30 p.m. at the clinic number and we will assist you. If you are unable to get assistance, then go to the nearest Emergency Department and ask the staff to contact the IV team for assistance.  Discharge Instructions: Thank you for choosing [facility] to provide your oncology and hematology care.   If you have a lab appointment with the [facility], please go directly to the [facility] and check in at the registration area.   Wear comfortable clothing and clothing appropriate for easy access to any Portacath or PICC line.   We strive to give you quality time with your provider. You may need to reschedule your appointment if you arrive late (15 or more minutes).  Arriving late affects you and other patients whose appointments are after yours.  Also, if you miss three or more appointments without notifying the office, you may be dismissed from the clinic at the provider's discretion.      For prescription  refill requests, have your pharmacy contact our office and allow 72 hours for refills to be completed.    Today you received the following chemotherapy and/or immunotherapy agents Oxaliplatin, Leucovorin, Fluorouracil.      To help prevent nausea and vomiting after your treatment, we encourage you to take your nausea medication as directed.  BELOW ARE SYMPTOMS THAT SHOULD BE REPORTED IMMEDIATELY: *FEVER GREATER THAN 100.4 F (38 C) OR HIGHER *CHILLS OR SWEATING *NAUSEA AND VOMITING THAT IS NOT CONTROLLED WITH YOUR NAUSEA MEDICATION *UNUSUAL SHORTNESS OF BREATH *UNUSUAL BRUISING OR BLEEDING *URINARY PROBLEMS (pain or burning when urinating, or frequent urination) *BOWEL PROBLEMS (unusual diarrhea, constipation, pain near the anus) TENDERNESS IN MOUTH AND THROAT WITH OR WITHOUT PRESENCE OF ULCERS (sore throat, sores in mouth, or a toothache) UNUSUAL RASH, SWELLING OR PAIN  UNUSUAL VAGINAL DISCHARGE OR ITCHING   Items with * indicate a potential emergency and should be followed up as soon as possible or go to the Emergency Department if any problems should occur.  Please show the CHEMOTHERAPY ALERT CARD or IMMUNOTHERAPY ALERT CARD at check-in to the Emergency Department and triage nurse.  Should you have questions after your visit or need to cancel or reschedule your appointment, please contact [facility]  Dept: [phone]  and follow the prompts.  Office hours are 8:00 a.m. to 4:30 p.m. Monday - Friday. Please note that voicemails  left after 4:00 p.m. may not be returned until the following business day.  We are closed weekends and major holidays. You have access to a nurse at all times for urgent questions. Please call the main number to the clinic Dept: [phone] and follow the prompts.   For any non-urgent questions, you may also contact your provider using MyChart. We now offer e-Visits for anyone 70 and older to request care online for non-urgent  symptoms. For details visit [email or web address].   Also download the MyChart app! Go to the app store, search "MyChart", open the app, select [facility], and log in with your MyChart username and password.  Oxaliplatin Injection What is this medication? OXALIPLATIN (ox AL i PLA tin) treats some types of cancer. It works by slowing down the growth of cancer cells. This medicine may be used for other purposes; ask your health care provider or pharmacist if you have questions. COMMON BRAND NAME(S): Eloxatin What should I tell my care team before I take this medication? They need to know if you have any of these conditions: Heart disease History of irregular heartbeat or rhythm Liver disease Low blood cell levels (white cells, red cells, and platelets) Lung or breathing disease, such as asthma Take medications that treat or prevent blood clots Tingling of the fingers, toes, or other nerve disorder An unusual or allergic reaction to oxaliplatin, other medications, foods, dyes, or preservatives If you or your partner are pregnant or trying to get pregnant Breast-feeding How should I use this medication? This medication is injected into a vein. It is given by your care team in a hospital or clinic setting. Talk to your care team about the use of this medication in children. Special care may be needed. Overdosage: If you think you have taken too much of this medicine contact a poison control center or emergency room at once. NOTE: This medicine is only for you. Do not share this medicine with others. What if I miss a dose? Keep appointments for follow-up doses. It is important not to miss a dose. Call your care team if you are unable to keep an appointment. What may interact with this medication? Do not take this medication with any of the following: Cisapride Dronedarone Pimozide Thioridazine This medication may also interact with the following: Aspirin and aspirin-like  medications Certain medications that treat or prevent blood clots, such as warfarin, apixaban, dabigatran, and rivaroxaban Cisplatin Cyclosporine Diuretics Medications for infection, such as acyclovir, adefovir, amphotericin B, bacitracin, cidofovir, foscarnet, ganciclovir, gentamicin, pentamidine, vancomycin NSAIDs, medications for pain and inflammation, such as ibuprofen or naproxen Other medications that cause heart rhythm changes Pamidronate Zoledronic acid This list may not describe all possible interactions. Give your health care provider a list of all the medicines, herbs, non-prescription drugs, or dietary supplements you use. Also tell them if you smoke, drink alcohol, or use illegal drugs. Some items may interact with your medicine. What should I watch for while using this medication? Your condition will be monitored carefully while you are receiving this medication. You may need blood work while taking this medication. This medication may make you feel generally unwell. This is not uncommon as chemotherapy can affect healthy cells as well as cancer cells. Report any side effects. Continue your course of treatment even though you feel ill unless your care team tells you to stop. This medication may increase your risk of getting an infection. Call your care team for advice if you get a fever, chills, sore throat, or  other symptoms of a cold or flu. Do not treat yourself. Try to avoid being around people who are sick. Avoid taking medications that contain aspirin, acetaminophen, ibuprofen, naproxen, or ketoprofen unless instructed by your care team. These medications may hide a fever. Be careful brushing or flossing your teeth or using a toothpick because you may get an infection or bleed more easily. If you have any dental work done, tell your dentist you are receiving this medication. This medication can make you more sensitive to cold. Do not drink cold drinks or use ice. Cover exposed  skin before coming in contact with cold temperatures or cold objects. When out in cold weather wear warm clothing and cover your mouth and nose to warm the air that goes into your lungs. Tell your care team if you get sensitive to the cold. Talk to your care team if you or your partner are pregnant or think either of you might be pregnant. This medication can cause serious birth defects if taken during pregnancy and for 9 months after the last dose. A negative pregnancy test is required before starting this medication. A reliable form of contraception is recommended while taking this medication and for 9 months after the last dose. Talk to your care team about effective forms of contraception. Do not father a child while taking this medication and for 6 months after the last dose. Use a condom while having sex during this time period. Do not breastfeed while taking this medication and for 3 months after the last dose. This medication may cause infertility. Talk to your care team if you are concerned about your fertility. What side effects may I notice from receiving this medication? Side effects that you should report to your care team as soon as possible: Allergic reactions--skin rash, itching, hives, swelling of the face, lips, tongue, or throat Bleeding--bloody or black, tar-like stools, vomiting blood or brown material that looks like coffee grounds, red or dark brown urine, small red or purple spots on skin, unusual bruising or bleeding Dry cough, shortness of breath or trouble breathing Heart rhythm changes--fast or irregular heartbeat, dizziness, feeling faint or lightheaded, chest pain, trouble breathing Infection--fever, chills, cough, sore throat, wounds that don't heal, pain or trouble when passing urine, general feeling of discomfort or being unwell Liver injury--right upper belly pain, loss of appetite, nausea, light-colored stool, dark yellow or brown urine, yellowing skin or eyes, unusual  weakness or fatigue Low red blood cell level--unusual weakness or fatigue, dizziness, headache, trouble breathing Muscle injury--unusual weakness or fatigue, muscle pain, dark yellow or brown urine, decrease in amount of urine Pain, tingling, or numbness in the hands or feet Sudden and severe headache, confusion, change in vision, seizures, which may be signs of posterior reversible encephalopathy syndrome (PRES) Unusual bruising or bleeding Side effects that usually do not require medical attention (report to your care team if they continue or are bothersome): Diarrhea Nausea Pain, redness, or swelling with sores inside the mouth or throat Unusual weakness or fatigue Vomiting This list may not describe all possible side effects. Call your doctor for medical advice about side effects. You may report side effects to FDA at 1-800-FDA-1088. Where should I keep my medication? This medication is given in a hospital or clinic. It will not be stored at home. NOTE: This sheet is a summary. It may not cover all possible information. If you have questions about this medicine, talk to your doctor, pharmacist, or health care provider.  2023 Elsevier/Gold Standard (  2007-09-21 00:00:00)  Leucovorin Injection What is this medication? LEUCOVORIN (loo koe VOR in) prevents side effects from certain medications, such as methotrexate. It works by increasing folate levels. This helps protect healthy cells in your body. It may also be used to treat anemia caused by low levels of folate. It can also be used with fluorouracil, a type of chemotherapy, to treat colorectal cancer. It works by increasing the effects of fluorouracil in the body. This medicine may be used for other purposes; ask your health care provider or pharmacist if you have questions. What should I tell my care team before I take this medication? They need to know if you have any of these conditions: Anemia from low levels of vitamin B12 in the  blood An unusual or allergic reaction to leucovorin, folic acid, other medications, foods, dyes, or preservatives Pregnant or trying to get pregnant Breastfeeding How should I use this medication? This medication is injected into a vein or a muscle. It is given by your care team in a hospital or clinic setting. Talk to your care team about the use of this medication in children. Special care may be needed. Overdosage: If you think you have taken too much of this medicine contact a poison control center or emergency room at once. NOTE: This medicine is only for you. Do not share this medicine with others. What if I miss a dose? Keep appointments for follow-up doses. It is important not to miss your dose. Call your care team if you are unable to keep an appointment. What may interact with this medication? Capecitabine Fluorouracil Phenobarbital Phenytoin Primidone Trimethoprim;sulfamethoxazole This list may not describe all possible interactions. Give your health care provider a list of all the medicines, herbs, non-prescription drugs, or dietary supplements you use. Also tell them if you smoke, drink alcohol, or use illegal drugs. Some items may interact with your medicine. What should I watch for while using this medication? Your condition will be monitored carefully while you are receiving this medication. This medication may increase the side effects of 5-fluorouracil. Tell your care team if you have diarrhea or mouth sores that do not get better or that get worse. What side effects may I notice from receiving this medication? Side effects that you should report to your care team as soon as possible: Allergic reactions--skin rash, itching, hives, swelling of the face, lips, tongue, or throat This list may not describe all possible side effects. Call your doctor for medical advice about side effects. You may report side effects to FDA at 1-800-FDA-1088. Where should I keep my  medication? This medication is given in a hospital or clinic. It will not be stored at home. NOTE: This sheet is a summary. It may not cover all possible information. If you have questions about this medicine, talk to your doctor, pharmacist, or health care provider.  2023 Elsevier/Gold Standard (2022-01-03 00:00:00)  Fluorouracil Injection What is this medication? FLUOROURACIL (flure oh YOOR a sil) treats some types of cancer. It works by slowing down the growth of cancer cells. This medicine may be used for other purposes; ask your health care provider or pharmacist if you have questions. COMMON BRAND NAME(S): Adrucil What should I tell my care team before I take this medication? They need to know if you have any of these conditions: Blood disorders Dihydropyrimidine dehydrogenase (DPD) deficiency Infection, such as chickenpox, cold sores, herpes Kidney disease Liver disease Poor nutrition Recent or ongoing radiation therapy An unusual or allergic reaction to  fluorouracil, other medications, foods, dyes, or preservatives If you or your partner are pregnant or trying to get pregnant Breast-feeding How should I use this medication? This medication is injected into a vein. It is administered by your care team in a hospital or clinic setting. Talk to your care team about the use of this medication in children. Special care may be needed. Overdosage: If you think you have taken too much of this medicine contact a poison control center or emergency room at once. NOTE: This medicine is only for you. Do not share this medicine with others. What if I miss a dose? Keep appointments for follow-up doses. It is important not to miss your dose. Call your care team if you are unable to keep an appointment. What may interact with this medication? Do not take this medication with any of the following: Live virus vaccines This medication may also interact with the following: Medications that treat  or prevent blood clots, such as warfarin, enoxaparin, dalteparin This list may not describe all possible interactions. Give your health care provider a list of all the medicines, herbs, non-prescription drugs, or dietary supplements you use. Also tell them if you smoke, drink alcohol, or use illegal drugs. Some items may interact with your medicine. What should I watch for while using this medication? Your condition will be monitored carefully while you are receiving this medication. This medication may make you feel generally unwell. This is not uncommon as chemotherapy can affect healthy cells as well as cancer cells. Report any side effects. Continue your course of treatment even though you feel ill unless your care team tells you to stop. In some cases, you may be given additional medications to help with side effects. Follow all directions for their use. This medication may increase your risk of getting an infection. Call your care team for advice if you get a fever, chills, sore throat, or other symptoms of a cold or flu. Do not treat yourself. Try to avoid being around people who are sick. This medication may increase your risk to bruise or bleed. Call your care team if you notice any unusual bleeding. Be careful brushing or flossing your teeth or using a toothpick because you may get an infection or bleed more easily. If you have any dental work done, tell your dentist you are receiving this medication. Avoid taking medications that contain aspirin, acetaminophen, ibuprofen, naproxen, or ketoprofen unless instructed by your care team. These medications may hide a fever. Do not treat diarrhea with over the counter products. Contact your care team if you have diarrhea that lasts more than 2 days or if it is severe and watery. This medication can make you more sensitive to the sun. Keep out of the sun. If you cannot avoid being in the sun, wear protective clothing and sunscreen. Do not use sun lamps,  tanning beds, or tanning booths. Talk to your care team if you or your partner wish to become pregnant or think you might be pregnant. This medication can cause serious birth defects if taken during pregnancy and for 3 months after the last dose. A reliable form of contraception is recommended while taking this medication and for 3 months after the last dose. Talk to your care team about effective forms of contraception. Do not father a child while taking this medication and for 3 months after the last dose. Use a condom while having sex during this time period. Do not breastfeed while taking this medication. This medication  may cause infertility. Talk to your care team if you are concerned about your fertility. What side effects may I notice from receiving this medication? Side effects that you should report to your care team as soon as possible: Allergic reactions--skin rash, itching, hives, swelling of the face, lips, tongue, or throat Heart attack--pain or tightness in the chest, shoulders, arms, or jaw, nausea, shortness of breath, cold or clammy skin, feeling faint or lightheaded Heart failure--shortness of breath, swelling of the ankles, feet, or hands, sudden weight gain, unusual weakness or fatigue Heart rhythm changes--fast or irregular heartbeat, dizziness, feeling faint or lightheaded, chest pain, trouble breathing High ammonia level--unusual weakness or fatigue, confusion, loss of appetite, nausea, vomiting, seizures Infection--fever, chills, cough, sore throat, wounds that don't heal, pain or trouble when passing urine, general feeling of discomfort or being unwell Low red blood cell level--unusual weakness or fatigue, dizziness, headache, trouble breathing Pain, tingling, or numbness in the hands or feet, muscle weakness, change in vision, confusion or trouble speaking, loss of balance or coordination, trouble walking, seizures Redness, swelling, and blistering of the skin over hands and  feet Severe or prolonged diarrhea Unusual bruising or bleeding Side effects that usually do not require medical attention (report to your care team if they continue or are bothersome): Dry skin Headache Increased tears Nausea Pain, redness, or swelling with sores inside the mouth or throat Sensitivity to light Vomiting This list may not describe all possible side effects. Call your doctor for medical advice about side effects. You may report side effects to FDA at 1-800-FDA-1088. Where should I keep my medication? This medication is given in a hospital or clinic. It will not be stored at home. NOTE: This sheet is a summary. It may not cover all possible information. If you have questions about this medicine, talk to your doctor, pharmacist, or health care provider.  2023 Elsevier/Gold Standard (2021-11-29 00:00:00)

## 2022-08-23 NOTE — Progress Notes (Signed)
Patient seen by Dr. Benay Spice today  Vitals are within treatment parameters.  Labs reviewed by Dr. Benay Spice CBC diff reviewed and within treatment parameters, CMP pending.Per MD Benay Spice, wait for CMP results prior to proceeding with tx.   Per physician team, patient is ready for treatment and there are NO modifications to the treatment plan.

## 2022-08-23 NOTE — Progress Notes (Signed)
  [facility] OFFICE PROGRESS NOTE   Diagnosis: Colon cancer  INTERVAL HISTORY:   Joan Owen completed cycle 1 FOLFOX on 08/09/2022.  She reports tolerating the chemotherapy well.  No nausea/vomiting, mouth sores, diarrhea, or neuropathy symptoms.  She has intermittent "hemorrhoid "bleeding when wiping.  No other complaint.  Objective:  Vital signs in last 24 hours:  Blood pressure 116/71, pulse 66, temperature 98.1 F (36.7 C), temperature source Oral, resp. rate 20, height '5\' 4"'$  (1.626 m), weight 145 lb 9.6 oz (66 kg), last menstrual period 03/28/2013, SpO2 100 %.    HEENT: No thrush or ulcers Resp: Lungs clear bilaterally Cardio: Regular rate and rhythm GI: No hepatosplenomegaly Vascular: No leg edema    Portacath/PICC-without erythema  Lab Results:  Lab Results  Component Value Date   WBC 4.8 08/23/2022   HGB 12.0 08/23/2022   HCT 35.7 (L) 08/23/2022   MCV 91.3 08/23/2022   PLT 177 08/23/2022   NEUTROABS 3.2 08/23/2022    CMP  Lab Results  Component Value Date   NA 139 08/09/2022   K 4.1 08/09/2022   CL 103 08/09/2022   CO2 28 08/09/2022   GLUCOSE 83 08/09/2022   BUN 20 08/09/2022   CREATININE 0.78 08/09/2022   CALCIUM 9.8 08/09/2022   PROT 7.1 08/09/2022   ALBUMIN 4.5 08/09/2022   AST 19 08/09/2022   ALT 20 08/09/2022   ALKPHOS 53 08/09/2022   BILITOT 1.1 08/09/2022   GFRNONAA >60 08/09/2022    Lab Results  Component Value Date   CEA 12.27 (H) 08/09/2022     Medications: I have reviewed the patient's current medications.   Assessment/Plan: Colon cancer, stage IIa (T3 N0 M0), sigmoidectomy 04/29/2021 Colonoscopy 03/24/2021-partially obstructing mass in the sigmoid colon, biopsy-adenocarcinoma CTs 03/28/2021-sigmoid colon mass, prominent presacral and sigmoid mesentery lymph nodes-nonspecific, no evidence of distant metastatic disease, sclerotic lesions at the left intertrochanteric region-likely a bone island Sigmoidectomy  04/29/2021-sigmoid colon tumor, 0/16 lymph nodes, no lymphovascular or perineural invasion, no loss of mismatch repair protein expression, MSS Foundation 1-MSS, tumor mutation burden 6, K-ras and NRAS wild-type,ERBB2 amplification-equivocal Colonoscopy 04/27/2022-polyps removed from the ascending and transverse colon-tubular adenomas 05/31/2022 CEA 8.54 06/28/2022 CEA 9.48 07/10/2022 CTs-new lesion central left hepatic lobe 07/16/2022 MRI 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 Cycle 1 FOLFOX 08/09/2022 Cycle 2 FOLFOX 08/23/2022      Disposition: Joan Owen elevated the first cycle of FOLFOX well.  She will complete cycle 2 today.  She will return for an office visit and cycle 3 FOLFOX in 2 weeks.  I reviewed results from the Foundation 1 testing with her.  We will request immunohistochemistry staining for her-2 amplification. She will call for persistent rectal bleeding. Betsy Coder, MD  08/23/2022  11:21 AM

## 2022-08-23 NOTE — Progress Notes (Signed)
Ordered IHC and Her2 testing on accession number [id number]

## 2022-08-25 ENCOUNTER — Inpatient Hospital Stay: Payer: Medicare Other

## 2022-08-25 VITALS — BP 121/67 | HR 75 | Temp 97.8°F | Resp 18

## 2022-08-25 DIAGNOSIS — Z5111 Encounter for antineoplastic chemotherapy: Secondary | ICD-10-CM | POA: Diagnosis not present

## 2022-08-25 DIAGNOSIS — Z85038 Personal history of other malignant neoplasm of large intestine: Secondary | ICD-10-CM

## 2022-08-25 DIAGNOSIS — Z452 Encounter for adjustment and management of vascular access device: Secondary | ICD-10-CM | POA: Diagnosis not present

## 2022-08-25 DIAGNOSIS — C187 Malignant neoplasm of sigmoid colon: Secondary | ICD-10-CM | POA: Diagnosis not present

## 2022-08-25 MED ORDER — SODIUM CHLORIDE 0.9% FLUSH
10.0000 mL | INTRAVENOUS | Status: DC | PRN
  Administered 2022-08-25: 10 mL

## 2022-08-25 MED ORDER — HEPARIN SOD (PORK) LOCK FLUSH 100 UNIT/ML IV SOLN
500.0000 [IU] | Freq: Once | INTRAVENOUS | Status: AC | PRN
  Administered 2022-08-25: 500 [IU]

## 2022-08-25 NOTE — Patient Instructions (Signed)

## 2022-08-29 ENCOUNTER — Telehealth: Payer: Self-pay | Admitting: *Deleted

## 2022-08-30 ENCOUNTER — Encounter: Payer: Self-pay | Admitting: Oncology

## 2022-09-03 ENCOUNTER — Other Ambulatory Visit: Payer: Self-pay | Admitting: Oncology

## 2022-09-06 ENCOUNTER — Inpatient Hospital Stay: Payer: Medicare Other

## 2022-09-06 ENCOUNTER — Encounter: Payer: Self-pay | Admitting: Nurse Practitioner

## 2022-09-06 ENCOUNTER — Inpatient Hospital Stay: Payer: Medicare Other | Admitting: Nurse Practitioner

## 2022-09-06 VITALS — BP 118/82 | HR 78 | Temp 97.9°F | Resp 18 | Ht 64.0 in | Wt 146.2 lb

## 2022-09-06 DIAGNOSIS — Z452 Encounter for adjustment and management of vascular access device: Secondary | ICD-10-CM | POA: Diagnosis not present

## 2022-09-06 DIAGNOSIS — Z85038 Personal history of other malignant neoplasm of large intestine: Secondary | ICD-10-CM

## 2022-09-06 DIAGNOSIS — Z5111 Encounter for antineoplastic chemotherapy: Secondary | ICD-10-CM | POA: Diagnosis not present

## 2022-09-06 DIAGNOSIS — C187 Malignant neoplasm of sigmoid colon: Secondary | ICD-10-CM | POA: Diagnosis not present

## 2022-09-06 LAB — CBC WITH DIFFERENTIAL (CANCER CENTER ONLY)
Abs Immature Granulocytes: 0.01 10*3/uL (ref 0.00–0.07)
Basophils Absolute: 0 10*3/uL (ref 0.0–0.1)
Basophils Relative: 1 %
Eosinophils Absolute: 0.1 10*3/uL (ref 0.0–0.5)
Eosinophils Relative: 3 %
HCT: 36.4 % (ref 36.0–46.0)
Hemoglobin: 12.4 g/dL (ref 12.0–15.0)
Immature Granulocytes: 0 %
Lymphocytes Relative: 31 %
Lymphs Abs: 1.1 10*3/uL (ref 0.7–4.0)
MCH: 30.9 pg (ref 26.0–34.0)
MCHC: 34.1 g/dL (ref 30.0–36.0)
MCV: 90.8 fL (ref 80.0–100.0)
Monocytes Absolute: 0.4 10*3/uL (ref 0.1–1.0)
Monocytes Relative: 10 %
Neutro Abs: 2 10*3/uL (ref 1.7–7.7)
Neutrophils Relative %: 55 %
Platelet Count: 138 10*3/uL — ABNORMAL LOW (ref 150–400)
RBC: 4.01 MIL/uL (ref 3.87–5.11)
RDW: 13.2 % (ref 11.5–15.5)
WBC Count: 3.6 10*3/uL — ABNORMAL LOW (ref 4.0–10.5)
nRBC: 0 % (ref 0.0–0.2)

## 2022-09-06 LAB — CMP (CANCER CENTER ONLY)
ALT: 16 U/L (ref 0–44)
AST: 13 U/L — ABNORMAL LOW (ref 15–41)
Albumin: 4 g/dL (ref 3.5–5.0)
Alkaline Phosphatase: 51 U/L (ref 38–126)
Anion gap: 9 (ref 5–15)
BUN: 19 mg/dL (ref 8–23)
CO2: 27 mmol/L (ref 22–32)
Calcium: 9.6 mg/dL (ref 8.9–10.3)
Chloride: 104 mmol/L (ref 98–111)
Creatinine: 0.72 mg/dL (ref 0.44–1.00)
GFR, Estimated: >60 mL/min (ref >60)
Glucose, Bld: 100 mg/dL — ABNORMAL HIGH (ref 70–99)
Potassium: 4 mmol/L (ref 3.5–5.1)
Sodium: 140 mmol/L (ref 135–145)
Total Bilirubin: 0.7 mg/dL (ref 0.3–1.2)
Total Protein: 6.7 g/dL (ref 6.5–8.1)

## 2022-09-06 LAB — CEA (ACCESS): CEA (CHCC): 17.68 ng/mL — ABNORMAL HIGH (ref 0.00–5.00)

## 2022-09-06 MED ORDER — SODIUM CHLORIDE 0.9 % IV SOLN
2400.0000 mg/m2 | INTRAVENOUS | Status: DC
  Administered 2022-09-06: 4150 mg via INTRAVENOUS
  Filled 2022-09-06: qty 83

## 2022-09-06 MED ORDER — PALONOSETRON HCL INJECTION 0.25 MG/5ML
0.2500 mg | Freq: Once | INTRAVENOUS | Status: AC
  Administered 2022-09-06: 0.25 mg via INTRAVENOUS
  Filled 2022-09-06: qty 5

## 2022-09-06 MED ORDER — SODIUM CHLORIDE 0.9 % IV SOLN
10.0000 mg | Freq: Once | INTRAVENOUS | Status: AC
  Administered 2022-09-06: 10 mg via INTRAVENOUS
  Filled 2022-09-06: qty 10

## 2022-09-06 MED ORDER — DEXTROSE 5 % IV SOLN: Freq: Once | INTRAVENOUS | Status: AC

## 2022-09-06 MED ORDER — LEUCOVORIN CALCIUM INJECTION 350 MG
400.0000 mg/m2 | Freq: Once | INTRAVENOUS | Status: AC
  Administered 2022-09-06: 692 mg via INTRAVENOUS
  Filled 2022-09-06: qty 34.6

## 2022-09-06 MED ORDER — FLUOROURACIL CHEMO INJECTION 2.5 GM/50ML
400.0000 mg/m2 | Freq: Once | INTRAVENOUS | Status: AC
  Administered 2022-09-06: 700 mg via INTRAVENOUS
  Filled 2022-09-06: qty 14

## 2022-09-06 MED ORDER — OXALIPLATIN CHEMO INJECTION 100 MG/20ML
85.0000 mg/m2 | Freq: Once | INTRAVENOUS | Status: AC
  Administered 2022-09-06: 145 mg via INTRAVENOUS
  Filled 2022-09-06: qty 20

## 2022-09-06 NOTE — Patient Instructions (Addendum)
[facility]   The chemotherapy medication bag should finish at 46 hours, 96 hours, or 7 days. For example, if your pump is scheduled for 46 hours and it was put on at 4:00 p.m., it should finish at 2:00 p.m. the day it is scheduled to come off regardless of your appointment time.     Estimated time to finish at 11:30am Friday, September 08, 2022   If the display on your pump reads "Low Volume" and it is beeping, take the batteries out of the pump and come to the cancer center for it to be taken off.   If the pump alarms go off prior to the pump reading "Low Volume" then call [phone] and someone can assist you.  If the plunger comes out and the chemotherapy medication is leaking out, please use your home chemo spill kit to clean up the spill. Do NOT use paper towels or other household products.  If you have problems or questions regarding your pump, please call either [phone] (24 hours a day) or the cancer center Monday-Friday 8:00 a.m.- 4:30 p.m. at the clinic number and we will assist you. If you are unable to get assistance, then go to the nearest Emergency Department and ask the staff to contact the IV team for assistance.  Discharge Instructions: Thank you for choosing [facility] to provide your oncology and hematology care.   If you have a lab appointment with the [facility], please go directly to the [facility] and check in at the registration area.   Wear comfortable clothing and clothing appropriate for easy access to any Portacath or PICC line.   We strive to give you quality time with your provider. You may need to reschedule your appointment if you arrive late (15 or more minutes).  Arriving late affects you and other patients whose appointments are after yours.  Also, if you miss three or more appointments without notifying the office, you may be dismissed from the clinic at the provider's discretion.      For  prescription refill requests, have your pharmacy contact our office and allow 72 hours for refills to be completed.    Today you received the following chemotherapy and/or immunotherapy agents Oxaliplatin, Leucovorin, Fluorouracil.      To help prevent nausea and vomiting after your treatment, we encourage you to take your nausea medication as directed.  BELOW ARE SYMPTOMS THAT SHOULD BE REPORTED IMMEDIATELY: *FEVER GREATER THAN 100.4 F (38 C) OR HIGHER *CHILLS OR SWEATING *NAUSEA AND VOMITING THAT IS NOT CONTROLLED WITH YOUR NAUSEA MEDICATION *UNUSUAL SHORTNESS OF BREATH *UNUSUAL BRUISING OR BLEEDING *URINARY PROBLEMS (pain or burning when urinating, or frequent urination) *BOWEL PROBLEMS (unusual diarrhea, constipation, pain near the anus) TENDERNESS IN MOUTH AND THROAT WITH OR WITHOUT PRESENCE OF ULCERS (sore throat, sores in mouth, or a toothache) UNUSUAL RASH, SWELLING OR PAIN  UNUSUAL VAGINAL DISCHARGE OR ITCHING   Items with * indicate a potential emergency and should be followed up as soon as possible or go to the Emergency Department if any problems should occur.  Please show the CHEMOTHERAPY ALERT CARD or IMMUNOTHERAPY ALERT CARD at check-in to the Emergency Department and triage nurse.  Should you have questions after your visit or need to cancel or reschedule your appointment, please contact [facility]  Dept: [phone]  and follow the prompts.  Office hours are 8:00 a.m. to 4:30 p.m. Monday - Friday. Please note  that voicemails left after 4:00 p.m. may not be returned until the following business day.  We are closed weekends and major holidays. You have access to a nurse at all times for urgent questions. Please call the main number to the clinic Dept: [phone] and follow the prompts.   For any non-urgent questions, you may also contact your provider using MyChart. We now offer e-Visits for anyone 44 and older to request care online  for non-urgent symptoms. For details visit [email or web address].   Also download the MyChart app! Go to the app store, search "MyChart", open the app, select [facility], and log in with your MyChart username and password.  Oxaliplatin Injection What is this medication? OXALIPLATIN (ox AL i PLA tin) treats some types of cancer. It works by slowing down the growth of cancer cells. This medicine may be used for other purposes; ask your health care provider or pharmacist if you have questions. COMMON BRAND NAME(S): Eloxatin What should I tell my care team before I take this medication? They need to know if you have any of these conditions: Heart disease History of irregular heartbeat or rhythm Liver disease Low blood cell levels (white cells, red cells, and platelets) Lung or breathing disease, such as asthma Take medications that treat or prevent blood clots Tingling of the fingers, toes, or other nerve disorder An unusual or allergic reaction to oxaliplatin, other medications, foods, dyes, or preservatives If you or your partner are pregnant or trying to get pregnant Breast-feeding How should I use this medication? This medication is injected into a vein. It is given by your care team in a hospital or clinic setting. Talk to your care team about the use of this medication in children. Special care may be needed. Overdosage: If you think you have taken too much of this medicine contact a poison control center or emergency room at once. NOTE: This medicine is only for you. Do not share this medicine with others. What if I miss a dose? Keep appointments for follow-up doses. It is important not to miss a dose. Call your care team if you are unable to keep an appointment. What may interact with this medication? Do not take this medication with any of the following: Cisapride Dronedarone Pimozide Thioridazine This medication may also interact with the following: Aspirin and aspirin-like  medications Certain medications that treat or prevent blood clots, such as warfarin, apixaban, dabigatran, and rivaroxaban Cisplatin Cyclosporine Diuretics Medications for infection, such as acyclovir, adefovir, amphotericin B, bacitracin, cidofovir, foscarnet, ganciclovir, gentamicin, pentamidine, vancomycin NSAIDs, medications for pain and inflammation, such as ibuprofen or naproxen Other medications that cause heart rhythm changes Pamidronate Zoledronic acid This list may not describe all possible interactions. Give your health care provider a list of all the medicines, herbs, non-prescription drugs, or dietary supplements you use. Also tell them if you smoke, drink alcohol, or use illegal drugs. Some items may interact with your medicine. What should I watch for while using this medication? Your condition will be monitored carefully while you are receiving this medication. You may need blood work while taking this medication. This medication may make you feel generally unwell. This is not uncommon as chemotherapy can affect healthy cells as well as cancer cells. Report any side effects. Continue your course of treatment even though you feel ill unless your care team tells you to stop. This medication may increase your risk of getting an infection. Call your care team for advice if you get a fever, chills, sore  throat, or other symptoms of a cold or flu. Do not treat yourself. Try to avoid being around people who are sick. Avoid taking medications that contain aspirin, acetaminophen, ibuprofen, naproxen, or ketoprofen unless instructed by your care team. These medications may hide a fever. Be careful brushing or flossing your teeth or using a toothpick because you may get an infection or bleed more easily. If you have any dental work done, tell your dentist you are receiving this medication. This medication can make you more sensitive to cold. Do not drink cold drinks or use ice. Cover exposed  skin before coming in contact with cold temperatures or cold objects. When out in cold weather wear warm clothing and cover your mouth and nose to warm the air that goes into your lungs. Tell your care team if you get sensitive to the cold. Talk to your care team if you or your partner are pregnant or think either of you might be pregnant. This medication can cause serious birth defects if taken during pregnancy and for 9 months after the last dose. A negative pregnancy test is required before starting this medication. A reliable form of contraception is recommended while taking this medication and for 9 months after the last dose. Talk to your care team about effective forms of contraception. Do not father a child while taking this medication and for 6 months after the last dose. Use a condom while having sex during this time period. Do not breastfeed while taking this medication and for 3 months after the last dose. This medication may cause infertility. Talk to your care team if you are concerned about your fertility. What side effects may I notice from receiving this medication? Side effects that you should report to your care team as soon as possible: Allergic reactions--skin rash, itching, hives, swelling of the face, lips, tongue, or throat Bleeding--bloody or black, tar-like stools, vomiting blood or brown material that looks like coffee grounds, red or dark brown urine, small red or purple spots on skin, unusual bruising or bleeding Dry cough, shortness of breath or trouble breathing Heart rhythm changes--fast or irregular heartbeat, dizziness, feeling faint or lightheaded, chest pain, trouble breathing Infection--fever, chills, cough, sore throat, wounds that don't heal, pain or trouble when passing urine, general feeling of discomfort or being unwell Liver injury--right upper belly pain, loss of appetite, nausea, light-colored stool, dark yellow or brown urine, yellowing skin or eyes, unusual  weakness or fatigue Low red blood cell level--unusual weakness or fatigue, dizziness, headache, trouble breathing Muscle injury--unusual weakness or fatigue, muscle pain, dark yellow or brown urine, decrease in amount of urine Pain, tingling, or numbness in the hands or feet Sudden and severe headache, confusion, change in vision, seizures, which may be signs of posterior reversible encephalopathy syndrome (PRES) Unusual bruising or bleeding Side effects that usually do not require medical attention (report to your care team if they continue or are bothersome): Diarrhea Nausea Pain, redness, or swelling with sores inside the mouth or throat Unusual weakness or fatigue Vomiting This list may not describe all possible side effects. Call your doctor for medical advice about side effects. You may report side effects to FDA at 1-800-FDA-1088. Where should I keep my medication? This medication is given in a hospital or clinic. It will not be stored at home. NOTE: This sheet is a summary. It may not cover all possible information. If you have questions about this medicine, talk to your doctor, pharmacist, or health care provider.  2023  Elsevier/Gold Standard (2007-09-21 00:00:00)  Leucovorin Injection What is this medication? LEUCOVORIN (loo koe VOR in) prevents side effects from certain medications, such as methotrexate. It works by increasing folate levels. This helps protect healthy cells in your body. It may also be used to treat anemia caused by low levels of folate. It can also be used with fluorouracil, a type of chemotherapy, to treat colorectal cancer. It works by increasing the effects of fluorouracil in the body. This medicine may be used for other purposes; ask your health care provider or pharmacist if you have questions. What should I tell my care team before I take this medication? They need to know if you have any of these conditions: Anemia from low levels of vitamin B12 in the  blood An unusual or allergic reaction to leucovorin, folic acid, other medications, foods, dyes, or preservatives Pregnant or trying to get pregnant Breastfeeding How should I use this medication? This medication is injected into a vein or a muscle. It is given by your care team in a hospital or clinic setting. Talk to your care team about the use of this medication in children. Special care may be needed. Overdosage: If you think you have taken too much of this medicine contact a poison control center or emergency room at once. NOTE: This medicine is only for you. Do not share this medicine with others. What if I miss a dose? Keep appointments for follow-up doses. It is important not to miss your dose. Call your care team if you are unable to keep an appointment. What may interact with this medication? Capecitabine Fluorouracil Phenobarbital Phenytoin Primidone Trimethoprim;sulfamethoxazole This list may not describe all possible interactions. Give your health care provider a list of all the medicines, herbs, non-prescription drugs, or dietary supplements you use. Also tell them if you smoke, drink alcohol, or use illegal drugs. Some items may interact with your medicine. What should I watch for while using this medication? Your condition will be monitored carefully while you are receiving this medication. This medication may increase the side effects of 5-fluorouracil. Tell your care team if you have diarrhea or mouth sores that do not get better or that get worse. What side effects may I notice from receiving this medication? Side effects that you should report to your care team as soon as possible: Allergic reactions--skin rash, itching, hives, swelling of the face, lips, tongue, or throat This list may not describe all possible side effects. Call your doctor for medical advice about side effects. You may report side effects to FDA at 1-800-FDA-1088. Where should I keep my  medication? This medication is given in a hospital or clinic. It will not be stored at home. NOTE: This sheet is a summary. It may not cover all possible information. If you have questions about this medicine, talk to your doctor, pharmacist, or health care provider.  2023 Elsevier/Gold Standard (2022-01-03 00:00:00)  Fluorouracil Injection What is this medication? FLUOROURACIL (flure oh YOOR a sil) treats some types of cancer. It works by slowing down the growth of cancer cells. This medicine may be used for other purposes; ask your health care provider or pharmacist if you have questions. COMMON BRAND NAME(S): Adrucil What should I tell my care team before I take this medication? They need to know if you have any of these conditions: Blood disorders Dihydropyrimidine dehydrogenase (DPD) deficiency Infection, such as chickenpox, cold sores, herpes Kidney disease Liver disease Poor nutrition Recent or ongoing radiation therapy An unusual or allergic  reaction to fluorouracil, other medications, foods, dyes, or preservatives If you or your partner are pregnant or trying to get pregnant Breast-feeding How should I use this medication? This medication is injected into a vein. It is administered by your care team in a hospital or clinic setting. Talk to your care team about the use of this medication in children. Special care may be needed. Overdosage: If you think you have taken too much of this medicine contact a poison control center or emergency room at once. NOTE: This medicine is only for you. Do not share this medicine with others. What if I miss a dose? Keep appointments for follow-up doses. It is important not to miss your dose. Call your care team if you are unable to keep an appointment. What may interact with this medication? Do not take this medication with any of the following: Live virus vaccines This medication may also interact with the following: Medications that treat  or prevent blood clots, such as warfarin, enoxaparin, dalteparin This list may not describe all possible interactions. Give your health care provider a list of all the medicines, herbs, non-prescription drugs, or dietary supplements you use. Also tell them if you smoke, drink alcohol, or use illegal drugs. Some items may interact with your medicine. What should I watch for while using this medication? Your condition will be monitored carefully while you are receiving this medication. This medication may make you feel generally unwell. This is not uncommon as chemotherapy can affect healthy cells as well as cancer cells. Report any side effects. Continue your course of treatment even though you feel ill unless your care team tells you to stop. In some cases, you may be given additional medications to help with side effects. Follow all directions for their use. This medication may increase your risk of getting an infection. Call your care team for advice if you get a fever, chills, sore throat, or other symptoms of a cold or flu. Do not treat yourself. Try to avoid being around people who are sick. This medication may increase your risk to bruise or bleed. Call your care team if you notice any unusual bleeding. Be careful brushing or flossing your teeth or using a toothpick because you may get an infection or bleed more easily. If you have any dental work done, tell your dentist you are receiving this medication. Avoid taking medications that contain aspirin, acetaminophen, ibuprofen, naproxen, or ketoprofen unless instructed by your care team. These medications may hide a fever. Do not treat diarrhea with over the counter products. Contact your care team if you have diarrhea that lasts more than 2 days or if it is severe and watery. This medication can make you more sensitive to the sun. Keep out of the sun. If you cannot avoid being in the sun, wear protective clothing and sunscreen. Do not use sun lamps,  tanning beds, or tanning booths. Talk to your care team if you or your partner wish to become pregnant or think you might be pregnant. This medication can cause serious birth defects if taken during pregnancy and for 3 months after the last dose. A reliable form of contraception is recommended while taking this medication and for 3 months after the last dose. Talk to your care team about effective forms of contraception. Do not father a child while taking this medication and for 3 months after the last dose. Use a condom while having sex during this time period. Do not breastfeed while taking this medication.  This medication may cause infertility. Talk to your care team if you are concerned about your fertility. What side effects may I notice from receiving this medication? Side effects that you should report to your care team as soon as possible: Allergic reactions--skin rash, itching, hives, swelling of the face, lips, tongue, or throat Heart attack--pain or tightness in the chest, shoulders, arms, or jaw, nausea, shortness of breath, cold or clammy skin, feeling faint or lightheaded Heart failure--shortness of breath, swelling of the ankles, feet, or hands, sudden weight gain, unusual weakness or fatigue Heart rhythm changes--fast or irregular heartbeat, dizziness, feeling faint or lightheaded, chest pain, trouble breathing High ammonia level--unusual weakness or fatigue, confusion, loss of appetite, nausea, vomiting, seizures Infection--fever, chills, cough, sore throat, wounds that don't heal, pain or trouble when passing urine, general feeling of discomfort or being unwell Low red blood cell level--unusual weakness or fatigue, dizziness, headache, trouble breathing Pain, tingling, or numbness in the hands or feet, muscle weakness, change in vision, confusion or trouble speaking, loss of balance or coordination, trouble walking, seizures Redness, swelling, and blistering of the skin over hands and  feet Severe or prolonged diarrhea Unusual bruising or bleeding Side effects that usually do not require medical attention (report to your care team if they continue or are bothersome): Dry skin Headache Increased tears Nausea Pain, redness, or swelling with sores inside the mouth or throat Sensitivity to light Vomiting This list may not describe all possible side effects. Call your doctor for medical advice about side effects. You may report side effects to FDA at 1-800-FDA-1088. Where should I keep my medication? This medication is given in a hospital or clinic. It will not be stored at home. NOTE: This sheet is a summary. It may not cover all possible information. If you have questions about this medicine, talk to your doctor, pharmacist, or health care provider.  2023 Elsevier/Gold Standard (2021-11-29 00:00:00)

## 2022-09-06 NOTE — Progress Notes (Signed)
  [facility] OFFICE PROGRESS NOTE   Diagnosis: Colon cancer  INTERVAL HISTORY:   Joan Owen returns as scheduled.  She completed cycle 2 FOLFOX 08/23/2022.  She had mild nausea.  No vomiting.  No mouth sores.  No diarrhea.  Cold sensitivity lasted about 3 days.  No persistent neuropathy symptoms.  2 days ago she noted bright red blood in the toilet following a bowel movement.  She has had no bleeding since.  Objective:  Vital signs in last 24 hours:  Blood pressure 118/82, pulse 78, temperature 97.9 F (36.6 C), temperature source Oral, resp. rate 18, height '5\' 4"'$  (1.626 m), weight 146 lb 3.2 oz (66.3 kg), last menstrual period 03/28/2013, SpO2 99 %.    HEENT: No thrush or ulcers. Resp: Lungs clear bilaterally. Cardio: Regular rate and rhythm. GI: Abdomen soft and nontender.  No hepatosplenomegaly.  Externally there are skin tags versus soft hemorrhoids.  On digital rectal exam no mass palpated, soft stool in the rectal vault, brown in color, tested negative for occult blood. Vascular: No leg edema. Skin: Palms without erythema. Port-A-Cath without erythema.   Lab Results:  Lab Results  Component Value Date   WBC 3.6 (L) 09/06/2022   HGB 12.4 09/06/2022   HCT 36.4 09/06/2022   MCV 90.8 09/06/2022   PLT 138 (L) 09/06/2022   NEUTROABS 2.0 09/06/2022    Imaging:  No results found.  Medications: I have reviewed the patient's current medications.  Assessment/Plan: Colon cancer, stage IIa (T3 N0 M0), sigmoidectomy 04/29/2021 Colonoscopy 03/24/2021-partially obstructing mass in the sigmoid colon, biopsy-adenocarcinoma CTs 03/28/2021-sigmoid colon mass, prominent presacral and sigmoid mesentery lymph nodes-nonspecific, no evidence of distant metastatic disease, sclerotic lesions at the left intertrochanteric region-likely a bone island Sigmoidectomy 04/29/2021-sigmoid colon tumor, 0/16 lymph nodes, no lymphovascular or perineural invasion, no loss of mismatch  repair protein expression, MSS Foundation 1-MSS, tumor mutation burden 6, K-ras and NRAS wild-type, ERBB2 amplification-equivocal Colonoscopy 04/27/2022-polyps removed from the ascending and transverse colon-tubular adenomas 05/31/2022 CEA 8.54 06/28/2022 CEA 9.48 07/10/2022 CTs-new lesion central left hepatic lobe 07/16/2022 MRI 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 Seen by Dr. Fayrene Helper with recommendation for a 27-monthcourse of chemotherapy prior to surgery Cycle 1 FOLFOX 08/09/2022 Cycle 2 FOLFOX 08/23/2022 Cycle 3 FOLFOX 09/06/2022  2.   Rectal bleeding, appointment with gastroenterology 09/25/2022  Disposition: Ms. SKehresappears stable.  She has completed 2 cycles of FOLFOX.  She is tolerating treatment well.  Plan to proceed with cycle 3 today as scheduled.  Labs from today reviewed.  CBC and chemistry panel adequate to proceed as above.  We discussed that the CEA is higher than it was a month ago.  We will continue to follow closely with plan for repeat CEA in 2 weeks.  She had another episode of rectal bleeding recently.  Rectal exam today was unremarkable, stool negative for occult blood.  Hemoglobin is stable.  She has an appointment with gastroenterology 09/25/2022.  She will contact the office with persistent and/or increased rectal bleeding.  She will return for lab, follow-up, cycle 4 FOLFOX in 2 weeks.  We are available to see her sooner if needed.    LNed CardANP/GNP-BC   09/06/2022  9:26 AM

## 2022-09-06 NOTE — Patient Instructions (Signed)

## 2022-09-06 NOTE — Progress Notes (Signed)
Patient seen by Lisa Thomas NP today  Vitals are within treatment parameters.  Labs reviewed by Lisa Thomas NP and are within treatment parameters.  Per physician team, patient is ready for treatment and there are NO modifications to the treatment plan.     

## 2022-09-07 ENCOUNTER — Telehealth: Payer: Self-pay

## 2022-09-07 ENCOUNTER — Encounter: Payer: Self-pay | Admitting: Oncology

## 2022-09-07 NOTE — Telephone Encounter (Signed)
Patient is reschedule for [facility] on 09/11/22

## 2022-09-08 ENCOUNTER — Inpatient Hospital Stay: Payer: Medicare Other

## 2022-09-08 VITALS — BP 141/79 | HR 68 | Temp 97.5°F | Resp 18

## 2022-09-08 DIAGNOSIS — C187 Malignant neoplasm of sigmoid colon: Secondary | ICD-10-CM | POA: Diagnosis not present

## 2022-09-08 DIAGNOSIS — Z85038 Personal history of other malignant neoplasm of large intestine: Secondary | ICD-10-CM

## 2022-09-08 DIAGNOSIS — Z5111 Encounter for antineoplastic chemotherapy: Secondary | ICD-10-CM | POA: Diagnosis not present

## 2022-09-08 DIAGNOSIS — Z452 Encounter for adjustment and management of vascular access device: Secondary | ICD-10-CM | POA: Diagnosis not present

## 2022-09-08 MED ORDER — SODIUM CHLORIDE 0.9% FLUSH
10.0000 mL | INTRAVENOUS | Status: DC | PRN
  Administered 2022-09-08: 10 mL

## 2022-09-08 MED ORDER — HEPARIN SOD (PORK) LOCK FLUSH 100 UNIT/ML IV SOLN
500.0000 [IU] | Freq: Once | INTRAVENOUS | Status: AC | PRN
  Administered 2022-09-08: 500 [IU]

## 2022-09-08 NOTE — Patient Instructions (Signed)

## 2022-09-10 NOTE — Progress Notes (Unsigned)
09/10/2022 Joan Owen [id number] [birth year]   Chief Complaint: Rectal bleeding, history of colon cancer   History of Present Illness: Joan Owen is a 67 year old female with a past medical history of osteoporosis, positive Covid 19 test 07/2019, facial basal cell cancer, a hyperplastic colon polyp and colon cancer stage IIa s/p sigmoid resection 04/2021 with liver metastasis started on FOLFOX 07/2022 through 10/2022. She is known by Dr. Hilarie Fredrickson. She presents today for further evaluation regarding rectal bleeding. She passed a moderate amount of bright red blood with a solid stool on 07/10/2022. Since then, she intermittently sees a streak of bright red blood on the stool. Last week, she had passed a hard stool with a moderate amount of bright red blood. Her stools are "rock hard" and she often strains to pass a BM with associated anorectal pain. She was seen by Ned Card oncology NP 09/06/2022, rectal exam showed heme negative stool at that time. She was advised to take a stool softener and to follow up in our office. CEA 12.27 -> 17.68. She underwent a surveillance CT on 07/10/2022 which identified a metastatic liver mass. She was started on FOLFOX on 08/09/2022, received 3 out of 6 infusions. She was evaluated by surgeon Dr. Tedd Sias at [facility], with plans to repeat liver imaging after she finishes her FOLFOX infusions 10/2022 with liver resection surgery scheduled on 4/02/202. No diarrhea on FOLFOX.  Hemoglobin level remained stable at 12.4.  She was initially diagnosed with colon cancer per colonoscopy 03/24/2021.  At that time, a malignant partially obstructing tumor in the distal sigmoid colon was identified, path report consistent with invasive adenocarcinoma.  A 6 mm polyp was removed from the transverse colon, path showed submucosal leiomyoma and one 4 mm hyper plastic polyp was removed from the rectum.  She subsequently underwent sigmoid resection 04/2021.  A repeat colonoscopy 04/27/2022 identified  two 5 mm tubular adenomatous polyps removed from the ascending and transverse colon.  Diverticulosis in the proximal sigmoid and descending colon, patent end-to-end colocolonic anastomosis and hypertrophied anal papillae were noted.      Latest Ref Rng & Units 09/06/2022    8:55 AM 08/23/2022   10:39 AM 08/09/2022    8:37 AM  CBC  WBC 4.0 - 10.5 K/uL 3.6  4.8  4.0   Hemoglobin 12.0 - 15.0 g/dL 12.4  12.0  12.6   Hematocrit 36.0 - 46.0 % 36.4  35.7  38.0   Platelets 150 - 400 K/uL 138  177  222        Latest Ref Rng & Units 09/06/2022    8:55 AM 08/23/2022   10:39 AM 08/09/2022    8:37 AM  CMP  Glucose 70 - 99 mg/dL 100  94  83   BUN 8 - 23 mg/dL '19  22  20   '$ Creatinine 0.44 - 1.00 mg/dL 0.72  0.71  0.78   Sodium 135 - 145 mmol/L 140  139  139   Potassium 3.5 - 5.1 mmol/L 4.0  4.0  4.1   Chloride 98 - 111 mmol/L 104  103  103   CO2 22 - 32 mmol/L '27  27  28   '$ Calcium 8.9 - 10.3 mg/dL 9.6  9.3  9.8   Total Protein 6.5 - 8.1 g/dL 6.7  6.8  7.1   Total Bilirubin 0.3 - 1.2 mg/dL 0.7  1.2  1.1   Alkaline Phos 38 - 126 U/L 51  50  53   AST  15 - 41 U/L '13  18  19   '$ ALT 0 - 44 U/L '16  18  20     '$ IMAGE STUDIES:  Liver MRI 07/16/2022: Solitary 4.3 cm hypovascular mass in the left hepatic lobe, highly suspicious for liver metastasis.  No other sites of metastatic disease identified within the abdomen.  Chest/Abd/Pelvic CT 06/2022: 1. New lesion in the central LEFT hepatic lobe measuring 4.2 x 3.4 cm most consistent with METASTATIC COLORECTAL CARCINOMA to the liver. New from comparison CT 03/28/2021. 2. Mid sigmoid colon anastomosis without evidence local recurrence. 3. No adenopathy in the abdomen pelvis. 4. No skeletal metastasis.  GI PROCEDURES/SURGERY:  Colonoscopy 04/27/2022: One 5 mm polyp in the ascending colon, resected and retrieved. One 5 mm polyp in the transverse colon, resected and retrieved. Moderate diverticulosis in proximal sigmoid and descending colon Patent  end-to-end colocolonic anastomosis Anal papillae were hypertrophied on retroflexed views of the rectum Recall colonoscopy 3 years TUBULAR ADENOMA(S). - NO HIGH GRADE DYSPLASIA OR MALIGNANCY.  Sigmoid resection 04/2021: A. COLON, RECTOSIGMOID, RESECTION:  - Invasive moderately differentiated adenocarcinoma, 4 cm, involving  distal sigmoid colon  - Carcinoma invades into the subserosa  - Resection margins are negative for carcinoma  - Sixteen lymph nodes, negative for carcinoma (0/16)  - Diverticulosis   Colonoscopy 03/24/2021: Malignant partially obstructing tumor in the distal sigmoid colon One 6 mm polyp in the transverse colon, removed and resected. One 4 mm polyp in rectum, removed and resected. Diverticulosis in the sigmoid and distal descending colon Small internal hemorrhoids Repeat colonoscopy in 1 year 1. Transverse Colon Biopsy, polyp (1) - SUBMUCOSAL LEIOMYOMA. - NO DYSPLASIA OR MALIGNANCY. 2. Sigmoid Colon Biopsy - INVASIVE ADENOCARCINOMA. 3. Rectum, biopsy, polyp (1) - HYPERPLASTIC POLYP. - NO DYSPLASIA OR MALIGNANCY.  Colonoscopy by Dr. Olevia Perches on 09/10/2013: a 3-97m hyperplastic polyp was removed from the sigmoid colon.  Current Outpatient Medications on File Prior to Visit  Medication Sig Dispense Refill   ALPRAZolam (XANAX) 0.5 MG tablet Take 1 tablet (0.5 mg total) by mouth 2 (two) times daily as needed for anxiety. 40 tablet 0   Calcium Carb-Cholecalciferol (CALCIUM 1000 + D PO) Take 2 tablets by mouth daily.     ibuprofen (ADVIL) 200 MG tablet Take 400 mg by mouth every 6 (six) hours as needed for moderate pain.     lidocaine-prilocaine (EMLA) cream Apply 1 Application topically as needed ([place] to [place] hour prior to appt). 30 g 0   Melatonin 5 MG CAPS Take 5 mg by mouth at bedtime as needed (sleep).     ondansetron (ZOFRAN) 8 MG tablet Take 1 tablet (8 mg total) by mouth every 8 (eight) hours as needed for nausea or vomiting (after chemo day 3 as needed  for nausea). 20 tablet 0   prochlorperazine (COMPAZINE) 10 MG tablet Take 1 tablet (10 mg total) by mouth every 6 (six) hours as needed for nausea or vomiting. 30 tablet 0   No current facility-administered medications on file prior to visit.   No Known Allergies  Current Medications, Allergies, Past Medical History, Past Surgical History, Family History and Social History were reviewed in [employer].  Review of Systems:   Constitutional: Negative for fever, sweats, chills or weight loss.  Respiratory: Negative for shortness of breath.   Cardiovascular: Negative for chest pain, palpitations and leg swelling.  Gastrointestinal: See HPI.  Musculoskeletal: Negative for back pain or muscle aches.  Neurological: Negative for dizziness, headaches or paresthesias.  Physical Exam: LMP 03/28/2013  BP 118/62   Pulse 97   Ht '5\' 4"'$  (1.626 m)   Wt 144 lb (65.3 kg)   LMP 03/28/2013   SpO2 93%   BMI 24.72 kg/m   General: 67 year old female in no acute distress. Head: Normocephalic and atraumatic. Eyes: No scleral icterus. Conjunctiva pink . Ears: Normal auditory acuity. Mouth: Dentition intact. No ulcers or lesions.  Lungs: Clear throughout to auscultation. Heart: Regular rate and rhythm, no murmur. Abdomen: Soft, nontender and nondistended. No masses or hepatomegaly. Normal bowel sounds x 4 quadrants.  Rectal: Anterior anal fissure with sentinel tag/hypertrophied papillae. No prolapsed internal hemorrhoids.  Rectal vault filled with hard stool. Musculoskeletal: Symmetrical with no gross deformities. Extremities: No edema. Neurological: Alert oriented x 4. No focal deficits.  Psychological: Alert and cooperative. Normal mood and affect  Assessment and Recommendations:  67 year old female with a history adenocarcinoma to the sigmoid colon s/p sigmoid resection 04/2021 with anorectal pain and bright red rectal bleeding. A small anterior fissure with  associated sentinel tag/hypertrophied anal papillae identified on exam today. Her most recent colonoscopy 04/27/2022 identified two 5 mm tubular adenomatous polyps removed from the ascending and transverse colon, diverticulosis in the proximal sigmoid and descending colon, patent end-to-end colocolonic anastomosis and hypetrophied anal papillae.  Stable hemoglobin. -MiraLAX once daily, may increase to twice daily to avoid hard stools -Increase water to 6 to 8 glasses daily -Diltiazem 2%/lidocaine 5% ointment cream apply small amount inside into the external anal area 3 times daily for 6 weeks -Flexible sigmoidoscopy to rule out any other etiology for rectal bleeding, benefits and risks discussed including risk with sedation, risk of bleeding, perforation and infection, to further discuss with Dr. Hilarie Fredrickson. -Patient to contact office if her symptoms worsen  Colon cancer stage IIa with hepatic metastasis on FOLFOX with plans for liver resection 11/2022  Today's encounter was 25 minutes which included precharting, chart/result review, history/exam, face-to-face time used for counseling, formulating a treatment plan with follow-up and documentation.

## 2022-09-11 ENCOUNTER — Ambulatory Visit: Payer: Medicare Other | Admitting: Nurse Practitioner

## 2022-09-11 ENCOUNTER — Encounter: Payer: Self-pay | Admitting: Nurse Practitioner

## 2022-09-11 VITALS — BP 118/62 | HR 97 | Ht 64.0 in | Wt 144.0 lb

## 2022-09-11 DIAGNOSIS — Z85038 Personal history of other malignant neoplasm of large intestine: Secondary | ICD-10-CM | POA: Diagnosis not present

## 2022-09-11 DIAGNOSIS — K602 Anal fissure, unspecified: Secondary | ICD-10-CM | POA: Diagnosis not present

## 2022-09-11 DIAGNOSIS — K5900 Constipation, unspecified: Secondary | ICD-10-CM

## 2022-09-11 DIAGNOSIS — K625 Hemorrhage of anus and rectum: Secondary | ICD-10-CM | POA: Diagnosis not present

## 2022-09-11 MED ORDER — AMBULATORY NON FORMULARY MEDICATION: 0 refills | Status: DC

## 2022-09-11 NOTE — Patient Instructions (Addendum)
We have sent a prescription for Diltiazem 2% gel to [facility] for you. Using your index finger, you should apply a small amount of medication inside and into the external anal area  3 times daily x 6 weeks.  [facility]'s information is below: Address: [address] [place], [place] [address]  [phone]  *Please DO NOT go directly from our office to pick up this medication! Give the pharmacy 1 day to process the prescription as this is compounded and takes time to make.   You have been scheduled for a flexible sigmoidoscopy. Please follow the written instructions given to you at your visit today. If you use inhalers (even only as needed), please bring them with you on the day of your procedure.   Miralax- every night and may increase to twice daily as needed  Due to recent changes in healthcare laws, you may see the results of your imaging and laboratory studies on MyChart before your provider has had a chance to review them.  We understand that in some cases there may be results that are confusing or concerning to you. Not all laboratory results come back in the same time frame and the provider may be waiting for multiple results in order to interpret others.  Please give [place] 48 hours in order for your provider to thoroughly review all the results before contacting the office for clarification of your results.    Thank you for trusting me with your gastrointestinal care!   Carl Best, CRNP

## 2022-09-12 LAB — SURGICAL PATHOLOGY

## 2022-09-12 NOTE — Progress Notes (Signed)
Addendum: Reviewed and agree with assessment and management plan. Okay for flexible sigmoidoscopy though pathology likely anal due to fissure Drake Wuertz, Lajuan Lines, MD

## 2022-09-17 ENCOUNTER — Other Ambulatory Visit: Payer: Self-pay | Admitting: Oncology

## 2022-09-18 NOTE — Progress Notes (Signed)
September 14, 2022-Communication received from the patient concerning [place] approving one week of disability and a paper they sent on September 04, 2022.  Initial papers for Attending Physicians Statement - Initial was completed and faxed August 31, 2022 with confirmation received.  Message left on the patients voice mail  on September 14, 2022 that [facility] has not sent any extra papers and to please call and ask them to send the papers that need filling out to Dr. Gearldine Shown office and the will forward them to be filled out.

## 2022-09-20 ENCOUNTER — Encounter: Payer: Self-pay | Admitting: *Deleted

## 2022-09-20 ENCOUNTER — Inpatient Hospital Stay: Payer: Medicare Other

## 2022-09-20 ENCOUNTER — Inpatient Hospital Stay: Payer: Medicare Other | Attending: Oncology

## 2022-09-20 ENCOUNTER — Inpatient Hospital Stay: Payer: Medicare Other | Admitting: Oncology

## 2022-09-20 VITALS — BP 128/73 | HR 60 | Resp 20

## 2022-09-20 VITALS — BP 122/68 | HR 71 | Temp 98.2°F | Resp 18 | Ht 64.0 in | Wt 146.2 lb

## 2022-09-20 DIAGNOSIS — C187 Malignant neoplasm of sigmoid colon: Secondary | ICD-10-CM | POA: Diagnosis not present

## 2022-09-20 DIAGNOSIS — D709 Neutropenia, unspecified: Secondary | ICD-10-CM | POA: Diagnosis not present

## 2022-09-20 DIAGNOSIS — Z5189 Encounter for other specified aftercare: Secondary | ICD-10-CM | POA: Diagnosis not present

## 2022-09-20 DIAGNOSIS — Z85038 Personal history of other malignant neoplasm of large intestine: Secondary | ICD-10-CM

## 2022-09-20 DIAGNOSIS — Z5111 Encounter for antineoplastic chemotherapy: Secondary | ICD-10-CM | POA: Diagnosis not present

## 2022-09-20 LAB — CMP (CANCER CENTER ONLY)
ALT: 21 U/L (ref 0–44)
AST: 16 U/L (ref 15–41)
Albumin: 4 g/dL (ref 3.5–5.0)
Alkaline Phosphatase: 48 U/L (ref 38–126)
Anion gap: 8 (ref 5–15)
BUN: 16 mg/dL (ref 8–23)
CO2: 29 mmol/L (ref 22–32)
Calcium: 9.7 mg/dL (ref 8.9–10.3)
Chloride: 103 mmol/L (ref 98–111)
Creatinine: 0.7 mg/dL (ref 0.44–1.00)
GFR, Estimated: >60 mL/min (ref >60)
Glucose, Bld: 95 mg/dL (ref 70–99)
Potassium: 4.3 mmol/L (ref 3.5–5.1)
Sodium: 140 mmol/L (ref 135–145)
Total Bilirubin: 0.8 mg/dL (ref 0.3–1.2)
Total Protein: 7 g/dL (ref 6.5–8.1)

## 2022-09-20 LAB — CBC WITH DIFFERENTIAL (CANCER CENTER ONLY)
Abs Immature Granulocytes: 0 10*3/uL (ref 0.00–0.07)
Basophils Absolute: 0 10*3/uL (ref 0.0–0.1)
Basophils Relative: 1 %
Eosinophils Absolute: 0.1 10*3/uL (ref 0.0–0.5)
Eosinophils Relative: 2 %
HCT: 31.8 % — ABNORMAL LOW (ref 36.0–46.0)
Hemoglobin: 10.7 g/dL — ABNORMAL LOW (ref 12.0–15.0)
Immature Granulocytes: 0 %
Lymphocytes Relative: 31 %
Lymphs Abs: 0.8 10*3/uL (ref 0.7–4.0)
MCH: 30.4 pg (ref 26.0–34.0)
MCHC: 33.6 g/dL (ref 30.0–36.0)
MCV: 90.3 fL (ref 80.0–100.0)
Monocytes Absolute: 0.3 10*3/uL (ref 0.1–1.0)
Monocytes Relative: 13 %
Neutro Abs: 1.4 10*3/uL — ABNORMAL LOW (ref 1.7–7.7)
Neutrophils Relative %: 53 %
Platelet Count: 130 10*3/uL — ABNORMAL LOW (ref 150–400)
RBC: 3.52 MIL/uL — ABNORMAL LOW (ref 3.87–5.11)
RDW: 13.7 % (ref 11.5–15.5)
WBC Count: 2.5 10*3/uL — ABNORMAL LOW (ref 4.0–10.5)
nRBC: 0 % (ref 0.0–0.2)

## 2022-09-20 LAB — CEA (ACCESS): CEA (CHCC): 8.89 ng/mL — ABNORMAL HIGH (ref 0.00–5.00)

## 2022-09-20 MED ORDER — PALONOSETRON HCL INJECTION 0.25 MG/5ML
0.2500 mg | Freq: Once | INTRAVENOUS | Status: AC
  Administered 2022-09-20: 0.25 mg via INTRAVENOUS
  Filled 2022-09-20: qty 5

## 2022-09-20 MED ORDER — OXALIPLATIN CHEMO INJECTION 100 MG/20ML
85.0000 mg/m2 | Freq: Once | INTRAVENOUS | Status: AC
  Administered 2022-09-20: 150 mg via INTRAVENOUS
  Filled 2022-09-20: qty 30

## 2022-09-20 MED ORDER — FLUOROURACIL CHEMO INJECTION 2.5 GM/50ML
400.0000 mg/m2 | Freq: Once | INTRAVENOUS | Status: AC
  Administered 2022-09-20: 700 mg via INTRAVENOUS
  Filled 2022-09-20: qty 14

## 2022-09-20 MED ORDER — SODIUM CHLORIDE 0.9 % IV SOLN
10.0000 mg | Freq: Once | INTRAVENOUS | Status: AC
  Administered 2022-09-20: 10 mg via INTRAVENOUS
  Filled 2022-09-20: qty 1

## 2022-09-20 MED ORDER — LEUCOVORIN CALCIUM INJECTION 350 MG
400.0000 mg/m2 | Freq: Once | INTRAVENOUS | Status: AC
  Administered 2022-09-20: 692 mg via INTRAVENOUS
  Filled 2022-09-20 (×3): qty 34.6

## 2022-09-20 MED ORDER — SODIUM CHLORIDE 0.9 % IV SOLN
2400.0000 mg/m2 | INTRAVENOUS | Status: DC
  Administered 2022-09-20: 4150 mg via INTRAVENOUS
  Filled 2022-09-20: qty 83

## 2022-09-20 MED ORDER — DEXTROSE 5 % IV SOLN: Freq: Once | INTRAVENOUS | Status: AC

## 2022-09-20 NOTE — Patient Instructions (Signed)
Joan Owen   Discharge Instructions: Thank you for choosing [facility] to provide your oncology and hematology care.   If you have a lab appointment with the [facility], please go directly to the [facility] and check in at the registration area.   Wear comfortable clothing and clothing appropriate for easy access to any Portacath or PICC line.   We strive to give you quality time with your provider. You may need to reschedule your appointment if you arrive late (15 or more minutes).  Arriving late affects you and other patients whose appointments are after yours.  Also, if you miss three or more appointments without notifying the office, you may be dismissed from the clinic at the provider's discretion.      For prescription refill requests, have your pharmacy contact our office and allow 72 hours for refills to be completed.    Today you received the following chemotherapy and/or immunotherapy agents Oxaliplatin (ELOXATIN), Leucovorin & Flourouracil (ADRUCIL).      To help prevent nausea and vomiting after your treatment, we encourage you to take your nausea medication as directed.  BELOW ARE SYMPTOMS THAT SHOULD BE REPORTED IMMEDIATELY: *FEVER GREATER THAN 100.4 F (38 C) OR HIGHER *CHILLS OR SWEATING *NAUSEA AND VOMITING THAT IS NOT CONTROLLED WITH YOUR NAUSEA MEDICATION *UNUSUAL SHORTNESS OF BREATH *UNUSUAL BRUISING OR BLEEDING *URINARY PROBLEMS (pain or burning when urinating, or frequent urination) *BOWEL PROBLEMS (unusual diarrhea, constipation, pain near the anus) TENDERNESS IN MOUTH AND THROAT WITH OR WITHOUT PRESENCE OF ULCERS (sore throat, sores in mouth, or a toothache) UNUSUAL RASH, SWELLING OR PAIN  UNUSUAL VAGINAL DISCHARGE OR ITCHING   Items with * indicate a potential emergency and should be followed up as soon as possible or go to the Emergency Department if any problems should occur.  Please show the  CHEMOTHERAPY ALERT CARD or IMMUNOTHERAPY ALERT CARD at check-in to the Emergency Department and triage nurse.  Should you have questions after your visit or need to cancel or reschedule your appointment, please contact [facility]  Dept: [phone]  and follow the prompts.  Office hours are 8:00 a.m. to 4:30 p.m. Monday - Friday. Please note that voicemails left after 4:00 p.m. may not be returned until the following business day.  We are closed weekends and major holidays. You have access to a nurse at all times for urgent questions. Please call the main number to the clinic Dept: [phone] and follow the prompts.   For any non-urgent questions, you may also contact your provider using MyChart. We now offer e-Visits for anyone 52 and older to request care online for non-urgent symptoms. For details visit [email or web address].   Also download the MyChart app! Go to the app store, search "MyChart", open the app, select [facility], and log in with your MyChart username and password.  Oxaliplatin Injection What is this medication? OXALIPLATIN (ox AL i PLA tin) treats some types of cancer. It works by slowing down the growth of cancer cells. This medicine may be used for other purposes; ask your health care provider or pharmacist if you have questions. COMMON BRAND NAME(S): Eloxatin What should I tell my care team before I take this medication? They need to know if you have any of these conditions: Heart disease History of irregular heartbeat or rhythm Liver disease Low blood cell levels (white cells, red cells, and platelets) Lung or breathing disease, such as asthma Take medications  that treat or prevent blood clots Tingling of the fingers, toes, or other nerve disorder An unusual or allergic reaction to oxaliplatin, other medications, foods, dyes, or preservatives If you or your partner are pregnant or trying to get pregnant Breast-feeding How  should I use this medication? This medication is injected into a vein. It is given by your care team in a hospital or clinic setting. Talk to your care team about the use of this medication in children. Special care may be needed. Overdosage: If you think you have taken too much of this medicine contact a poison control center or emergency room at once. NOTE: This medicine is only for you. Do not share this medicine with others. What if I miss a dose? Keep appointments for follow-up doses. It is important not to miss a dose. Call your care team if you are unable to keep an appointment. What may interact with this medication? Do not take this medication with any of the following: Cisapride Dronedarone Pimozide Thioridazine This medication may also interact with the following: Aspirin and aspirin-like medications Certain medications that treat or prevent blood clots, such as warfarin, apixaban, dabigatran, and rivaroxaban Cisplatin Cyclosporine Diuretics Medications for infection, such as acyclovir, adefovir, amphotericin B, bacitracin, cidofovir, foscarnet, ganciclovir, gentamicin, pentamidine, vancomycin NSAIDs, medications for pain and inflammation, such as ibuprofen or naproxen Other medications that cause heart rhythm changes Pamidronate Zoledronic acid This list may not describe all possible interactions. Give your health care provider a list of all the medicines, herbs, non-prescription drugs, or dietary supplements you use. Also tell them if you smoke, drink alcohol, or use illegal drugs. Some items may interact with your medicine. What should I watch for while using this medication? Your condition will be monitored carefully while you are receiving this medication. You may need blood work while taking this medication. This medication may make you feel generally unwell. This is not uncommon as chemotherapy can affect healthy cells as well as cancer cells. Report any side effects.  Continue your course of treatment even though you feel ill unless your care team tells you to stop. This medication may increase your risk of getting an infection. Call your care team for advice if you get a fever, chills, sore throat, or other symptoms of a cold or flu. Do not treat yourself. Try to avoid being around people who are sick. Avoid taking medications that contain aspirin, acetaminophen, ibuprofen, naproxen, or ketoprofen unless instructed by your care team. These medications may hide a fever. Be careful brushing or flossing your teeth or using a toothpick because you may get an infection or bleed more easily. If you have any dental work done, tell your dentist you are receiving this medication. This medication can make you more sensitive to cold. Do not drink cold drinks or use ice. Cover exposed skin before coming in contact with cold temperatures or cold objects. When out in cold weather wear warm clothing and cover your mouth and nose to warm the air that goes into your lungs. Tell your care team if you get sensitive to the cold. Talk to your care team if you or your partner are pregnant or think either of you might be pregnant. This medication can cause serious birth defects if taken during pregnancy and for 9 months after the last dose. A negative pregnancy test is required before starting this medication. A reliable form of contraception is recommended while taking this medication and for 9 months after the last dose. Talk to  your care team about effective forms of contraception. Do not father a child while taking this medication and for 6 months after the last dose. Use a condom while having sex during this time period. Do not breastfeed while taking this medication and for 3 months after the last dose. This medication may cause infertility. Talk to your care team if you are concerned about your fertility. What side effects may I notice from receiving this medication? Side effects that  you should report to your care team as soon as possible: Allergic reactions--skin rash, itching, hives, swelling of the face, lips, tongue, or throat Bleeding--bloody or black, tar-like stools, vomiting blood or brown material that looks like coffee grounds, red or dark brown urine, small red or purple spots on skin, unusual bruising or bleeding Dry cough, shortness of breath or trouble breathing Heart rhythm changes--fast or irregular heartbeat, dizziness, feeling faint or lightheaded, chest pain, trouble breathing Infection--fever, chills, cough, sore throat, wounds that don't heal, pain or trouble when passing urine, general feeling of discomfort or being unwell Liver injury--right upper belly pain, loss of appetite, nausea, light-colored stool, dark yellow or brown urine, yellowing skin or eyes, unusual weakness or fatigue Low red blood cell level--unusual weakness or fatigue, dizziness, headache, trouble breathing Muscle injury--unusual weakness or fatigue, muscle pain, dark yellow or brown urine, decrease in amount of urine Pain, tingling, or numbness in the hands or feet Sudden and severe headache, confusion, change in vision, seizures, which may be signs of posterior reversible encephalopathy syndrome (PRES) Unusual bruising or bleeding Side effects that usually do not require medical attention (report to your care team if they continue or are bothersome): Diarrhea Nausea Pain, redness, or swelling with sores inside the mouth or throat Unusual weakness or fatigue Vomiting This list may not describe all possible side effects. Call your doctor for medical advice about side effects. You may report side effects to FDA at 1-800-FDA-1088. Where should I keep my medication? This medication is given in a hospital or clinic. It will not be stored at home. NOTE: This sheet is a summary. It may not cover all possible information. If you have questions about this medicine, talk to your doctor,  pharmacist, or health care provider.  2023 Elsevier/Gold Standard (2007-09-21 00:00:00)  Leucovorin Injection What is this medication? LEUCOVORIN (loo koe VOR in) prevents side effects from certain medications, such as methotrexate. It works by increasing folate levels. This helps protect healthy cells in your body. It may also be used to treat anemia caused by low levels of folate. It can also be used with fluorouracil, a type of chemotherapy, to treat colorectal cancer. It works by increasing the effects of fluorouracil in the body. This medicine may be used for other purposes; ask your health care provider or pharmacist if you have questions. What should I tell my care team before I take this medication? They need to know if you have any of these conditions: Anemia from low levels of vitamin B12 in the blood An unusual or allergic reaction to leucovorin, folic acid, other medications, foods, dyes, or preservatives Pregnant or trying to get pregnant Breastfeeding How should I use this medication? This medication is injected into a vein or a muscle. It is given by your care team in a hospital or clinic setting. Talk to your care team about the use of this medication in children. Special care may be needed. Overdosage: If you think you have taken too much of this medicine contact a poison  control center or emergency room at once. NOTE: This medicine is only for you. Do not share this medicine with others. What if I miss a dose? Keep appointments for follow-up doses. It is important not to miss your dose. Call your care team if you are unable to keep an appointment. What may interact with this medication? Capecitabine Fluorouracil Phenobarbital Phenytoin Primidone Trimethoprim;sulfamethoxazole This list may not describe all possible interactions. Give your health care provider a list of all the medicines, herbs, non-prescription drugs, or dietary supplements you use. Also tell them if you  smoke, drink alcohol, or use illegal drugs. Some items may interact with your medicine. What should I watch for while using this medication? Your condition will be monitored carefully while you are receiving this medication. This medication may increase the side effects of 5-fluorouracil. Tell your care team if you have diarrhea or mouth sores that do not get better or that get worse. What side effects may I notice from receiving this medication? Side effects that you should report to your care team as soon as possible: Allergic reactions--skin rash, itching, hives, swelling of the face, lips, tongue, or throat This list may not describe all possible side effects. Call your doctor for medical advice about side effects. You may report side effects to FDA at 1-800-FDA-1088. Where should I keep my medication? This medication is given in a hospital or clinic. It will not be stored at home. NOTE: This sheet is a summary. It may not cover all possible information. If you have questions about this medicine, talk to your doctor, pharmacist, or health care provider.  2023 Elsevier/Gold Standard (2022-01-03 00:00:00)  Fluorouracil Injection What is this medication? FLUOROURACIL (flure oh YOOR a sil) treats some types of cancer. It works by slowing down the growth of cancer cells. This medicine may be used for other purposes; ask your health care provider or pharmacist if you have questions. COMMON BRAND NAME(S): Adrucil What should I tell my care team before I take this medication? They need to know if you have any of these conditions: Blood disorders Dihydropyrimidine dehydrogenase (DPD) deficiency Infection, such as chickenpox, cold sores, herpes Kidney disease Liver disease Poor nutrition Recent or ongoing radiation therapy An unusual or allergic reaction to fluorouracil, other medications, foods, dyes, or preservatives If you or your partner are pregnant or trying to get  pregnant Breast-feeding How should I use this medication? This medication is injected into a vein. It is administered by your care team in a hospital or clinic setting. Talk to your care team about the use of this medication in children. Special care may be needed. Overdosage: If you think you have taken too much of this medicine contact a poison control center or emergency room at once. NOTE: This medicine is only for you. Do not share this medicine with others. What if I miss a dose? Keep appointments for follow-up doses. It is important not to miss your dose. Call your care team if you are unable to keep an appointment. What may interact with this medication? Do not take this medication with any of the following: Live virus vaccines This medication may also interact with the following: Medications that treat or prevent blood clots, such as warfarin, enoxaparin, dalteparin This list may not describe all possible interactions. Give your health care provider a list of all the medicines, herbs, non-prescription drugs, or dietary supplements you use. Also tell them if you smoke, drink alcohol, or use illegal drugs. Some items may interact  with your medicine. What should I watch for while using this medication? Your condition will be monitored carefully while you are receiving this medication. This medication may make you feel generally unwell. This is not uncommon as chemotherapy can affect healthy cells as well as cancer cells. Report any side effects. Continue your course of treatment even though you feel ill unless your care team tells you to stop. In some cases, you may be given additional medications to help with side effects. Follow all directions for their use. This medication may increase your risk of getting an infection. Call your care team for advice if you get a fever, chills, sore throat, or other symptoms of a cold or flu. Do not treat yourself. Try to avoid being around people who are  sick. This medication may increase your risk to bruise or bleed. Call your care team if you notice any unusual bleeding. Be careful brushing or flossing your teeth or using a toothpick because you may get an infection or bleed more easily. If you have any dental work done, tell your dentist you are receiving this medication. Avoid taking medications that contain aspirin, acetaminophen, ibuprofen, naproxen, or ketoprofen unless instructed by your care team. These medications may hide a fever. Do not treat diarrhea with over the counter products. Contact your care team if you have diarrhea that lasts more than 2 days or if it is severe and watery. This medication can make you more sensitive to the sun. Keep out of the sun. If you cannot avoid being in the sun, wear protective clothing and sunscreen. Do not use sun lamps, tanning beds, or tanning booths. Talk to your care team if you or your partner wish to become pregnant or think you might be pregnant. This medication can cause serious birth defects if taken during pregnancy and for 3 months after the last dose. A reliable form of contraception is recommended while taking this medication and for 3 months after the last dose. Talk to your care team about effective forms of contraception. Do not father a child while taking this medication and for 3 months after the last dose. Use a condom while having sex during this time period. Do not breastfeed while taking this medication. This medication may cause infertility. Talk to your care team if you are concerned about your fertility. What side effects may I notice from receiving this medication? Side effects that you should report to your care team as soon as possible: Allergic reactions--skin rash, itching, hives, swelling of the face, lips, tongue, or throat Heart attack--pain or tightness in the chest, shoulders, arms, or jaw, nausea, shortness of breath, cold or clammy skin, feeling faint or  lightheaded Heart failure--shortness of breath, swelling of the ankles, feet, or hands, sudden weight gain, unusual weakness or fatigue Heart rhythm changes--fast or irregular heartbeat, dizziness, feeling faint or lightheaded, chest pain, trouble breathing High ammonia level--unusual weakness or fatigue, confusion, loss of appetite, nausea, vomiting, seizures Infection--fever, chills, cough, sore throat, wounds that don't heal, pain or trouble when passing urine, general feeling of discomfort or being unwell Low red blood cell level--unusual weakness or fatigue, dizziness, headache, trouble breathing Pain, tingling, or numbness in the hands or feet, muscle weakness, change in vision, confusion or trouble speaking, loss of balance or coordination, trouble walking, seizures Redness, swelling, and blistering of the skin over hands and feet Severe or prolonged diarrhea Unusual bruising or bleeding Side effects that usually do not require medical attention (report to your care  team if they continue or are bothersome): Dry skin Headache Increased tears Nausea Pain, redness, or swelling with sores inside the mouth or throat Sensitivity to light Vomiting This list may not describe all possible side effects. Call your doctor for medical advice about side effects. You may report side effects to FDA at 1-800-FDA-1088. Where should I keep my medication? This medication is given in a hospital or clinic. It will not be stored at home. NOTE: This sheet is a summary. It may not cover all possible information. If you have questions about this medicine, talk to your doctor, pharmacist, or health care provider.  2023 Elsevier/Gold Standard (2021-11-29 00:00:00)  The chemotherapy medication bag should finish at 46 hours, 96 hours, or 7 days. For example, if your pump is scheduled for 46 hours and it was put on at 4:00 p.m., it should finish at 2:00 p.m. the day it is scheduled to come off regardless of your  appointment time.     Estimated time to finish at 1:15 p.m. on Friday 09/22/2022.   If the display on your pump reads "Low Volume" and it is beeping, take the batteries out of the pump and come to the cancer center for it to be taken off.   If the pump alarms go off prior to the pump reading "Low Volume" then call [phone] and someone can assist you.  If the plunger comes out and the chemotherapy medication is leaking out, please use your home chemo spill kit to clean up the spill. Do NOT use paper towels or other household products.  If you have problems or questions regarding your pump, please call either [phone] (24 hours a day) or the cancer center Monday-Friday 8:00 a.m.- 4:30 p.m. at the clinic number and we will assist you. If you are unable to get assistance, then go to the nearest Emergency Department and ask the staff to contact the IV team for assistance.

## 2022-09-20 NOTE — Progress Notes (Signed)
Patient seen by Dr. Benay Spice today  Vitals are within treatment parameters.  Labs reviewed by Dr. Benay Spice and are not all within treatment parameters. ANC 1.4 today  (OK to tx)  Per physician team, patient is ready for treatment. Please note that modifications are being made to the treatment plan including Adding Udenyca to day 3

## 2022-09-20 NOTE — Progress Notes (Signed)
  [facility] OFFICE PROGRESS NOTE   Diagnosis: Colon cancer  INTERVAL HISTORY:   Joan Owen completed another cycle of FOLFOX on 09/06/2022.  No nausea/vomiting, mouth sores, or diarrhea.  She has cold sensitivity for approximately 4 days following chemotherapy.  She had an episode of chills and aching approximately 10 days following chemotherapy.  This was relieved with ibuprofen.  She continues to have intermittent rectal bleeding.  She is scheduled for sigmoidoscopy in 2 weeks.  Objective:  Vital signs in last 24 hours:  Blood pressure 122/68, pulse 71, temperature 98.2 F (36.8 C), temperature source Oral, resp. rate 18, height '5\' 4"'$  (1.626 m), weight 146 lb 3.2 oz (66.3 kg), last menstrual period 03/28/2013, SpO2 99 %.    HEENT: No thrush or ulcers Resp: Lungs clear bilaterally with decreased breath sounds at the right posterior base, no respiratory distress Cardio: Regular rate and rhythm GI: Nontender, no hepatosplenomegaly Vascular: No leg edema  Skin: Palms without erythema  Portacath/PICC-without erythema  Lab Results:  Lab Results  Component Value Date   WBC 2.5 (L) 09/20/2022   HGB 10.7 (L) 09/20/2022   HCT 31.8 (L) 09/20/2022   MCV 90.3 09/20/2022   PLT 130 (L) 09/20/2022   NEUTROABS 1.4 (L) 09/20/2022    CMP  Lab Results  Component Value Date   NA 140 09/20/2022   K 4.3 09/20/2022   CL 103 09/20/2022   CO2 29 09/20/2022   GLUCOSE 95 09/20/2022   BUN 16 09/20/2022   CREATININE 0.70 09/20/2022   CALCIUM 9.7 09/20/2022   PROT 7.0 09/20/2022   ALBUMIN 4.0 09/20/2022   AST 16 09/20/2022   ALT 21 09/20/2022   ALKPHOS 48 09/20/2022   BILITOT 0.8 09/20/2022   GFRNONAA >60 09/20/2022    Lab Results  Component Value Date   CEA 8.89 (H) 09/20/2022    Medications: I have reviewed the patient's current medications.   Assessment/Plan: Colon cancer, stage IIa (T3 N0 M0), sigmoidectomy 04/29/2021 Colonoscopy 03/24/2021-partially  obstructing mass in the sigmoid colon, biopsy-adenocarcinoma CTs 03/28/2021-sigmoid colon mass, prominent presacral and sigmoid mesentery lymph nodes-nonspecific, no evidence of distant metastatic disease, sclerotic lesions at the left intertrochanteric region-likely a bone island Sigmoidectomy 04/29/2021-sigmoid colon tumor, 0/16 lymph nodes, no lymphovascular or perineural invasion, no loss of mismatch repair protein expression, MSS Foundation 1-MSS, tumor mutation burden 6, K-ras and NRAS wild-type, ERBB2 amplification-equivocal HER2 2+, negative by FISH Colonoscopy 04/27/2022-polyps removed from the ascending and transverse colon-tubular adenomas 05/31/2022 CEA 8.54 06/28/2022 CEA 9.48 07/10/2022 CTs-new lesion central left hepatic lobe 07/16/2022 MRI 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 Seen by Dr. Fayrene Helper with recommendation for a 66-monthcourse of chemotherapy prior to surgery Cycle 1 FOLFOX 08/09/2022 Cycle 2 FOLFOX 08/23/2022 Cycle 3 FOLFOX 09/06/2022 Cycle 4 FOLFOX 09/20/2022  2.   Rectal bleeding, appointment with gastroenterology 09/25/2022    Disposition: Ms. STricaricohas completed 3 cycles of FOLFOX.  She has tolerated the chemotherapy well.  She has mild neutropenia today.  She will call for a fever or symptoms of infection.  The plan is to proceed with chemotherapy.  She will receive G-CSF on day 3.  We reviewed potential toxicities associated with G-CSF including rash, bone pain, and splenic rupture.  She agrees to proceed.  Ms. SSanderlinwill return for an office visit and chemotherapy in 2 weeks.  GBetsy Coder MD  09/20/2022  11:27 AM

## 2022-09-20 NOTE — Progress Notes (Signed)
Spoke with the patient on the phone regarding [facility] paper work that has not been received from September 04, 2022.  Patient notified I have not received any extra paper work from this date.  She stated the [facility] nurse was faxing the office notes from Janaury 24, 2024 and today's note- September 20, 2022 and she would call if she needed any more help.  Patient given the fax number to the [facility] if needed.

## 2022-09-22 ENCOUNTER — Inpatient Hospital Stay: Payer: Medicare Other

## 2022-09-22 VITALS — BP 138/72 | HR 73 | Temp 98.3°F | Resp 18

## 2022-09-22 DIAGNOSIS — Z5189 Encounter for other specified aftercare: Secondary | ICD-10-CM | POA: Diagnosis not present

## 2022-09-22 DIAGNOSIS — Z85038 Personal history of other malignant neoplasm of large intestine: Secondary | ICD-10-CM

## 2022-09-22 DIAGNOSIS — Z5111 Encounter for antineoplastic chemotherapy: Secondary | ICD-10-CM | POA: Diagnosis not present

## 2022-09-22 DIAGNOSIS — C187 Malignant neoplasm of sigmoid colon: Secondary | ICD-10-CM | POA: Diagnosis not present

## 2022-09-22 DIAGNOSIS — D709 Neutropenia, unspecified: Secondary | ICD-10-CM | POA: Diagnosis not present

## 2022-09-22 MED ORDER — PEGFILGRASTIM-CBQV 6 MG/0.6ML ~~LOC~~ SOSY
6.0000 mg | PREFILLED_SYRINGE | Freq: Once | SUBCUTANEOUS | Status: AC
  Administered 2022-09-22: 6 mg via SUBCUTANEOUS
  Filled 2022-09-22: qty 0.6

## 2022-09-22 MED ORDER — HEPARIN SOD (PORK) LOCK FLUSH 100 UNIT/ML IV SOLN
500.0000 [IU] | Freq: Once | INTRAVENOUS | Status: AC | PRN
  Administered 2022-09-22: 500 [IU]

## 2022-09-22 MED ORDER — SODIUM CHLORIDE 0.9% FLUSH
10.0000 mL | INTRAVENOUS | Status: DC | PRN
  Administered 2022-09-22: 10 mL

## 2022-09-22 NOTE — Patient Instructions (Signed)

## 2022-09-22 NOTE — Progress Notes (Signed)
Verbal education provided on Udenyca injection and side effects management. Patient verbalized understanding.

## 2022-09-25 ENCOUNTER — Ambulatory Visit: Payer: Medicare Other | Admitting: Gastroenterology

## 2022-10-01 ENCOUNTER — Other Ambulatory Visit: Payer: Self-pay | Admitting: Oncology

## 2022-10-01 DIAGNOSIS — Z85038 Personal history of other malignant neoplasm of large intestine: Secondary | ICD-10-CM

## 2022-10-03 ENCOUNTER — Ambulatory Visit (AMBULATORY_SURGERY_CENTER): Payer: Medicare Other | Admitting: Internal Medicine

## 2022-10-03 ENCOUNTER — Encounter: Payer: Self-pay | Admitting: Internal Medicine

## 2022-10-03 VITALS — BP 115/61 | HR 76 | Temp 97.7°F | Resp 11 | Ht 64.0 in | Wt 144.0 lb

## 2022-10-03 DIAGNOSIS — Z85038 Personal history of other malignant neoplasm of large intestine: Secondary | ICD-10-CM | POA: Diagnosis not present

## 2022-10-03 DIAGNOSIS — K625 Hemorrhage of anus and rectum: Secondary | ICD-10-CM | POA: Diagnosis not present

## 2022-10-03 DIAGNOSIS — Z08 Encounter for follow-up examination after completed treatment for malignant neoplasm: Secondary | ICD-10-CM

## 2022-10-03 MED ORDER — SODIUM CHLORIDE 0.9 % IV SOLN: 500.0000 mL | INTRAVENOUS | Status: DC

## 2022-10-03 NOTE — Progress Notes (Unsigned)
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  She remains appropriate for flexible sigmoidoscopy here today

## 2022-10-03 NOTE — Progress Notes (Signed)
Pt's states no medical or surgical changes since previsit or office visit. DT, NT obtained VS.

## 2022-10-03 NOTE — Patient Instructions (Signed)
Resume previous diet and medications.  Continue diltiazem gel twice a day 1 week after fissure symptoms have completely gone.  Resume PRN.  Colonoscopy at previously recommended interval - September 2026.    YOU HAD AN ENDOSCOPIC PROCEDURE TODAY AT [facility]:   Refer to the procedure report that was given to you for any specific questions about what was found during the examination.  If the procedure report does not answer your questions, please call your gastroenterologist to clarify.  If you requested that your care partner not be given the details of your procedure findings, then the procedure report has been included in a sealed envelope for you to review at your convenience later.  YOU SHOULD EXPECT: Some feelings of bloating in the abdomen. Passage of more gas than usual.  Walking can help get rid of the air that was put into your GI tract during the procedure and reduce the bloating. If you had a lower endoscopy (such as a colonoscopy or flexible sigmoidoscopy) you may notice spotting of blood in your stool or on the toilet paper. If you underwent a bowel prep for your procedure, you may not have a normal bowel movement for a few days.  Please Note:  You might notice some irritation and congestion in your nose or some drainage.  This is from the oxygen used during your procedure.  There is no need for concern and it should clear up in a day or so.  SYMPTOMS TO REPORT IMMEDIATELY:  Following lower endoscopy (colonoscopy or flexible sigmoidoscopy):  Excessive amounts of blood in the stool  Significant tenderness or worsening of abdominal pains  Swelling of the abdomen that is new, acute  Fever of 100F or higher   For urgent or emergent issues, a gastroenterologist can be reached at any hour by calling [phone]. Do not use MyChart messaging for urgent concerns.    DIET:  We do recommend a small meal at first, but then you may proceed to your regular diet.   Drink plenty of fluids but you should avoid alcoholic beverages for 24 hours.  ACTIVITY:  You should plan to take it easy for the rest of today and you should NOT DRIVE or use heavy machinery until tomorrow (because of the sedation medicines used during the test).    FOLLOW UP: Our staff will call the number listed on your records the next business day following your procedure.  We will call around 7:15- 8:00 am to check on you and address any questions or concerns that you may have regarding the information given to you following your procedure. If we do not reach you, we will leave a message.     If any biopsies were taken you will be contacted by phone or by letter within the next 1-3 weeks.  Please call us at [phone] if you have not heard about the biopsies in 3 weeks.    SIGNATURES/CONFIDENTIALITY: You and/or your care partner have signed paperwork which will be entered into your electronic medical record.  These signatures attest to the fact that that the information above on your After Visit Summary has been reviewed and is understood.  Full responsibility of the confidentiality of this discharge information lies with you and/or your care-partner.

## 2022-10-03 NOTE — Op Note (Signed)
[facility] Patient Name: Joan Owen Procedure Date: 10/03/2022 3:36 PM MRN: [id number] Endoscopist: Jerene Bears , MD, [id number] Age: 67 Referring MD:  Date of Birth: 02-28-1956 Gender: Female Account #: [id number] Procedure:                Flexible Sigmoidoscopy Indications:              Rectal hemorrhage in pt with personal history of                            colon cancer (sigmoid) s/p resection and currently                            undergoing chemotherapy for liver metastasis with                            plans for surgical resection in April 2024 at [facility]:                Monitored Anesthesia Care Procedure:                Pre-Anesthesia Assessment:                           - Prior to the procedure, a History and Physical                            was performed, and patient medications and                            allergies were reviewed. The patient's tolerance of                            previous anesthesia was also reviewed. The risks                            and benefits of the procedure and the sedation                            options and risks were discussed with the patient.                            All questions were answered, and informed consent                            was obtained. Prior Anticoagulants: The patient has                            taken no anticoagulant or antiplatelet agents. ASA                            Grade Assessment: III - A patient with severe                            systemic disease. After reviewing the risks and  benefits, the patient was deemed in satisfactory                            condition to undergo the procedure.                           After obtaining informed consent, the scope was                            passed under direct vision. The Olympus PCF-H190DL                            [id number]) Colonoscope was introduced through the                             anus and advanced to the splenic flexure. The                            flexible sigmoidoscopy was accomplished without                            difficulty. The patient tolerated the procedure                            well. The quality of the bowel preparation was good. Scope In: 3:50:51 PM Scope Out: [id number] PM Total Procedure Duration: 0 hours 5 minutes 17 seconds  Findings:                 Skin tags were found on perianal exam.                           Multiple medium-mouthed and small-mouthed                            diverticula were found in the sigmoid colon and                            descending colon.                           There was evidence of a prior end-to-side                            colo-colonic anastomosis in the sigmoid colon. This                            was patent and was characterized by healthy                            appearing mucosa and visible sutures.                           Anal papilla(e) were hypertrophied.  No additional abnormalities were found on                            retroflexion. Complications:            No immediate complications. Estimated Blood Loss:     Estimated blood loss: none. Impression:               - Moderate diverticulosis in the sigmoid colon and                            in the descending colon.                           - Patent end-to-side colo-colonic anastomosis,                            characterized by healthy appearing mucosa and                            visible sutures.                           - Anal papillae were hypertrophied on retroflexed                            views in the rectum.                           - Rectal bleeding (no resolved) consistent with                            healing fissure.                           - No specimens collected. Recommendation:           - Patient has a contact number available for                            emergencies. The  signs and symptoms of potential                            delayed complications were discussed with the                            patient. Return to normal activities tomorrow.                            Written discharge instructions were provided to the                            patient.                           - Resume previous diet.                           -  Continue diltiazem gel BID 1 week after fissure                            symptoms completely gone. Resume PRN.                           - Colonoscopy at previously recommended interval,                            Sept 2026. Jerene Bears, MD 10/03/2022 4:03:09 PM This report has been signed electronically.

## 2022-10-03 NOTE — Progress Notes (Unsigned)
To pacu, VSS. Report to [email or web address].tb 

## 2022-10-04 ENCOUNTER — Inpatient Hospital Stay: Payer: Medicare Other | Admitting: Nurse Practitioner

## 2022-10-04 ENCOUNTER — Telehealth: Payer: Self-pay

## 2022-10-04 ENCOUNTER — Inpatient Hospital Stay: Payer: Medicare Other

## 2022-10-04 ENCOUNTER — Encounter: Payer: Self-pay | Admitting: Nurse Practitioner

## 2022-10-04 DIAGNOSIS — C187 Malignant neoplasm of sigmoid colon: Secondary | ICD-10-CM | POA: Diagnosis not present

## 2022-10-04 DIAGNOSIS — Z5111 Encounter for antineoplastic chemotherapy: Secondary | ICD-10-CM | POA: Diagnosis not present

## 2022-10-04 DIAGNOSIS — D709 Neutropenia, unspecified: Secondary | ICD-10-CM | POA: Diagnosis not present

## 2022-10-04 DIAGNOSIS — Z5189 Encounter for other specified aftercare: Secondary | ICD-10-CM | POA: Diagnosis not present

## 2022-10-04 DIAGNOSIS — Z85038 Personal history of other malignant neoplasm of large intestine: Secondary | ICD-10-CM

## 2022-10-04 DIAGNOSIS — Z95828 Presence of other vascular implants and grafts: Secondary | ICD-10-CM

## 2022-10-04 LAB — CEA (ACCESS): CEA (CHCC): 10 ng/mL — ABNORMAL HIGH (ref 0.00–5.00)

## 2022-10-04 LAB — CMP (CANCER CENTER ONLY)
ALT: 81 U/L — ABNORMAL HIGH (ref 0–44)
AST: 44 U/L — ABNORMAL HIGH (ref 15–41)
Albumin: 4 g/dL (ref 3.5–5.0)
Alkaline Phosphatase: 83 U/L (ref 38–126)
Anion gap: 6 (ref 5–15)
BUN: 14 mg/dL (ref 8–23)
CO2: 29 mmol/L (ref 22–32)
Calcium: 9.5 mg/dL (ref 8.9–10.3)
Chloride: 105 mmol/L (ref 98–111)
Creatinine: 0.69 mg/dL (ref 0.44–1.00)
GFR, Estimated: >60 mL/min (ref >60)
Glucose, Bld: 134 mg/dL — ABNORMAL HIGH (ref 70–99)
Potassium: 3.6 mmol/L (ref 3.5–5.1)
Sodium: 140 mmol/L (ref 135–145)
Total Bilirubin: 0.5 mg/dL (ref 0.3–1.2)
Total Protein: 6.6 g/dL (ref 6.5–8.1)

## 2022-10-04 LAB — CBC WITH DIFFERENTIAL (CANCER CENTER ONLY)
Abs Immature Granulocytes: 0.17 10*3/uL — ABNORMAL HIGH (ref 0.00–0.07)
Basophils Absolute: 0 10*3/uL (ref 0.0–0.1)
Basophils Relative: 1 %
Eosinophils Absolute: 0 10*3/uL (ref 0.0–0.5)
Eosinophils Relative: 1 %
HCT: 32.9 % — ABNORMAL LOW (ref 36.0–46.0)
Hemoglobin: 11.2 g/dL — ABNORMAL LOW (ref 12.0–15.0)
Immature Granulocytes: 3 %
Lymphocytes Relative: 17 %
Lymphs Abs: 1.1 10*3/uL (ref 0.7–4.0)
MCH: 31.5 pg (ref 26.0–34.0)
MCHC: 34 g/dL (ref 30.0–36.0)
MCV: 92.4 fL (ref 80.0–100.0)
Monocytes Absolute: 0.5 10*3/uL (ref 0.1–1.0)
Monocytes Relative: 7 %
Neutro Abs: 4.8 10*3/uL (ref 1.7–7.7)
Neutrophils Relative %: 71 %
Platelet Count: 128 10*3/uL — ABNORMAL LOW (ref 150–400)
RBC: 3.56 MIL/uL — ABNORMAL LOW (ref 3.87–5.11)
RDW: 15.7 % — ABNORMAL HIGH (ref 11.5–15.5)
WBC Count: 6.6 10*3/uL (ref 4.0–10.5)
nRBC: 0 % (ref 0.0–0.2)

## 2022-10-04 MED ORDER — FLUOROURACIL CHEMO INJECTION 2.5 GM/50ML
400.0000 mg/m2 | Freq: Once | INTRAVENOUS | Status: AC
  Administered 2022-10-04: 700 mg via INTRAVENOUS
  Filled 2022-10-04: qty 14

## 2022-10-04 MED ORDER — SODIUM CHLORIDE 0.9 % IV SOLN
2400.0000 mg/m2 | INTRAVENOUS | Status: DC
  Administered 2022-10-04: 4150 mg via INTRAVENOUS
  Filled 2022-10-04: qty 83

## 2022-10-04 MED ORDER — DEXTROSE 5 % IV SOLN: Freq: Once | INTRAVENOUS | Status: AC

## 2022-10-04 MED ORDER — PALONOSETRON HCL INJECTION 0.25 MG/5ML
0.2500 mg | Freq: Once | INTRAVENOUS | Status: AC
  Administered 2022-10-04: 0.25 mg via INTRAVENOUS
  Filled 2022-10-04: qty 5

## 2022-10-04 MED ORDER — OXALIPLATIN CHEMO INJECTION 100 MG/20ML
85.0000 mg/m2 | Freq: Once | INTRAVENOUS | Status: AC
  Administered 2022-10-04: 150 mg via INTRAVENOUS
  Filled 2022-10-04: qty 20

## 2022-10-04 MED ORDER — SODIUM CHLORIDE 0.9 % IV SOLN
10.0000 mg | Freq: Once | INTRAVENOUS | Status: AC
  Administered 2022-10-04: 10 mg via INTRAVENOUS
  Filled 2022-10-04: qty 1

## 2022-10-04 MED ORDER — SODIUM CHLORIDE 0.9% FLUSH
10.0000 mL | INTRAVENOUS | Status: DC | PRN
  Administered 2022-10-04: 10 mL via INTRAVENOUS

## 2022-10-04 MED ORDER — LEUCOVORIN CALCIUM INJECTION 350 MG
400.0000 mg/m2 | Freq: Once | INTRAVENOUS | Status: AC
  Administered 2022-10-04: 692 mg via INTRAVENOUS
  Filled 2022-10-04: qty 34.6

## 2022-10-04 NOTE — Progress Notes (Signed)
  [facility] OFFICE PROGRESS NOTE   Diagnosis: Colon cancer  INTERVAL HISTORY:   Joan Owen returns as scheduled.  She completed cycle 4 FOLFOX 09/20/2022.  She had very mild nausea.  No vomiting.  No mouth sores.  No diarrhea.  Cold sensitivity lasted 4 to 5 days.  No persistent neuropathy symptoms.  Mild fatigue.  No further rectal bleeding.  Reports she has nearly completed treatment for an anal fissure.  Objective:  Vital signs in last 24 hours:  Blood pressure 105/62, pulse 73, temperature 98.2 F (36.8 C), temperature source Tympanic, resp. rate 18, weight 142 lb 12.8 oz (64.8 kg), last menstrual period 03/28/2013, SpO2 100 %.    HEENT: No thrush or ulcers. Resp: Lungs clear bilaterally. Cardio: Regular rate and rhythm. GI: Abdomen soft and nontender.  No hepatosplenomegaly. Vascular: No leg edema. Skin: Palms without erythema.  Skin is dry in general. Port-A-Cath without erythema.   Lab Results:  Lab Results  Component Value Date   WBC 6.6 10/04/2022   HGB 11.2 (L) 10/04/2022   HCT 32.9 (L) 10/04/2022   MCV 92.4 10/04/2022   PLT 128 (L) 10/04/2022   NEUTROABS 4.8 10/04/2022    Imaging:  No results found.  Medications: I have reviewed the patient's current medications.  Assessment/Plan: Colon cancer, stage IIa (T3 N0 M0), sigmoidectomy 04/29/2021 Colonoscopy 03/24/2021-partially obstructing mass in the sigmoid colon, biopsy-adenocarcinoma CTs 03/28/2021-sigmoid colon mass, prominent presacral and sigmoid mesentery lymph nodes-nonspecific, no evidence of distant metastatic disease, sclerotic lesions at the left intertrochanteric region-likely a bone island Sigmoidectomy 04/29/2021-sigmoid colon tumor, 0/16 lymph nodes, no lymphovascular or perineural invasion, no loss of mismatch repair protein expression, MSS Foundation 1-MSS, tumor mutation burden 6, K-ras and NRAS wild-type, ERBB2 amplification-equivocal HER2 2+, negative by FISH Colonoscopy  04/27/2022-polyps removed from the ascending and transverse colon-tubular adenomas 05/31/2022 CEA 8.54 06/28/2022 CEA 9.48 07/10/2022 CTs-new lesion central left hepatic lobe 07/16/2022 MRI 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 Seen by Dr. Fayrene Helper with recommendation for a 23-monthcourse of chemotherapy prior to surgery Cycle 1 FOLFOX 08/09/2022 Cycle 2 FOLFOX 08/23/2022 Cycle 3 FOLFOX 09/06/2022 Cycle 4 FOLFOX 09/20/2022 Cycle 5 FOLFOX 10/04/2022   2.   Rectal bleeding, appointment with gastroenterology 09/25/2022-anal fissure per GI exam 09/11/2022.  Completed treatment for an anal fissure.  Flexible sigmoidoscopy 10/03/2022-moderate diverticulosis in the sigmoid colon and in the descending colon; patent end-to-side colocolonic anastomosis; anal papilla hypertrophied.  Rectal bleeding felt to be due to anal fissure.  Disposition: Joan Owen appears stable.  She has completed 4 cycles of FOLFOX.  She continues to tolerate chemotherapy well.  Plan to proceed with cycle 5 today as scheduled.  CBC and chemistry panel reviewed.  Labs adequate to proceed with treatment.  Transaminases mildly elevated.  We will continue to monitor.  She will return for lab, follow-up, cycle 6 FOLFOX in 2 weeks.  We are available to see her sooner if needed.    LNed CardANP/GNP-BC   10/04/2022  10:09 AM

## 2022-10-04 NOTE — Telephone Encounter (Signed)
  Follow up Call-     10/03/2022    2:43 PM 04/27/2022    8:22 AM 03/24/2021   12:45 PM  Call back number  Post procedure Call Back phone  # [phone]  Permission to leave phone message Yes Yes Yes     Patient questions:  Do you have a fever, pain , or abdominal swelling? No. Pain Score  0 *  Have you tolerated food without any problems? Yes.    Have you been able to return to your normal activities? Yes.    Do you have any questions about your discharge instructions: Diet   No. Medications  No. Follow up visit  No.  Do you have questions or concerns about your Care? No.  Actions: * If pain score is 4 or above: No action needed, pain <4.

## 2022-10-04 NOTE — Patient Instructions (Signed)
[facility]  Discharge Instructions: Thank you for choosing [facility] to provide your oncology and hematology care.   If you have a lab appointment with the [facility], please go directly to the [facility] and check in at the registration area.   Wear comfortable clothing and clothing appropriate for easy access to any Portacath or PICC line.   We strive to give you quality time with your provider. You may need to reschedule your appointment if you arrive late (15 or more minutes).  Arriving late affects you and other patients whose appointments are after yours.  Also, if you miss three or more appointments without notifying the office, you may be dismissed from the clinic at the provider's discretion.      For prescription refill requests, have your pharmacy contact our office and allow 72 hours for refills to be completed.    Today you received the following chemotherapy and/or immunotherapy agents Oxaliplatin, Leucovorin and Adrucil       To help prevent nausea and vomiting after your treatment, we encourage you to take your nausea medication as directed.  BELOW ARE SYMPTOMS THAT SHOULD BE REPORTED IMMEDIATELY: *FEVER GREATER THAN 100.4 F (38 C) OR HIGHER *CHILLS OR SWEATING *NAUSEA AND VOMITING THAT IS NOT CONTROLLED WITH YOUR NAUSEA MEDICATION *UNUSUAL SHORTNESS OF BREATH *UNUSUAL BRUISING OR BLEEDING *URINARY PROBLEMS (pain or burning when urinating, or frequent urination) *BOWEL PROBLEMS (unusual diarrhea, constipation, pain near the anus) TENDERNESS IN MOUTH AND THROAT WITH OR WITHOUT PRESENCE OF ULCERS (sore throat, sores in mouth, or a toothache) UNUSUAL RASH, SWELLING OR PAIN  UNUSUAL VAGINAL DISCHARGE OR ITCHING   Items with * indicate a potential emergency and should be followed up as soon as possible or go to the Emergency Department if any problems should occur.  Please show the CHEMOTHERAPY ALERT CARD or  IMMUNOTHERAPY ALERT CARD at check-in to the Emergency Department and triage nurse.  Should you have questions after your visit or need to cancel or reschedule your appointment, please contact [facility]  Dept: [phone]  and follow the prompts.  Office hours are 8:00 a.m. to 4:30 p.m. Monday - Friday. Please note that voicemails left after 4:00 p.m. may not be returned until the following business day.  We are closed weekends and major holidays. You have access to a nurse at all times for urgent questions. Please call the main number to the clinic Dept: [phone] and follow the prompts.   For any non-urgent questions, you may also contact your provider using MyChart. We now offer e-Visits for anyone 68 and older to request care online for non-urgent symptoms. For details visit [email or web address].   Also download the MyChart app! Go to the app store, search "MyChart", open the app, select , and log in with your MyChart username and password.

## 2022-10-06 ENCOUNTER — Encounter: Payer: Self-pay | Admitting: *Deleted

## 2022-10-06 ENCOUNTER — Inpatient Hospital Stay: Payer: Medicare Other

## 2022-10-06 VITALS — BP 116/69 | HR 72 | Temp 98.2°F | Resp 18

## 2022-10-06 DIAGNOSIS — Z5111 Encounter for antineoplastic chemotherapy: Secondary | ICD-10-CM | POA: Diagnosis not present

## 2022-10-06 DIAGNOSIS — Z5189 Encounter for other specified aftercare: Secondary | ICD-10-CM | POA: Diagnosis not present

## 2022-10-06 DIAGNOSIS — D709 Neutropenia, unspecified: Secondary | ICD-10-CM | POA: Diagnosis not present

## 2022-10-06 DIAGNOSIS — C187 Malignant neoplasm of sigmoid colon: Secondary | ICD-10-CM | POA: Diagnosis not present

## 2022-10-06 DIAGNOSIS — Z85038 Personal history of other malignant neoplasm of large intestine: Secondary | ICD-10-CM

## 2022-10-06 MED ORDER — HEPARIN SOD (PORK) LOCK FLUSH 100 UNIT/ML IV SOLN
500.0000 [IU] | Freq: Once | INTRAVENOUS | Status: AC | PRN
  Administered 2022-10-06: 500 [IU]

## 2022-10-06 MED ORDER — SODIUM CHLORIDE 0.9% FLUSH
10.0000 mL | INTRAVENOUS | Status: DC | PRN
  Administered 2022-10-06: 10 mL

## 2022-10-06 MED ORDER — PEGFILGRASTIM-CBQV 6 MG/0.6ML ~~LOC~~ SOSY
6.0000 mg | PREFILLED_SYRINGE | Freq: Once | SUBCUTANEOUS | Status: AC
  Administered 2022-10-06: 6 mg via SUBCUTANEOUS
  Filled 2022-10-06: qty 0.6

## 2022-10-06 NOTE — Progress Notes (Signed)
Faxed recent office note/labs to [facility] at [phone] per patient request.

## 2022-10-06 NOTE — Patient Instructions (Signed)
Implanted Port Home Guide An implanted port is a device that is placed under the skin. It is usually placed in the chest. The device may vary based on the need. Implanted ports can be used to give IV medicine, to take blood, or to give fluids. You may have an implanted port if: You need IV medicine that would be irritating to the small veins in your hands or arms. You need IV medicines, such as chemotherapy, for a long period of time. You need IV nutrition for a long period of time. You may have fewer limitations when using a port than you would if you used other types of long-term IVs. You will also likely be able to return to normal activities after your incision heals. An implanted port has two main parts: Reservoir. The reservoir is the part where a needle is inserted to give medicines or draw blood. The reservoir is round. After the port is placed, it appears as a small, raised area under your skin. Catheter. The catheter is a small, thin tube that connects the reservoir to a vein. Medicine that is inserted into the reservoir goes into the catheter and then into the vein. How is my port accessed? To access your port: A numbing cream may be placed on the skin over the port site. Your health care provider will put on a mask and sterile gloves. The skin over your port will be cleaned carefully with a germ-killing soap and allowed to dry. Your health care provider will gently pinch the port and insert a needle into it. Your health care provider will check for a blood return to make sure the port is in the vein and is still working (patent). If your port needs to remain accessed to get medicine continuously (constant infusion), your health care provider will place a clear bandage (dressing) over the needle site. The dressing and needle will need to be changed every week, or as told by your health care provider. What is flushing? Flushing helps keep the port working. Follow instructions from your  health care provider about how and when to flush the port. Ports are usually flushed with saline solution or a medicine called heparin. The need for flushing will depend on how the port is used: If the port is only used from time to time to give medicines or draw blood, the port may need to be flushed: Before and after medicines have been given. Before and after blood has been drawn. As part of routine maintenance. Flushing may be recommended every 4-6 weeks. If a constant infusion is running, the port may not need to be flushed. Throw away any syringes in a disposal container that is meant for sharp items (sharps container). You can buy a sharps container from a pharmacy, or you can make one by using an empty hard plastic bottle with a cover. How long will my port stay implanted? The port can stay in for as long as your health care provider thinks it is needed. When it is time for the port to come out, a surgery will be done to remove it. The surgery will be similar to the procedure that was done to put the port in. Follow these instructions at home: Caring for your port and port site Flush your port as told by your health care provider. If you need an infusion over several days, follow instructions from your health care provider about how to take care of your port site. Make sure you: Change your   dressing as told by your health care provider. Wash your hands with soap and water for at least 20 seconds before and after you change your dressing. If soap and water are not available, use alcohol-based hand sanitizer. Place any used dressings or infusion bags into a plastic bag. Throw that bag in the trash. Keep the dressing that covers the needle clean and dry. Do not get it wet. Do not use scissors or sharp objects near the infusion tubing. Keep any external tubes clamped, unless they are being used. Check your port site every day for signs of infection. Check for: Redness, swelling, or  pain. Fluid or blood. Warmth. Pus or a bad smell. Protect the skin around the port site. Avoid wearing bra straps that rub or irritate the site. Protect the skin around your port from seat belts. Place a soft pad over your chest if needed. Bathe or shower as told by your health care provider. The site may get wet as long as you are not actively receiving an infusion. General instructions  Return to your normal activities as told by your health care provider. Ask your health care provider what activities are safe for you. Carry a medical alert card or wear a medical alert bracelet at all times. This will let health care providers know that you have an implanted port in case of an emergency. Where to find more information American Cancer Society: www.cancer.org American Society of Clinical Oncology: www.cancer.net Contact a health care provider if: You have a fever or chills. You have redness, swelling, or pain at the port site. You have fluid or blood coming from your port site. Your incision feels warm to the touch. You have pus or a bad smell coming from the port site. Summary Implanted ports are usually placed in the chest for long-term IV access. Follow instructions from your health care provider about flushing the port and changing bandages (dressings). Take care of the area around your port by avoiding clothing that puts pressure on the area, and by watching for signs of infection. Protect the skin around your port from seat belts. Place a soft pad over your chest if needed. Contact a health care provider if you have a fever or you have redness, swelling, pain, fluid, or a bad smell at the port site. This information is not intended to replace advice given to you by your health care provider. Make sure you discuss any questions you have with your health care provider. Document Revised: 02/01/2021 Document Reviewed: 02/01/2021 Elsevier Patient Education  2023 [employer]  Pegfilgrastim Injection What is this medication? PEGFILGRASTIM (PEG fil gra stim) lowers the risk of infection in people who are receiving chemotherapy. It works by helping your body make more white blood cells, which protects your body from infection. It may also be used to help people who have been exposed to high doses of radiation. This medicine may be used for other purposes; ask your health care provider or pharmacist if you have questions. COMMON BRAND NAME(S): Fulphila, Fylnetra, Neulasta, Nyvepria, Stimufend, UDENYCA, Ziextenzo What should I tell my care team before I take this medication? They need to know if you have any of these conditions: Kidney disease Latex allergy Ongoing radiation therapy Sickle cell disease Skin reactions to acrylic adhesives (On-Body Injector only) An unusual or allergic reaction to pegfilgrastim, filgrastim, other medications, foods, dyes, or preservatives Pregnant or trying to get pregnant Breast-feeding How should I use this medication? This medication is for injection under the   skin. If you get this medication at home, you will be taught how to prepare and give the pre-filled syringe or how to use the On-body Injector. Refer to the patient Instructions for Use for detailed instructions. Use exactly as directed. Tell your care team immediately if you suspect that the On-body Injector may not have performed as intended or if you suspect the use of the On-body Injector resulted in a missed or partial dose. It is important that you put your used needles and syringes in a special sharps container. Do not put them in a trash can. If you do not have a sharps container, call your pharmacist or care team to get one. Talk to your care team about the use of this medication in children. While this medication may be prescribed for selected conditions, precautions do apply. Overdosage: If you think you have taken too much of this medicine contact a poison control  center or emergency room at once. NOTE: This medicine is only for you. Do not share this medicine with others. What if I miss a dose? It is important not to miss your dose. Call your care team if you miss your dose. If you miss a dose due to an On-body Injector failure or leakage, a new dose should be administered as soon as possible using a single prefilled syringe for manual use. What may interact with this medication? Interactions have not been studied. This list may not describe all possible interactions. Give your health care provider a list of all the medicines, herbs, non-prescription drugs, or dietary supplements you use. Also tell them if you smoke, drink alcohol, or use illegal drugs. Some items may interact with your medicine. What should I watch for while using this medication? Your condition will be monitored carefully while you are receiving this medication. You may need blood work done while you are taking this medication. Talk to your care team about your risk of cancer. You may be more at risk for certain types of cancer if you take this medication. If you are going to need a MRI, CT scan, or other procedure, tell your care team that you are using this medication (On-Body Injector only). What side effects may I notice from receiving this medication? Side effects that you should report to your care team as soon as possible: Allergic reactions--skin rash, itching, hives, swelling of the face, lips, tongue, or throat Capillary leak syndrome--stomach or muscle pain, unusual weakness or fatigue, feeling faint or lightheaded, decrease in the amount of urine, swelling of the ankles, hands, or feet, trouble breathing High white blood cell level--fever, fatigue, trouble breathing, night sweats, change in vision, weight loss Inflammation of the aorta--fever, fatigue, back, chest, or stomach pain, severe headache Kidney injury (glomerulonephritis)--decrease in the amount of urine, red or dark  brown urine, foamy or bubbly urine, swelling of the ankles, hands, or feet Shortness of breath or trouble breathing Spleen injury--pain in upper left stomach or shoulder Unusual bruising or bleeding Side effects that usually do not require medical attention (report to your care team if they continue or are bothersome): Bone pain Pain in the hands or feet This list may not describe all possible side effects. Call your doctor for medical advice about side effects. You may report side effects to FDA at 1-800-FDA-1088. Where should I keep my medication? Keep out of the reach of children. If you are using this medication at home, you will be instructed on how to store it. Throw away any unused   medication after the expiration date on the label. NOTE: This sheet is a summary. It may not cover all possible information. If you have questions about this medicine, talk to your doctor, pharmacist, or health care provider.  2023 Elsevier/Gold Standard (2021-04-15 00:00:00)

## 2022-10-14 ENCOUNTER — Other Ambulatory Visit: Payer: Self-pay | Admitting: Oncology

## 2022-10-18 ENCOUNTER — Inpatient Hospital Stay: Payer: Medicare Other

## 2022-10-18 ENCOUNTER — Inpatient Hospital Stay: Payer: Medicare Other | Admitting: Nurse Practitioner

## 2022-10-18 ENCOUNTER — Inpatient Hospital Stay: Payer: Medicare Other | Attending: Oncology

## 2022-10-18 ENCOUNTER — Encounter: Payer: Self-pay | Admitting: Nurse Practitioner

## 2022-10-18 VITALS — BP 134/78 | HR 69

## 2022-10-18 DIAGNOSIS — Z5111 Encounter for antineoplastic chemotherapy: Secondary | ICD-10-CM | POA: Diagnosis present

## 2022-10-18 DIAGNOSIS — Z85038 Personal history of other malignant neoplasm of large intestine: Secondary | ICD-10-CM

## 2022-10-18 DIAGNOSIS — Z5189 Encounter for other specified aftercare: Secondary | ICD-10-CM | POA: Diagnosis not present

## 2022-10-18 DIAGNOSIS — C187 Malignant neoplasm of sigmoid colon: Secondary | ICD-10-CM | POA: Insufficient documentation

## 2022-10-18 LAB — CBC WITH DIFFERENTIAL (CANCER CENTER ONLY)
Abs Immature Granulocytes: 0.2 10*3/uL — ABNORMAL HIGH (ref 0.00–0.07)
Band Neutrophils: 10 %
Basophils Absolute: 0 10*3/uL (ref 0.0–0.1)
Basophils Relative: 0 %
Eosinophils Absolute: 0.1 10*3/uL (ref 0.0–0.5)
Eosinophils Relative: 1 %
HCT: 34 % — ABNORMAL LOW (ref 36.0–46.0)
Hemoglobin: 11.3 g/dL — ABNORMAL LOW (ref 12.0–15.0)
Lymphocytes Relative: 22 %
Lymphs Abs: 2.4 10*3/uL (ref 0.7–4.0)
MCH: 31.3 pg (ref 26.0–34.0)
MCHC: 33.2 g/dL (ref 30.0–36.0)
MCV: 94.2 fL (ref 80.0–100.0)
Metamyelocytes Relative: 2 %
Monocytes Absolute: 0.8 10*3/uL (ref 0.1–1.0)
Monocytes Relative: 7 %
Neutro Abs: 7.5 10*3/uL (ref 1.7–7.7)
Neutrophils Relative %: 58 %
Platelet Count: 140 10*3/uL — ABNORMAL LOW (ref 150–400)
RBC: 3.61 MIL/uL — ABNORMAL LOW (ref 3.87–5.11)
RDW: 17.2 % — ABNORMAL HIGH (ref 11.5–15.5)
WBC Count: 11.1 10*3/uL — ABNORMAL HIGH (ref 4.0–10.5)
nRBC: 0 % (ref 0.0–0.2)

## 2022-10-18 LAB — CMP (CANCER CENTER ONLY)
ALT: 58 U/L — ABNORMAL HIGH (ref 0–44)
AST: 42 U/L — ABNORMAL HIGH (ref 15–41)
Albumin: 4.1 g/dL (ref 3.5–5.0)
Alkaline Phosphatase: 116 U/L (ref 38–126)
Anion gap: 8 (ref 5–15)
BUN: 16 mg/dL (ref 8–23)
CO2: 28 mmol/L (ref 22–32)
Calcium: 9.8 mg/dL (ref 8.9–10.3)
Chloride: 104 mmol/L (ref 98–111)
Creatinine: 0.72 mg/dL (ref 0.44–1.00)
GFR, Estimated: >60 mL/min (ref >60)
Glucose, Bld: 101 mg/dL — ABNORMAL HIGH (ref 70–99)
Potassium: 4 mmol/L (ref 3.5–5.1)
Sodium: 140 mmol/L (ref 135–145)
Total Bilirubin: 0.6 mg/dL (ref 0.3–1.2)
Total Protein: 7.1 g/dL (ref 6.5–8.1)

## 2022-10-18 LAB — CEA (ACCESS): CEA (CHCC): 6.01 ng/mL — ABNORMAL HIGH (ref 0.00–5.00)

## 2022-10-18 MED ORDER — FLUOROURACIL CHEMO INJECTION 2.5 GM/50ML
400.0000 mg/m2 | Freq: Once | INTRAVENOUS | Status: AC
  Administered 2022-10-18: 700 mg via INTRAVENOUS
  Filled 2022-10-18: qty 14

## 2022-10-18 MED ORDER — PALONOSETRON HCL INJECTION 0.25 MG/5ML
0.2500 mg | Freq: Once | INTRAVENOUS | Status: AC
  Administered 2022-10-18: 0.25 mg via INTRAVENOUS
  Filled 2022-10-18: qty 5

## 2022-10-18 MED ORDER — OXALIPLATIN CHEMO INJECTION 100 MG/20ML
85.0000 mg/m2 | Freq: Once | INTRAVENOUS | Status: AC
  Administered 2022-10-18: 150 mg via INTRAVENOUS
  Filled 2022-10-18: qty 20

## 2022-10-18 MED ORDER — DEXTROSE 5 % IV SOLN: Freq: Once | INTRAVENOUS | Status: AC

## 2022-10-18 MED ORDER — LEUCOVORIN CALCIUM INJECTION 350 MG
400.0000 mg/m2 | Freq: Once | INTRAVENOUS | Status: AC
  Administered 2022-10-18: 692 mg via INTRAVENOUS
  Filled 2022-10-18: qty 34.6

## 2022-10-18 MED ORDER — SODIUM CHLORIDE 0.9 % IV SOLN
2400.0000 mg/m2 | INTRAVENOUS | Status: DC
  Administered 2022-10-18: 4150 mg via INTRAVENOUS
  Filled 2022-10-18: qty 83

## 2022-10-18 MED ORDER — SODIUM CHLORIDE 0.9 % IV SOLN
10.0000 mg | Freq: Once | INTRAVENOUS | Status: AC
  Administered 2022-10-18: 10 mg via INTRAVENOUS
  Filled 2022-10-18: qty 1

## 2022-10-18 NOTE — Patient Instructions (Addendum)
[employer]   The chemotherapy medication bag should finish at 46 hours, 96 hours, or 7 days. For example, if your pump is scheduled for 46 hours and it was put on at 4:00 p.m., it should finish at 2:00 p.m. the day it is scheduled to come off regardless of your appointment time.     Estimated time to finish at 1:30 Friday October 20, 2022.   If the display on your pump reads "Low Volume" and it is beeping, take the batteries out of the pump and come to the cancer center for it to be taken off.   If the pump alarms go off prior to the pump reading "Low Volume" then call [phone] and someone can assist you.  If the plunger comes out and the chemotherapy medication is leaking out, please use your home chemo spill kit to clean up the spill. Do NOT use paper towels or other household products.  If you have problems or questions regarding your pump, please call either [phone] (24 hours a day) or the cancer center Monday-Friday 8:00 a.m.- 4:30 p.m. at the clinic number and we will assist you. If you are unable to get assistance, then go to the nearest Emergency Department and ask the staff to contact the IV team for assistance.  Discharge Instructions: Thank you for choosing [facility] to provide your oncology and hematology care.   If you have a lab appointment with the [facility], please go directly to the [facility] and check in at the registration area.   Wear comfortable clothing and clothing appropriate for easy access to any Portacath or PICC line.   We strive to give you quality time with your provider. You may need to reschedule your appointment if you arrive late (15 or more minutes).  Arriving late affects you and other patients whose appointments are after yours.  Also, if you miss three or more appointments without notifying the office, you may be dismissed from the clinic at the provider's discretion.      For  prescription refill requests, have your pharmacy contact our office and allow 72 hours for refills to be completed.    Today you received the following chemotherapy and/or immunotherapy agents Oxaliplatin, Leucovorin, Fluorouracil.      To help prevent nausea and vomiting after your treatment, we encourage you to take your nausea medication as directed.  BELOW ARE SYMPTOMS THAT SHOULD BE REPORTED IMMEDIATELY: *FEVER GREATER THAN 100.4 F (38 C) OR HIGHER *CHILLS OR SWEATING *NAUSEA AND VOMITING THAT IS NOT CONTROLLED WITH YOUR NAUSEA MEDICATION *UNUSUAL SHORTNESS OF BREATH *UNUSUAL BRUISING OR BLEEDING *URINARY PROBLEMS (pain or burning when urinating, or frequent urination) *BOWEL PROBLEMS (unusual diarrhea, constipation, pain near the anus) TENDERNESS IN MOUTH AND THROAT WITH OR WITHOUT PRESENCE OF ULCERS (sore throat, sores in mouth, or a toothache) UNUSUAL RASH, SWELLING OR PAIN  UNUSUAL VAGINAL DISCHARGE OR ITCHING   Items with * indicate a potential emergency and should be followed up as soon as possible or go to the Emergency Department if any problems should occur.  Please show the CHEMOTHERAPY ALERT CARD or IMMUNOTHERAPY ALERT CARD at check-in to the Emergency Department and triage nurse.  Should you have questions after your visit or need to cancel or reschedule your appointment, please contact [facility]  Dept: [phone]  and follow the prompts.  Office hours are 8:00 a.m. to 4:30 p.m. Monday - Friday. Please note  that voicemails left after 4:00 p.m. may not be returned until the following business day.  We are closed weekends and major holidays. You have access to a nurse at all times for urgent questions. Please call the main number to the clinic Dept: [phone] and follow the prompts.   For any non-urgent questions, you may also contact your provider using MyChart. We now offer e-Visits for anyone 43 and older to request care online  for non-urgent symptoms. For details visit [email or web address].   Also download the MyChart app! Go to the app store, search "MyChart", open the app, select [facility], and log in with your MyChart username and password.  Oxaliplatin Injection What is this medication? OXALIPLATIN (ox AL i PLA tin) treats colorectal cancer. It works by slowing down the growth of cancer cells. This medicine may be used for other purposes; ask your health care provider or pharmacist if you have questions. COMMON BRAND NAME(S): Eloxatin What should I tell my care team before I take this medication? They need to know if you have any of these conditions: Heart disease History of irregular heartbeat or rhythm Liver disease Low blood cell levels (white cells, red cells, and platelets) Lung or breathing disease, such as asthma Take medications that treat or prevent blood clots Tingling of the fingers, toes, or other nerve disorder An unusual or allergic reaction to oxaliplatin, other medications, foods, dyes, or preservatives If you or your partner are pregnant or trying to get pregnant Breast-feeding How should I use this medication? This medication is injected into a vein. It is given by your care team in a hospital or clinic setting. Talk to your care team about the use of this medication in children. Special care may be needed. Overdosage: If you think you have taken too much of this medicine contact a poison control center or emergency room at once. NOTE: This medicine is only for you. Do not share this medicine with others. What if I miss a dose? Keep appointments for follow-up doses. It is important not to miss a dose. Call your care team if you are unable to keep an appointment. What may interact with this medication? Do not take this medication with any of the following: Cisapride Dronedarone Pimozide Thioridazine This medication may also interact with the following: Aspirin and aspirin-like  medications Certain medications that treat or prevent blood clots, such as warfarin, apixaban, dabigatran, and rivaroxaban Cisplatin Cyclosporine Diuretics Medications for infection, such as acyclovir, adefovir, amphotericin B, bacitracin, cidofovir, foscarnet, ganciclovir, gentamicin, pentamidine, vancomycin NSAIDs, medications for pain and inflammation, such as ibuprofen or naproxen Other medications that cause heart rhythm changes Pamidronate Zoledronic acid This list may not describe all possible interactions. Give your health care provider a list of all the medicines, herbs, non-prescription drugs, or dietary supplements you use. Also tell them if you smoke, drink alcohol, or use illegal drugs. Some items may interact with your medicine. What should I watch for while using this medication? Your condition will be monitored carefully while you are receiving this medication. You may need blood work while taking this medication. This medication may make you feel generally unwell. This is not uncommon as chemotherapy can affect healthy cells as well as cancer cells. Report any side effects. Continue your course of treatment even though you feel ill unless your care team tells you to stop. This medication may increase your risk of getting an infection. Call your care team for advice if you get a fever, chills, sore throat, or  other symptoms of a cold or flu. Do not treat yourself. Try to avoid being around people who are sick. Avoid taking medications that contain aspirin, acetaminophen, ibuprofen, naproxen, or ketoprofen unless instructed by your care team. These medications may hide a fever. Be careful brushing or flossing your teeth or using a toothpick because you may get an infection or bleed more easily. If you have any dental work done, tell your dentist you are receiving this medication. This medication can make you more sensitive to cold. Do not drink cold drinks or use ice. Cover exposed  skin before coming in contact with cold temperatures or cold objects. When out in cold weather wear warm clothing and cover your mouth and nose to warm the air that goes into your lungs. Tell your care team if you get sensitive to the cold. Talk to your care team if you or your partner are pregnant or think either of you might be pregnant. This medication can cause serious birth defects if taken during pregnancy and for 9 months after the last dose. A negative pregnancy test is required before starting this medication. A reliable form of contraception is recommended while taking this medication and for 9 months after the last dose. Talk to your care team about effective forms of contraception. Do not father a child while taking this medication and for 6 months after the last dose. Use a condom while having sex during this time period. Do not breastfeed while taking this medication and for 3 months after the last dose. This medication may cause infertility. Talk to your care team if you are concerned about your fertility. What side effects may I notice from receiving this medication? Side effects that you should report to your care team as soon as possible: Allergic reactions--skin rash, itching, hives, swelling of the face, lips, tongue, or throat Bleeding--bloody or black, tar-like stools, vomiting blood or brown material that looks like coffee grounds, red or dark brown urine, small red or purple spots on skin, unusual bruising or bleeding Dry cough, shortness of breath or trouble breathing Heart rhythm changes--fast or irregular heartbeat, dizziness, feeling faint or lightheaded, chest pain, trouble breathing Infection--fever, chills, cough, sore throat, wounds that don't heal, pain or trouble when passing urine, general feeling of discomfort or being unwell Liver injury--right upper belly pain, loss of appetite, nausea, light-colored stool, dark yellow or brown urine, yellowing skin or eyes, unusual  weakness or fatigue Low red blood cell level--unusual weakness or fatigue, dizziness, headache, trouble breathing Muscle injury--unusual weakness or fatigue, muscle pain, dark yellow or brown urine, decrease in amount of urine Pain, tingling, or numbness in the hands or feet Sudden and severe headache, confusion, change in vision, seizures, which may be signs of posterior reversible encephalopathy syndrome (PRES) Unusual bruising or bleeding Side effects that usually do not require medical attention (report to your care team if they continue or are bothersome): Diarrhea Nausea Pain, redness, or swelling with sores inside the mouth or throat Unusual weakness or fatigue Vomiting This list may not describe all possible side effects. Call your doctor for medical advice about side effects. You may report side effects to FDA at 1-800-FDA-1088. Where should I keep my medication? This medication is given in a hospital or clinic. It will not be stored at home. NOTE: This sheet is a summary. It may not cover all possible information. If you have questions about this medicine, talk to your doctor, pharmacist, or health care provider.  2023 Elsevier/Gold Standard (  2007-09-21 00:00:00)  Leucovorin Injection What is this medication? LEUCOVORIN (loo koe VOR in) prevents side effects from certain medications, such as methotrexate. It works by increasing folate levels. This helps protect healthy cells in your body. It may also be used to treat anemia caused by low levels of folate. It can also be used with fluorouracil, a type of chemotherapy, to treat colorectal cancer. It works by increasing the effects of fluorouracil in the body. This medicine may be used for other purposes; ask your health care provider or pharmacist if you have questions. What should I tell my care team before I take this medication? They need to know if you have any of these conditions: Anemia from low levels of vitamin B12 in the  blood An unusual or allergic reaction to leucovorin, folic acid, other medications, foods, dyes, or preservatives Pregnant or trying to get pregnant Breastfeeding How should I use this medication? This medication is injected into a vein or a muscle. It is given by your care team in a hospital or clinic setting. Talk to your care team about the use of this medication in children. Special care may be needed. Overdosage: If you think you have taken too much of this medicine contact a poison control center or emergency room at once. NOTE: This medicine is only for you. Do not share this medicine with others. What if I miss a dose? Keep appointments for follow-up doses. It is important not to miss your dose. Call your care team if you are unable to keep an appointment. What may interact with this medication? Capecitabine Fluorouracil Phenobarbital Phenytoin Primidone Trimethoprim;sulfamethoxazole This list may not describe all possible interactions. Give your health care provider a list of all the medicines, herbs, non-prescription drugs, or dietary supplements you use. Also tell them if you smoke, drink alcohol, or use illegal drugs. Some items may interact with your medicine. What should I watch for while using this medication? Your condition will be monitored carefully while you are receiving this medication. This medication may increase the side effects of 5-fluorouracil. Tell your care team if you have diarrhea or mouth sores that do not get better or that get worse. What side effects may I notice from receiving this medication? Side effects that you should report to your care team as soon as possible: Allergic reactions--skin rash, itching, hives, swelling of the face, lips, tongue, or throat This list may not describe all possible side effects. Call your doctor for medical advice about side effects. You may report side effects to FDA at 1-800-FDA-1088. Where should I keep my  medication? This medication is given in a hospital or clinic. It will not be stored at home. NOTE: This sheet is a summary. It may not cover all possible information. If you have questions about this medicine, talk to your doctor, pharmacist, or health care provider.  2023 Elsevier/Gold Standard (2021-12-09 00:00:00)  Fluorouracil Injection What is this medication? FLUOROURACIL (flure oh YOOR a sil) treats some types of cancer. It works by slowing down the growth of cancer cells. This medicine may be used for other purposes; ask your health care provider or pharmacist if you have questions. COMMON BRAND NAME(S): Adrucil What should I tell my care team before I take this medication? They need to know if you have any of these conditions: Blood disorders Dihydropyrimidine dehydrogenase (DPD) deficiency Infection, such as chickenpox, cold sores, herpes Kidney disease Liver disease Poor nutrition Recent or ongoing radiation therapy An unusual or allergic reaction to  fluorouracil, other medications, foods, dyes, or preservatives If you or your partner are pregnant or trying to get pregnant Breast-feeding How should I use this medication? This medication is injected into a vein. It is administered by your care team in a hospital or clinic setting. Talk to your care team about the use of this medication in children. Special care may be needed. Overdosage: If you think you have taken too much of this medicine contact a poison control center or emergency room at once. NOTE: This medicine is only for you. Do not share this medicine with others. What if I miss a dose? Keep appointments for follow-up doses. It is important not to miss your dose. Call your care team if you are unable to keep an appointment. What may interact with this medication? Do not take this medication with any of the following: Live virus vaccines This medication may also interact with the following: Medications that treat  or prevent blood clots, such as warfarin, enoxaparin, dalteparin This list may not describe all possible interactions. Give your health care provider a list of all the medicines, herbs, non-prescription drugs, or dietary supplements you use. Also tell them if you smoke, drink alcohol, or use illegal drugs. Some items may interact with your medicine. What should I watch for while using this medication? Your condition will be monitored carefully while you are receiving this medication. This medication may make you feel generally unwell. This is not uncommon as chemotherapy can affect healthy cells as well as cancer cells. Report any side effects. Continue your course of treatment even though you feel ill unless your care team tells you to stop. In some cases, you may be given additional medications to help with side effects. Follow all directions for their use. This medication may increase your risk of getting an infection. Call your care team for advice if you get a fever, chills, sore throat, or other symptoms of a cold or flu. Do not treat yourself. Try to avoid being around people who are sick. This medication may increase your risk to bruise or bleed. Call your care team if you notice any unusual bleeding. Be careful brushing or flossing your teeth or using a toothpick because you may get an infection or bleed more easily. If you have any dental work done, tell your dentist you are receiving this medication. Avoid taking medications that contain aspirin, acetaminophen, ibuprofen, naproxen, or ketoprofen unless instructed by your care team. These medications may hide a fever. Do not treat diarrhea with over the counter products. Contact your care team if you have diarrhea that lasts more than 2 days or if it is severe and watery. This medication can make you more sensitive to the sun. Keep out of the sun. If you cannot avoid being in the sun, wear protective clothing and sunscreen. Do not use sun lamps,  tanning beds, or tanning booths. Talk to your care team if you or your partner wish to become pregnant or think you might be pregnant. This medication can cause serious birth defects if taken during pregnancy and for 3 months after the last dose. A reliable form of contraception is recommended while taking this medication and for 3 months after the last dose. Talk to your care team about effective forms of contraception. Do not father a child while taking this medication and for 3 months after the last dose. Use a condom while having sex during this time period. Do not breastfeed while taking this medication. This medication  may cause infertility. Talk to your care team if you are concerned about your fertility. What side effects may I notice from receiving this medication? Side effects that you should report to your care team as soon as possible: Allergic reactions--skin rash, itching, hives, swelling of the face, lips, tongue, or throat Heart attack--pain or tightness in the chest, shoulders, arms, or jaw, nausea, shortness of breath, cold or clammy skin, feeling faint or lightheaded Heart failure--shortness of breath, swelling of the ankles, feet, or hands, sudden weight gain, unusual weakness or fatigue Heart rhythm changes--fast or irregular heartbeat, dizziness, feeling faint or lightheaded, chest pain, trouble breathing High ammonia level--unusual weakness or fatigue, confusion, loss of appetite, nausea, vomiting, seizures Infection--fever, chills, cough, sore throat, wounds that don't heal, pain or trouble when passing urine, general feeling of discomfort or being unwell Low red blood cell level--unusual weakness or fatigue, dizziness, headache, trouble breathing Pain, tingling, or numbness in the hands or feet, muscle weakness, change in vision, confusion or trouble speaking, loss of balance or coordination, trouble walking, seizures Redness, swelling, and blistering of the skin over hands and  feet Severe or prolonged diarrhea Unusual bruising or bleeding Side effects that usually do not require medical attention (report to your care team if they continue or are bothersome): Dry skin Headache Increased tears Nausea Pain, redness, or swelling with sores inside the mouth or throat Sensitivity to light Vomiting This list may not describe all possible side effects. Call your doctor for medical advice about side effects. You may report side effects to FDA at 1-800-FDA-1088. Where should I keep my medication? This medication is given in a hospital or clinic. It will not be stored at home. NOTE: This sheet is a summary. It may not cover all possible information. If you have questions about this medicine, talk to your doctor, pharmacist, or health care provider.  2023 Elsevier/Gold Standard (2021-11-29 00:00:00)

## 2022-10-18 NOTE — Progress Notes (Signed)
Patient seen by Lisa Thomas NP today  Vitals are within treatment parameters.  Labs reviewed by Lisa Thomas NP and are within treatment parameters.  Per physician team, patient is ready for treatment and there are NO modifications to the treatment plan.  

## 2022-10-18 NOTE — Progress Notes (Signed)
  [facility] OFFICE PROGRESS NOTE   Diagnosis: Colon cancer  INTERVAL HISTORY:   Ms. Twardzik returns as scheduled.  She completed cycle 5 FOLFOX 10/04/2022.  She had some mild queasiness.  No vomiting.  No mouth sores.  No diarrhea.  Cold sensitivity lasted about 5 days.  No persistent neuropathy symptoms.  Objective:  Vital signs in last 24 hours:  Blood pressure 113/75, pulse 82, temperature 98.1 F (36.7 C), temperature source Oral, resp. rate 18, height '5\' 4"'$  (1.626 m), weight 145 lb 9.6 oz (66 kg), last menstrual period 03/28/2013, SpO2 98 %.    HEENT: No thrush or ulcers. Resp: Lungs clear bilaterally. Cardio: Regular rate and rhythm. GI: Abdomen soft and nontender.  No hepatosplenomegaly. Vascular: No leg edema. Neuro: Vibratory sense intact over the fingertips per tuning fork exam. Skin: Palms with mild erythema. Port-A-Cath without erythema.   Lab Results:  Lab Results  Component Value Date   WBC 11.1 (H) 10/18/2022   HGB 11.3 (L) 10/18/2022   HCT 34.0 (L) 10/18/2022   MCV 94.2 10/18/2022   PLT 140 (L) 10/18/2022   NEUTROABS PENDING 10/18/2022    Imaging:  No results found.  Medications: I have reviewed the patient's current medications.  Assessment/Plan: Colon cancer, stage IIa (T3 N0 M0), sigmoidectomy 04/29/2021 Colonoscopy 03/24/2021-partially obstructing mass in the sigmoid colon, biopsy-adenocarcinoma CTs 03/28/2021-sigmoid colon mass, prominent presacral and sigmoid mesentery lymph nodes-nonspecific, no evidence of distant metastatic disease, sclerotic lesions at the left intertrochanteric region-likely a bone island Sigmoidectomy 04/29/2021-sigmoid colon tumor, 0/16 lymph nodes, no lymphovascular or perineural invasion, no loss of mismatch repair protein expression, MSS Foundation 1-MSS, tumor mutation burden 6, K-ras and NRAS wild-type, ERBB2 amplification-equivocal HER2 2+, negative by FISH Colonoscopy 04/27/2022-polyps removed from the  ascending and transverse colon-tubular adenomas 05/31/2022 CEA 8.54 06/28/2022 CEA 9.48 07/10/2022 CTs-new lesion central left hepatic lobe 07/16/2022 MRI 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 Seen by Dr. Fayrene Helper with recommendation for a 67-monthcourse of chemotherapy prior to surgery Cycle 1 FOLFOX 08/09/2022 Cycle 2 FOLFOX 08/23/2022 Cycle 3 FOLFOX 09/06/2022 Cycle 4 FOLFOX 09/20/2022 Cycle 5 FOLFOX 10/04/2022 Cycle 6 FOLFOX 10/18/2022   2.   Rectal bleeding, appointment with gastroenterology 09/25/2022-anal fissure per GI exam 09/11/2022.  Completed treatment for an anal fissure.  Flexible sigmoidoscopy 10/03/2022-moderate diverticulosis in the sigmoid colon and in the descending colon; patent end-to-side colocolonic anastomosis; anal papilla hypertrophied.  Rectal bleeding felt to be due to anal fissure.  Disposition: Ms. SRasconappears stable.  She has completed 5 cycles of FOLFOX.  She continues to tolerate chemotherapy well.  Plan to proceed with the 6th and final cycle today as scheduled.  She is scheduled for restaging studies and follow-up with Dr. LFayrene Helper3/18/2024.  Surgery is scheduled on 11/14/2022.    Labs from today are pending.    She will return for follow-up here on 11/30/2022.  Patient seen with Dr. SBenay Spice  LNed CardANP/GNP-BC   10/18/2022  10:42 AM  This was a shared visit with LNed Card  Ms. SWurtswill complete a final cycle of FOLFOX today.  She is scheduled for restaging and follow-up with Dr. LFayrene Helperin a few weeks.  She has tolerated the FOLFOX well.  We will plan to see her after surgery.  I was present for greater than 50% of today's visit.  I performed medical decision making.  BJulieanne Manson MD

## 2022-10-20 ENCOUNTER — Inpatient Hospital Stay: Payer: Medicare Other

## 2022-10-20 VITALS — BP 129/74 | HR 65 | Temp 98.2°F | Resp 20

## 2022-10-20 DIAGNOSIS — Z5111 Encounter for antineoplastic chemotherapy: Secondary | ICD-10-CM | POA: Diagnosis not present

## 2022-10-20 DIAGNOSIS — Z5189 Encounter for other specified aftercare: Secondary | ICD-10-CM | POA: Diagnosis not present

## 2022-10-20 DIAGNOSIS — Z85038 Personal history of other malignant neoplasm of large intestine: Secondary | ICD-10-CM

## 2022-10-20 DIAGNOSIS — C187 Malignant neoplasm of sigmoid colon: Secondary | ICD-10-CM | POA: Diagnosis not present

## 2022-10-20 MED ORDER — PEGFILGRASTIM-CBQV 6 MG/0.6ML ~~LOC~~ SOSY
6.0000 mg | PREFILLED_SYRINGE | Freq: Once | SUBCUTANEOUS | Status: AC
  Administered 2022-10-20: 6 mg via SUBCUTANEOUS
  Filled 2022-10-20: qty 0.6

## 2022-10-20 MED ORDER — HEPARIN SOD (PORK) LOCK FLUSH 100 UNIT/ML IV SOLN
500.0000 [IU] | Freq: Once | INTRAVENOUS | Status: AC | PRN
  Administered 2022-10-20: 500 [IU]

## 2022-10-20 MED ORDER — SODIUM CHLORIDE 0.9% FLUSH
10.0000 mL | INTRAVENOUS | Status: DC | PRN
  Administered 2022-10-20: 10 mL

## 2022-10-20 NOTE — Patient Instructions (Signed)

## 2022-10-30 ENCOUNTER — Other Ambulatory Visit: Payer: Self-pay | Admitting: Nurse Practitioner

## 2022-10-30 DIAGNOSIS — C187 Malignant neoplasm of sigmoid colon: Secondary | ICD-10-CM

## 2022-10-30 DIAGNOSIS — C787 Secondary malignant neoplasm of liver and intrahepatic bile duct: Secondary | ICD-10-CM | POA: Diagnosis not present

## 2022-10-30 DIAGNOSIS — Z01818 Encounter for other preprocedural examination: Secondary | ICD-10-CM | POA: Diagnosis not present

## 2022-10-30 DIAGNOSIS — C189 Malignant neoplasm of colon, unspecified: Secondary | ICD-10-CM | POA: Diagnosis not present

## 2022-10-30 DIAGNOSIS — C19 Malignant neoplasm of rectosigmoid junction: Secondary | ICD-10-CM | POA: Diagnosis not present

## 2022-10-30 DIAGNOSIS — K769 Liver disease, unspecified: Secondary | ICD-10-CM

## 2022-10-30 DIAGNOSIS — R160 Hepatomegaly, not elsewhere classified: Secondary | ICD-10-CM | POA: Diagnosis not present

## 2022-11-09 ENCOUNTER — Encounter: Payer: Self-pay | Admitting: Oncology

## 2022-11-09 ENCOUNTER — Inpatient Hospital Stay: Payer: Medicare Other

## 2022-11-09 ENCOUNTER — Encounter: Payer: Self-pay | Admitting: *Deleted

## 2022-11-09 ENCOUNTER — Encounter: Payer: Managed Care, Other (non HMO) | Admitting: Family Medicine

## 2022-11-09 DIAGNOSIS — C187 Malignant neoplasm of sigmoid colon: Secondary | ICD-10-CM | POA: Diagnosis not present

## 2022-11-09 DIAGNOSIS — K769 Liver disease, unspecified: Secondary | ICD-10-CM

## 2022-11-09 DIAGNOSIS — Z5189 Encounter for other specified aftercare: Secondary | ICD-10-CM | POA: Diagnosis not present

## 2022-11-09 DIAGNOSIS — Z5111 Encounter for antineoplastic chemotherapy: Secondary | ICD-10-CM | POA: Diagnosis not present

## 2022-11-09 DIAGNOSIS — Z95828 Presence of other vascular implants and grafts: Secondary | ICD-10-CM

## 2022-11-09 LAB — CBC WITH DIFFERENTIAL (CANCER CENTER ONLY)
Abs Immature Granulocytes: 0.05 10*3/uL (ref 0.00–0.07)
Basophils Absolute: 0 10*3/uL (ref 0.0–0.1)
Basophils Relative: 1 %
Eosinophils Absolute: 0 10*3/uL (ref 0.0–0.5)
Eosinophils Relative: 0 %
HCT: 33.8 % — ABNORMAL LOW (ref 36.0–46.0)
Hemoglobin: 11.5 g/dL — ABNORMAL LOW (ref 12.0–15.0)
Immature Granulocytes: 1 %
Lymphocytes Relative: 15 %
Lymphs Abs: 0.8 10*3/uL (ref 0.7–4.0)
MCH: 33.1 pg (ref 26.0–34.0)
MCHC: 34 g/dL (ref 30.0–36.0)
MCV: 97.4 fL (ref 80.0–100.0)
Monocytes Absolute: 0.4 10*3/uL (ref 0.1–1.0)
Monocytes Relative: 8 %
Neutro Abs: 4 10*3/uL (ref 1.7–7.7)
Neutrophils Relative %: 75 %
Platelet Count: 130 10*3/uL — ABNORMAL LOW (ref 150–400)
RBC: 3.47 MIL/uL — ABNORMAL LOW (ref 3.87–5.11)
RDW: 18 % — ABNORMAL HIGH (ref 11.5–15.5)
WBC Count: 5.3 10*3/uL (ref 4.0–10.5)
nRBC: 0 % (ref 0.0–0.2)

## 2022-11-09 MED ORDER — HEPARIN SOD (PORK) LOCK FLUSH 100 UNIT/ML IV SOLN
500.0000 [IU] | Freq: Once | INTRAVENOUS | Status: AC
  Administered 2022-11-09: 500 [IU] via INTRAVENOUS

## 2022-11-09 MED ORDER — SODIUM CHLORIDE 0.9% FLUSH
10.0000 mL | INTRAVENOUS | Status: DC | PRN
  Administered 2022-11-09: 10 mL via INTRAVENOUS

## 2022-11-09 NOTE — Progress Notes (Signed)
Patient brought in Advanced Directive. Copy made and sent to be scanned by HIM.

## 2022-11-09 NOTE — Patient Instructions (Signed)

## 2022-11-14 ENCOUNTER — Other Ambulatory Visit: Payer: Self-pay

## 2022-11-14 DIAGNOSIS — C189 Malignant neoplasm of colon, unspecified: Secondary | ICD-10-CM | POA: Diagnosis not present

## 2022-11-14 DIAGNOSIS — Z85828 Personal history of other malignant neoplasm of skin: Secondary | ICD-10-CM | POA: Diagnosis not present

## 2022-11-14 DIAGNOSIS — Z85048 Personal history of other malignant neoplasm of rectum, rectosigmoid junction, and anus: Secondary | ICD-10-CM | POA: Diagnosis not present

## 2022-11-14 DIAGNOSIS — K567 Ileus, unspecified: Secondary | ICD-10-CM | POA: Diagnosis not present

## 2022-11-14 DIAGNOSIS — G8918 Other acute postprocedural pain: Secondary | ICD-10-CM | POA: Diagnosis not present

## 2022-11-14 DIAGNOSIS — I959 Hypotension, unspecified: Secondary | ICD-10-CM | POA: Diagnosis not present

## 2022-11-14 DIAGNOSIS — D62 Acute posthemorrhagic anemia: Secondary | ICD-10-CM | POA: Diagnosis not present

## 2022-11-14 DIAGNOSIS — D696 Thrombocytopenia, unspecified: Secondary | ICD-10-CM | POA: Diagnosis not present

## 2022-11-14 DIAGNOSIS — Z9221 Personal history of antineoplastic chemotherapy: Secondary | ICD-10-CM | POA: Diagnosis not present

## 2022-11-14 DIAGNOSIS — Z79899 Other long term (current) drug therapy: Secondary | ICD-10-CM | POA: Diagnosis not present

## 2022-11-14 DIAGNOSIS — C787 Secondary malignant neoplasm of liver and intrahepatic bile duct: Secondary | ICD-10-CM | POA: Diagnosis not present

## 2022-11-14 DIAGNOSIS — C19 Malignant neoplasm of rectosigmoid junction: Secondary | ICD-10-CM | POA: Diagnosis not present

## 2022-11-14 DIAGNOSIS — K7210 Chronic hepatic failure without coma: Secondary | ICD-10-CM | POA: Diagnosis not present

## 2022-11-14 DIAGNOSIS — K7290 Hepatic failure, unspecified without coma: Secondary | ICD-10-CM | POA: Diagnosis not present

## 2022-11-14 DIAGNOSIS — Z9049 Acquired absence of other specified parts of digestive tract: Secondary | ICD-10-CM | POA: Diagnosis not present

## 2022-11-14 DIAGNOSIS — K5900 Constipation, unspecified: Secondary | ICD-10-CM | POA: Diagnosis not present

## 2022-11-20 ENCOUNTER — Encounter: Payer: Self-pay | Admitting: *Deleted

## 2022-11-20 ENCOUNTER — Telehealth: Payer: Self-pay | Admitting: *Deleted

## 2022-11-20 NOTE — Transitions of Care (Post Inpatient/ED Visit) (Signed)
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  My sister and my husband are here helping me if I need anything, but I really haven't needed anything at all") Any questions or concerns?: No  Items Reviewed: Did you receive and understand the discharge instructions provided?: Yes (thoroughly reviewed with patient who verbalizes very good understanding of same) Medications obtained and verified?: Yes (Medications Reviewed) (Partial medication review completed; patient declines full med review; confirmed patient obtained/ is taking all newly Rx'd medications as instructed; self-manages medications and denies questions/ concerns around medications today) Any new allergies since your discharge?: No Dietary orders reviewed?: Yes Type of Diet Ordered:: "Healthy" Do you have support at home?: Yes People in Home: spouse Name of Support/Comfort Primary Source: reports independent in self-care activities; spouse assists as needed/ indicated  Home Care and Equipment/Supplies: Were Home Health Services Ordered?: No Any new equipment or medical supplies ordered?: No  Functional Questionnaire: Do you need assistance with meal preparation?: No Do you need assistance with eating?: No Do you have difficulty maintaining continence:  No Do you need assistance with getting out of bed/getting out of a chair/moving?: No Do you have difficulty managing or taking your medications?: No  Follow up appointments reviewed: PCP Follow-up appointment confirmed?: NA (verified not indicated per hospital discharging provider discharge notes) Specialist Hospital Follow-up appointment confirmed?: Yes Date of Specialist follow-up appointment?: 11/27/22 Follow-Up Specialty Provider:: surgical provider at [facility] on 11/27/22; oncologist provider on 11/30/22 Do you need transportation to your follow-up appointment?: No Do you understand care options if your condition(s) worsen?: Yes-patient verbalized understanding  SDOH Interventions Today    Flowsheet Row Most Recent Value  SDOH Interventions   Food Insecurity Interventions Intervention Not Indicated  Transportation Interventions Intervention Not Indicated  [normally drives self,  friends and family assisting post-recent surgery]      TOC Interventions Today    Flowsheet Row Most Recent Value  TOC Interventions   TOC Interventions Discussed/Reviewed TOC Interventions Discussed, S/S of infection, Post op wound/incision care  [Patient declines need for ongoing/ further care coordination outreach,  no care coordination needs identified at time of TOC call today,  provided my direct contact information should questions/ concerns/ needs arise post-TOC call]      Interventions Today    Flowsheet Row Most Recent Value  Chronic Disease   Chronic disease during today's visit Other  [colon CA with mets to liver with surgical partial liver hepatectomy]  General Interventions   General Interventions Discussed/Reviewed General Interventions Discussed, Doctor Visits  Doctor Visits Discussed/Reviewed Specialist, Doctor Visits Discussed  PCP/Specialist Visits Compliance with follow-up visit  Education Interventions   Education Provided Provided Education  Provided Verbal Education On Medication   [purpose of Lovenox injections post-surgical intervention reinforced]  Nutrition Interventions   Nutrition Discussed/Reviewed Nutrition Discussed  Pharmacy Interventions   Pharmacy Dicussed/Reviewed Pharmacy Topics Discussed  [medication review of newly Rx'd medications post-surgery]  Oneta Rack, RN, BSN, CCRN Alumnus RN CM Care Coordination/ Transition of [place] Management [phone]: direct office

## 2022-11-22 NOTE — Progress Notes (Signed)
[facility] Attending Physician's Statement-Progress Report completed and faxed with confirmation received..   Fax no. [phone]

## 2022-11-27 DIAGNOSIS — Z79899 Other long term (current) drug therapy: Secondary | ICD-10-CM | POA: Diagnosis not present

## 2022-11-27 DIAGNOSIS — C787 Secondary malignant neoplasm of liver and intrahepatic bile duct: Secondary | ICD-10-CM | POA: Diagnosis not present

## 2022-11-27 DIAGNOSIS — C187 Malignant neoplasm of sigmoid colon: Secondary | ICD-10-CM | POA: Diagnosis not present

## 2022-11-27 DIAGNOSIS — Z9049 Acquired absence of other specified parts of digestive tract: Secondary | ICD-10-CM | POA: Diagnosis not present

## 2022-11-27 DIAGNOSIS — Z48815 Encounter for surgical aftercare following surgery on the digestive system: Secondary | ICD-10-CM | POA: Diagnosis not present

## 2022-11-30 ENCOUNTER — Inpatient Hospital Stay: Payer: Medicare Other | Attending: Oncology

## 2022-11-30 ENCOUNTER — Inpatient Hospital Stay: Payer: Medicare Other | Admitting: Oncology

## 2022-11-30 VITALS — BP 118/64 | HR 78 | Temp 98.1°F | Resp 18 | Ht 64.0 in | Wt 138.6 lb

## 2022-11-30 DIAGNOSIS — Z85038 Personal history of other malignant neoplasm of large intestine: Secondary | ICD-10-CM

## 2022-11-30 DIAGNOSIS — C187 Malignant neoplasm of sigmoid colon: Secondary | ICD-10-CM | POA: Diagnosis not present

## 2022-11-30 DIAGNOSIS — C787 Secondary malignant neoplasm of liver and intrahepatic bile duct: Secondary | ICD-10-CM | POA: Diagnosis not present

## 2022-11-30 LAB — CEA (ACCESS): CEA (CHCC): <1 ng/mL (ref 0.00–5.00)

## 2022-11-30 NOTE — Progress Notes (Signed)
[facility] OFFICE PROGRESS NOTE   Diagnosis: Colon cancer  INTERVAL HISTORY:   Joan Owen returns as scheduled.  She underwent a partial right liver resection on 11/14/2022.  There was no evidence of extrahepatic disease.  There was a solitary metastasis in segment 4A/8 grossly negative resection margins.  She reports mild discomfort at the upper abdomen surgical site.  She is walking for exercise.  Objective:  Vital signs in last 24 hours:  Blood pressure 118/64, pulse 78, temperature 98.1 F (36.7 C), temperature source Oral, resp. rate 18, height  (1.626 m), weight 138 lb 9.6 oz (62.9 kg), last menstrual period 03/28/2013, SpO2 100 %.    Resp: Lungs clear bilaterally Cardio: Regular rate and rhythm GI: No hepatosplenomegaly, no mass, healing upper abdominal incision with glue in place Vascular: No leg edema Neuro: Very mild loss of vibratory sense at the fingertips bilaterally  Portacath/PICC-without erythema  Lab Results:  Lab Results  Component Value Date   WBC 5.3 11/09/2022   HGB 11.5 (L) 11/09/2022   HCT 33.8 (L) 11/09/2022   MCV 97.4 11/09/2022   PLT 130 (L) 11/09/2022   NEUTROABS 4.0 11/09/2022    CMP  Lab Results  Component Value Date   NA 140 10/18/2022   K 4.0 10/18/2022   CL 104 10/18/2022   CO2 28 10/18/2022   GLUCOSE 101 (H) 10/18/2022   BUN 16 10/18/2022   CREATININE 0.72 10/18/2022   CALCIUM 9.8 10/18/2022   PROT 7.1 10/18/2022   ALBUMIN 4.1 10/18/2022   AST 42 (H) 10/18/2022   ALT 58 (H) 10/18/2022   ALKPHOS 116 10/18/2022   BILITOT 0.6 10/18/2022   GFRNONAA >60 10/18/2022    Lab Results  Component Value Date   CEA 6.01 (H) 10/18/2022    Medications: I have reviewed the patient's current medications.   Assessment/Plan: Colon cancer, stage IIa (T3 N0 M0), sigmoidectomy 04/29/2021 Colonoscopy 03/24/2021-partially obstructing mass in the sigmoid colon, biopsy-adenocarcinoma CTs 03/28/2021-sigmoid colon mass,  prominent presacral and sigmoid mesentery lymph nodes-nonspecific, no evidence of distant metastatic disease, sclerotic lesions at the left intertrochanteric region-likely a bone island Sigmoidectomy 04/29/2021-sigmoid colon tumor, 0/16 lymph nodes, no lymphovascular or perineural invasion, no loss of mismatch repair protein expression, MSS Foundation 1-MSS, tumor mutation burden 6, K-ras and NRAS wild-type, ERBB2 amplification-equivocal HER2 2+, negative by FISH Colonoscopy 04/27/2022-polyps removed from the ascending and transverse colon-tubular adenomas 05/31/2022 CEA 8.54 06/28/2022 CEA 9.48 07/10/2022 CTs-new lesion central left hepatic lobe 07/16/2022 MRI 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 Seen by Dr. Modesta Messing with recommendation for a 19-month course of chemotherapy prior to surgery Cycle 1 FOLFOX 08/09/2022 Cycle 2 FOLFOX 08/23/2022 Cycle 3 FOLFOX 09/06/2022 Cycle 4 FOLFOX 09/20/2022 Cycle 5 FOLFOX 10/04/2022 Cycle 6 FOLFOX 10/18/2022 Segment 4A/8 partial hepatectomy 11/14/2022-moderately differentiated adenocarcinoma compatible with a colorectal metastasis, 70% tumor necrosis, resection margin positive, R1 vascular margin per Dr. Modesta Messing, tumor dissected off of the vessel treated with intraoperative argon   2.   Rectal bleeding, appointment with gastroenterology 09/25/2022-anal fissure per GI exam 09/11/2022.  Completed treatment for an anal fissure.  Flexible sigmoidoscopy 10/03/2022-moderate diverticulosis in the sigmoid colon and in the descending colon; patent end-to-side colocolonic anastomosis; anal papilla hypertrophied.  Rectal bleeding felt to be due to anal fissure.   Disposition: Joan Owen is recovering from the recent partial hepatectomy.  The liver resection confirmed metastatic colon cancer.  She had a complete gross resection with a positive margin.  Dr. Modesta Messing discussed the  case with me last week.  He indicates the margin is a vascular margin where tumor  was taken off of a vessel and the area received argon treatment.  He recommends 3 months of adjuvant FOLFOX.  Joan Owen will return for an office visit with the plan to resume FOLFOX chemotherapy 12/20/2022.  Thornton Papas, MD  11/30/2022  9:39 AM

## 2022-12-01 ENCOUNTER — Other Ambulatory Visit: Payer: Self-pay

## 2022-12-05 ENCOUNTER — Other Ambulatory Visit: Payer: Self-pay

## 2022-12-12 DIAGNOSIS — K080 Exfoliation of teeth due to systemic causes: Secondary | ICD-10-CM | POA: Diagnosis not present

## 2022-12-17 ENCOUNTER — Other Ambulatory Visit: Payer: Self-pay | Admitting: Oncology

## 2022-12-20 ENCOUNTER — Inpatient Hospital Stay: Payer: Medicare Other | Admitting: Nurse Practitioner

## 2022-12-20 ENCOUNTER — Inpatient Hospital Stay: Payer: Medicare Other

## 2022-12-20 ENCOUNTER — Inpatient Hospital Stay: Payer: Medicare Other | Attending: Oncology

## 2022-12-20 ENCOUNTER — Encounter: Payer: Self-pay | Admitting: Nurse Practitioner

## 2022-12-20 DIAGNOSIS — Z5189 Encounter for other specified aftercare: Secondary | ICD-10-CM | POA: Diagnosis not present

## 2022-12-20 DIAGNOSIS — C187 Malignant neoplasm of sigmoid colon: Secondary | ICD-10-CM | POA: Diagnosis not present

## 2022-12-20 DIAGNOSIS — Z5111 Encounter for antineoplastic chemotherapy: Secondary | ICD-10-CM | POA: Insufficient documentation

## 2022-12-20 DIAGNOSIS — Z85038 Personal history of other malignant neoplasm of large intestine: Secondary | ICD-10-CM

## 2022-12-20 LAB — CMP (CANCER CENTER ONLY)
ALT: 18 U/L (ref 0–44)
AST: 17 U/L (ref 15–41)
Albumin: 4.2 g/dL (ref 3.5–5.0)
Alkaline Phosphatase: 79 U/L (ref 38–126)
Anion gap: 8 (ref 5–15)
BUN: 13 mg/dL (ref 8–23)
CO2: 27 mmol/L (ref 22–32)
Calcium: 9.5 mg/dL (ref 8.9–10.3)
Chloride: 105 mmol/L (ref 98–111)
Creatinine: 0.68 mg/dL (ref 0.44–1.00)
GFR, Estimated: >60 mL/min (ref >60)
Glucose, Bld: 107 mg/dL — ABNORMAL HIGH (ref 70–99)
Potassium: 3.9 mmol/L (ref 3.5–5.1)
Sodium: 140 mmol/L (ref 135–145)
Total Bilirubin: 0.5 mg/dL (ref 0.3–1.2)
Total Protein: 7.2 g/dL (ref 6.5–8.1)

## 2022-12-20 LAB — CBC WITH DIFFERENTIAL (CANCER CENTER ONLY)
Abs Immature Granulocytes: 0 10*3/uL (ref 0.00–0.07)
Basophils Absolute: 0 10*3/uL (ref 0.0–0.1)
Basophils Relative: 1 %
Eosinophils Absolute: 0 10*3/uL (ref 0.0–0.5)
Eosinophils Relative: 1 %
HCT: 33 % — ABNORMAL LOW (ref 36.0–46.0)
Hemoglobin: 10.8 g/dL — ABNORMAL LOW (ref 12.0–15.0)
Immature Granulocytes: 0 %
Lymphocytes Relative: 37 %
Lymphs Abs: 1.2 10*3/uL (ref 0.7–4.0)
MCH: 32 pg (ref 26.0–34.0)
MCHC: 32.7 g/dL (ref 30.0–36.0)
MCV: 97.6 fL (ref 80.0–100.0)
Monocytes Absolute: 0.3 10*3/uL (ref 0.1–1.0)
Monocytes Relative: 8 %
Neutro Abs: 1.8 10*3/uL (ref 1.7–7.7)
Neutrophils Relative %: 53 %
Platelet Count: 169 10*3/uL (ref 150–400)
RBC: 3.38 MIL/uL — ABNORMAL LOW (ref 3.87–5.11)
RDW: 12.2 % (ref 11.5–15.5)
WBC Count: 3.4 10*3/uL — ABNORMAL LOW (ref 4.0–10.5)
nRBC: 0 % (ref 0.0–0.2)

## 2022-12-20 LAB — CEA (ACCESS): CEA (CHCC): <1 ng/mL (ref 0.00–5.00)

## 2022-12-20 MED ORDER — SODIUM CHLORIDE 0.9 % IV SOLN
2400.0000 mg/m2 | INTRAVENOUS | Status: DC
  Administered 2022-12-20: 4050 mg via INTRAVENOUS
  Filled 2022-12-20: qty 81

## 2022-12-20 MED ORDER — OXALIPLATIN CHEMO INJECTION 100 MG/20ML
85.0000 mg/m2 | Freq: Once | INTRAVENOUS | Status: AC
  Administered 2022-12-20: 150 mg via INTRAVENOUS
  Filled 2022-12-20: qty 20

## 2022-12-20 MED ORDER — SODIUM CHLORIDE 0.9 % IV SOLN
10.0000 mg | Freq: Once | INTRAVENOUS | Status: AC
  Administered 2022-12-20: 10 mg via INTRAVENOUS
  Filled 2022-12-20: qty 10

## 2022-12-20 MED ORDER — FLUOROURACIL CHEMO INJECTION 2.5 GM/50ML
400.0000 mg/m2 | Freq: Once | INTRAVENOUS | Status: AC
  Administered 2022-12-20: 700 mg via INTRAVENOUS
  Filled 2022-12-20: qty 14

## 2022-12-20 MED ORDER — LEUCOVORIN CALCIUM INJECTION 350 MG
400.0000 mg/m2 | Freq: Once | INTRAVENOUS | Status: AC
  Administered 2022-12-20: 676 mg via INTRAVENOUS
  Filled 2022-12-20: qty 33.8

## 2022-12-20 MED ORDER — DEXTROSE 5 % IV SOLN: Freq: Once | INTRAVENOUS | Status: AC

## 2022-12-20 MED ORDER — PALONOSETRON HCL INJECTION 0.25 MG/5ML
0.2500 mg | Freq: Once | INTRAVENOUS | Status: AC
  Administered 2022-12-20: 0.25 mg via INTRAVENOUS
  Filled 2022-12-20: qty 5

## 2022-12-20 NOTE — Patient Instructions (Signed)
[facility]   The chemotherapy medication bag should finish at 46 hours, 96 hours, or 7 days. For example, if your pump is scheduled for 46 hours and it was put on at 4:00 p.m., it should finish at 2:00 p.m. the day it is scheduled to come off regardless of your appointment time.     Estimated time to finish at 1:30 Friday October 20, 2022.   If the display on your pump reads "Low Volume" and it is beeping, take the batteries out of the pump and come to the cancer center for it to be taken off.   If the pump alarms go off prior to the pump reading "Low Volume" then call 1-800-315-3287 and someone can assist you.  If the plunger comes out and the chemotherapy medication is leaking out, please use your home chemo spill kit to clean up the spill. Do NOT use paper towels or other household products.  If you have problems or questions regarding your pump, please call either 1-800-315-3287 (24 hours a day) or the cancer center Monday-Friday 8:00 a.m.- 4:30 p.m. at the clinic number and we will assist you. If you are unable to get assistance, then go to the nearest Emergency Department and ask the staff to contact the IV team for assistance.  Discharge Instructions: Thank you for choosing [facility] to provide your oncology and hematology care.   If you have a lab appointment with the Cancer Center, please go directly to the Cancer Center and check in at the registration area.   Wear comfortable clothing and clothing appropriate for easy access to any Portacath or PICC line.   We strive to give you quality time with your provider. You may need to reschedule your appointment if you arrive late (15 or more minutes).  Arriving late affects you and other patients whose appointments are after yours.  Also, if you miss three or more appointments without notifying the office, you may be dismissed from the clinic at the provider's discretion.      For  prescription refill requests, have your pharmacy contact our office and allow 72 hours for refills to be completed.    Today you received the following chemotherapy and/or immunotherapy agents Oxaliplatin, Leucovorin, Fluorouracil.      To help prevent nausea and vomiting after your treatment, we encourage you to take your nausea medication as directed.  BELOW ARE SYMPTOMS THAT SHOULD BE REPORTED IMMEDIATELY: *FEVER GREATER THAN 100.4 F (38 C) OR HIGHER *CHILLS OR SWEATING *NAUSEA AND VOMITING THAT IS NOT CONTROLLED WITH YOUR NAUSEA MEDICATION *UNUSUAL SHORTNESS OF BREATH *UNUSUAL BRUISING OR BLEEDING *URINARY PROBLEMS (pain or burning when urinating, or frequent urination) *BOWEL PROBLEMS (unusual diarrhea, constipation, pain near the anus) TENDERNESS IN MOUTH AND THROAT WITH OR WITHOUT PRESENCE OF ULCERS (sore throat, sores in mouth, or a toothache) UNUSUAL RASH, SWELLING OR PAIN  UNUSUAL VAGINAL DISCHARGE OR ITCHING   Items with * indicate a potential emergency and should be followed up as soon as possible or go to the Emergency Department if any problems should occur.  Please show the CHEMOTHERAPY ALERT CARD or IMMUNOTHERAPY ALERT CARD at check-in to the Emergency Department and triage nurse.  Should you have questions after your visit or need to cancel or reschedule your appointment, please contact [facility]  Dept: [phone]  and follow the prompts.  Office hours are 8:00 a.m. to 4:30 p.m. Monday - Friday. Please note   that voicemails left after 4:00 p.m. may not be returned until the following business day.  We are closed weekends and major holidays. You have access to a nurse at all times for urgent questions. Please call the main number to the clinic Dept: [phone] and follow the prompts.   For any non-urgent questions, you may also contact your provider using MyChart. We now offer e-Visits for anyone 18 and older to request care online  for non-urgent symptoms. For details visit [email or web address].   Also download the MyChart app! Go to the app store, search "MyChart", open the app, select [facility], and log in with your MyChart username and password.  Oxaliplatin Injection What is this medication? OXALIPLATIN (ox AL i PLA tin) treats colorectal cancer. It works by slowing down the growth of cancer cells. This medicine may be used for other purposes; ask your health care provider or pharmacist if you have questions. COMMON BRAND NAME(S): Eloxatin What should I tell my care team before I take this medication? They need to know if you have any of these conditions: Heart disease History of irregular heartbeat or rhythm Liver disease Low blood cell levels (white cells, red cells, and platelets) Lung or breathing disease, such as asthma Take medications that treat or prevent blood clots Tingling of the fingers, toes, or other nerve disorder An unusual or allergic reaction to oxaliplatin, other medications, foods, dyes, or preservatives If you or your partner are pregnant or trying to get pregnant Breast-feeding How should I use this medication? This medication is injected into a vein. It is given by your care team in a hospital or clinic setting. Talk to your care team about the use of this medication in children. Special care may be needed. Overdosage: If you think you have taken too much of this medicine contact a poison control center or emergency room at once. NOTE: This medicine is only for you. Do not share this medicine with others. What if I miss a dose? Keep appointments for follow-up doses. It is important not to miss a dose. Call your care team if you are unable to keep an appointment. What may interact with this medication? Do not take this medication with any of the following: Cisapride Dronedarone Pimozide Thioridazine This medication may also interact with the following: Aspirin and aspirin-like  medications Certain medications that treat or prevent blood clots, such as warfarin, apixaban, dabigatran, and rivaroxaban Cisplatin Cyclosporine Diuretics Medications for infection, such as acyclovir, adefovir, amphotericin B, bacitracin, cidofovir, foscarnet, ganciclovir, gentamicin, pentamidine, vancomycin NSAIDs, medications for pain and inflammation, such as ibuprofen or naproxen Other medications that cause heart rhythm changes Pamidronate Zoledronic acid This list may not describe all possible interactions. Give your health care provider a list of all the medicines, herbs, non-prescription drugs, or dietary supplements you use. Also tell them if you smoke, drink alcohol, or use illegal drugs. Some items may interact with your medicine. What should I watch for while using this medication? Your condition will be monitored carefully while you are receiving this medication. You may need blood work while taking this medication. This medication may make you feel generally unwell. This is not uncommon as chemotherapy can affect healthy cells as well as cancer cells. Report any side effects. Continue your course of treatment even though you feel ill unless your care team tells you to stop. This medication may increase your risk of getting an infection. Call your care team for advice if you get a fever, chills, sore throat, or   other symptoms of a cold or flu. Do not treat yourself. Try to avoid being around people who are sick. Avoid taking medications that contain aspirin, acetaminophen, ibuprofen, naproxen, or ketoprofen unless instructed by your care team. These medications may hide a fever. Be careful brushing or flossing your teeth or using a toothpick because you may get an infection or bleed more easily. If you have any dental work done, tell your dentist you are receiving this medication. This medication can make you more sensitive to cold. Do not drink cold drinks or use ice. Cover exposed  skin before coming in contact with cold temperatures or cold objects. When out in cold weather wear warm clothing and cover your mouth and nose to warm the air that goes into your lungs. Tell your care team if you get sensitive to the cold. Talk to your care team if you or your partner are pregnant or think either of you might be pregnant. This medication can cause serious birth defects if taken during pregnancy and for 9 months after the last dose. A negative pregnancy test is required before starting this medication. A reliable form of contraception is recommended while taking this medication and for 9 months after the last dose. Talk to your care team about effective forms of contraception. Do not father a child while taking this medication and for 6 months after the last dose. Use a condom while having sex during this time period. Do not breastfeed while taking this medication and for 3 months after the last dose. This medication may cause infertility. Talk to your care team if you are concerned about your fertility. What side effects may I notice from receiving this medication? Side effects that you should report to your care team as soon as possible: Allergic reactions--skin rash, itching, hives, swelling of the face, lips, tongue, or throat Bleeding--bloody or black, tar-like stools, vomiting blood or brown material that looks like coffee grounds, red or dark brown urine, small red or purple spots on skin, unusual bruising or bleeding Dry cough, shortness of breath or trouble breathing Heart rhythm changes--fast or irregular heartbeat, dizziness, feeling faint or lightheaded, chest pain, trouble breathing Infection--fever, chills, cough, sore throat, wounds that don't heal, pain or trouble when passing urine, general feeling of discomfort or being unwell Liver injury--right upper belly pain, loss of appetite, nausea, light-colored stool, dark yellow or brown urine, yellowing skin or eyes, unusual  weakness or fatigue Low red blood cell level--unusual weakness or fatigue, dizziness, headache, trouble breathing Muscle injury--unusual weakness or fatigue, muscle pain, dark yellow or brown urine, decrease in amount of urine Pain, tingling, or numbness in the hands or feet Sudden and severe headache, confusion, change in vision, seizures, which may be signs of posterior reversible encephalopathy syndrome (PRES) Unusual bruising or bleeding Side effects that usually do not require medical attention (report to your care team if they continue or are bothersome): Diarrhea Nausea Pain, redness, or swelling with sores inside the mouth or throat Unusual weakness or fatigue Vomiting This list may not describe all possible side effects. Call your doctor for medical advice about side effects. You may report side effects to FDA at 1-800-FDA-1088. Where should I keep my medication? This medication is given in a hospital or clinic. It will not be stored at home. NOTE: This sheet is a summary. It may not cover all possible information. If you have questions about this medicine, talk to your doctor, pharmacist, or health care provider.  2023 Elsevier/Gold Standard (  2007-09-21 00:00:00)  Leucovorin Injection What is this medication? LEUCOVORIN (loo koe VOR in) prevents side effects from certain medications, such as methotrexate. It works by increasing folate levels. This helps protect healthy cells in your body. It may also be used to treat anemia caused by low levels of folate. It can also be used with fluorouracil, a type of chemotherapy, to treat colorectal cancer. It works by increasing the effects of fluorouracil in the body. This medicine may be used for other purposes; ask your health care provider or pharmacist if you have questions. What should I tell my care team before I take this medication? They need to know if you have any of these conditions: Anemia from low levels of vitamin B12 in the  blood An unusual or allergic reaction to leucovorin, folic acid, other medications, foods, dyes, or preservatives Pregnant or trying to get pregnant Breastfeeding How should I use this medication? This medication is injected into a vein or a muscle. It is given by your care team in a hospital or clinic setting. Talk to your care team about the use of this medication in children. Special care may be needed. Overdosage: If you think you have taken too much of this medicine contact a poison control center or emergency room at once. NOTE: This medicine is only for you. Do not share this medicine with others. What if I miss a dose? Keep appointments for follow-up doses. It is important not to miss your dose. Call your care team if you are unable to keep an appointment. What may interact with this medication? Capecitabine Fluorouracil Phenobarbital Phenytoin Primidone Trimethoprim;sulfamethoxazole This list may not describe all possible interactions. Give your health care provider a list of all the medicines, herbs, non-prescription drugs, or dietary supplements you use. Also tell them if you smoke, drink alcohol, or use illegal drugs. Some items may interact with your medicine. What should I watch for while using this medication? Your condition will be monitored carefully while you are receiving this medication. This medication may increase the side effects of 5-fluorouracil. Tell your care team if you have diarrhea or mouth sores that do not get better or that get worse. What side effects may I notice from receiving this medication? Side effects that you should report to your care team as soon as possible: Allergic reactions--skin rash, itching, hives, swelling of the face, lips, tongue, or throat This list may not describe all possible side effects. Call your doctor for medical advice about side effects. You may report side effects to FDA at 1-800-FDA-1088. Where should I keep my  medication? This medication is given in a hospital or clinic. It will not be stored at home. NOTE: This sheet is a summary. It may not cover all possible information. If you have questions about this medicine, talk to your doctor, pharmacist, or health care provider.  2023 Elsevier/Gold Standard (2021-12-09 00:00:00)  Fluorouracil Injection What is this medication? FLUOROURACIL (flure oh YOOR a sil) treats some types of cancer. It works by slowing down the growth of cancer cells. This medicine may be used for other purposes; ask your health care provider or pharmacist if you have questions. COMMON BRAND NAME(S): Adrucil What should I tell my care team before I take this medication? They need to know if you have any of these conditions: Blood disorders Dihydropyrimidine dehydrogenase (DPD) deficiency Infection, such as chickenpox, cold sores, herpes Kidney disease Liver disease Poor nutrition Recent or ongoing radiation therapy An unusual or allergic reaction to   fluorouracil, other medications, foods, dyes, or preservatives If you or your partner are pregnant or trying to get pregnant Breast-feeding How should I use this medication? This medication is injected into a vein. It is administered by your care team in a hospital or clinic setting. Talk to your care team about the use of this medication in children. Special care may be needed. Overdosage: If you think you have taken too much of this medicine contact a poison control center or emergency room at once. NOTE: This medicine is only for you. Do not share this medicine with others. What if I miss a dose? Keep appointments for follow-up doses. It is important not to miss your dose. Call your care team if you are unable to keep an appointment. What may interact with this medication? Do not take this medication with any of the following: Live virus vaccines This medication may also interact with the following: Medications that treat  or prevent blood clots, such as warfarin, enoxaparin, dalteparin This list may not describe all possible interactions. Give your health care provider a list of all the medicines, herbs, non-prescription drugs, or dietary supplements you use. Also tell them if you smoke, drink alcohol, or use illegal drugs. Some items may interact with your medicine. What should I watch for while using this medication? Your condition will be monitored carefully while you are receiving this medication. This medication may make you feel generally unwell. This is not uncommon as chemotherapy can affect healthy cells as well as cancer cells. Report any side effects. Continue your course of treatment even though you feel ill unless your care team tells you to stop. In some cases, you may be given additional medications to help with side effects. Follow all directions for their use. This medication may increase your risk of getting an infection. Call your care team for advice if you get a fever, chills, sore throat, or other symptoms of a cold or flu. Do not treat yourself. Try to avoid being around people who are sick. This medication may increase your risk to bruise or bleed. Call your care team if you notice any unusual bleeding. Be careful brushing or flossing your teeth or using a toothpick because you may get an infection or bleed more easily. If you have any dental work done, tell your dentist you are receiving this medication. Avoid taking medications that contain aspirin, acetaminophen, ibuprofen, naproxen, or ketoprofen unless instructed by your care team. These medications may hide a fever. Do not treat diarrhea with over the counter products. Contact your care team if you have diarrhea that lasts more than 2 days or if it is severe and watery. This medication can make you more sensitive to the sun. Keep out of the sun. If you cannot avoid being in the sun, wear protective clothing and sunscreen. Do not use sun lamps,  tanning beds, or tanning booths. Talk to your care team if you or your partner wish to become pregnant or think you might be pregnant. This medication can cause serious birth defects if taken during pregnancy and for 3 months after the last dose. A reliable form of contraception is recommended while taking this medication and for 3 months after the last dose. Talk to your care team about effective forms of contraception. Do not father a child while taking this medication and for 3 months after the last dose. Use a condom while having sex during this time period. Do not breastfeed while taking this medication. This medication   may cause infertility. Talk to your care team if you are concerned about your fertility. What side effects may I notice from receiving this medication? Side effects that you should report to your care team as soon as possible: Allergic reactions--skin rash, itching, hives, swelling of the face, lips, tongue, or throat Heart attack--pain or tightness in the chest, shoulders, arms, or jaw, nausea, shortness of breath, cold or clammy skin, feeling faint or lightheaded Heart failure--shortness of breath, swelling of the ankles, feet, or hands, sudden weight gain, unusual weakness or fatigue Heart rhythm changes--fast or irregular heartbeat, dizziness, feeling faint or lightheaded, chest pain, trouble breathing High ammonia level--unusual weakness or fatigue, confusion, loss of appetite, nausea, vomiting, seizures Infection--fever, chills, cough, sore throat, wounds that don't heal, pain or trouble when passing urine, general feeling of discomfort or being unwell Low red blood cell level--unusual weakness or fatigue, dizziness, headache, trouble breathing Pain, tingling, or numbness in the hands or feet, muscle weakness, change in vision, confusion or trouble speaking, loss of balance or coordination, trouble walking, seizures Redness, swelling, and blistering of the skin over hands and  feet Severe or prolonged diarrhea Unusual bruising or bleeding Side effects that usually do not require medical attention (report to your care team if they continue or are bothersome): Dry skin Headache Increased tears Nausea Pain, redness, or swelling with sores inside the mouth or throat Sensitivity to light Vomiting This list may not describe all possible side effects. Call your doctor for medical advice about side effects. You may report side effects to FDA at 1-800-FDA-1088. Where should I keep my medication? This medication is given in a hospital or clinic. It will not be stored at home. NOTE: This sheet is a summary. It may not cover all possible information. If you have questions about this medicine, talk to your doctor, pharmacist, or health care provider.  2023 Elsevier/Gold Standard (2021-11-29 00:00:00)  

## 2022-12-20 NOTE — Progress Notes (Signed)
  [facility] OFFICE PROGRESS NOTE   Diagnosis: Colon cancer  INTERVAL HISTORY:   [name] returns as scheduled.  She is seen today prior to resuming treatment for 3 additional months of FOLFOX.  She feels well.  Appetite described as "pretty good".  No nausea, vomiting, diarrhea.  No numbness or tingling in the hands or feet.  Objective:  Vital signs in last 24 hours:  Blood pressure 116/74, pulse 71, temperature 98.7 F (37.1 C), temperature source Oral, resp. rate 20, height 5\' 4"  (1.626 m), weight 138 lb 11.2 oz (62.9 kg), last menstrual period 03/28/2013, SpO2 100 %.    HEENT: No thrush or ulcers. Resp: Lungs clear bilaterally. Cardio: Regular rate and rhythm. GI: Abdomen soft and nontender.  Right abdominal surgical incision appears healed.  No hepatosplenomegaly. Vascular: No leg edema. Neuro: Vibratory sense intact over the fingertips. Skin: Palms without erythema. Port-A-Cath without erythema.  Lab Results:  Lab Results  Component Value Date   WBC 3.4 (L) 12/20/2022   HGB 10.8 (L) 12/20/2022   HCT 33.0 (L) 12/20/2022   MCV 97.6 12/20/2022   PLT 169 12/20/2022   NEUTROABS 1.8 12/20/2022    Imaging:  No results found.  Medications: I have reviewed the patient's current medications.  Assessment/Plan: Colon cancer, stage IIa (T3 N0 M0), sigmoidectomy 04/29/2021 Colonoscopy 03/24/2021-partially obstructing mass in the sigmoid colon, biopsy-adenocarcinoma CTs 03/28/2021-sigmoid colon mass, prominent presacral and sigmoid mesentery lymph nodes-nonspecific, no evidence of distant metastatic disease, sclerotic lesions at the left intertrochanteric region-likely a bone island Sigmoidectomy 04/29/2021-sigmoid colon tumor, 0/16 lymph nodes, no lymphovascular or perineural invasion, no loss of mismatch repair protein expression, MSS Foundation 1-MSS, tumor mutation burden 6, K-ras and NRAS wild-type, ERBB2 amplification-equivocal HER2 2+, negative by  FISH Colonoscopy 04/27/2022-polyps removed from the ascending and transverse colon-tubular adenomas 05/31/2022 CEA 8.54 06/28/2022 CEA 9.48 07/10/2022 CTs-new lesion central left hepatic lobe 07/16/2022 MRI 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 Seen by Dr. 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 Modesta Messing, tumor dissected off of the vessel treated with intraoperative argon Cycle 7 FOLFOX 12/20/2022   2.   Rectal bleeding, appointment with gastroenterology 09/25/2022-anal fissure per GI exam 09/11/2022.  Completed treatment for an anal fissure.  Flexible sigmoidoscopy 10/03/2022-moderate diverticulosis in the sigmoid colon and in the descending colon; patent end-to-side colocolonic anastomosis; anal papilla hypertrophied.  Rectal bleeding felt to be due to anal fissure.  Disposition: [name] appears stable.  She is scheduled to resume treatment with FOLFOX for an additional 3 months.  Plan to proceed with FOLFOX today as scheduled.  CBC and chemistry panel reviewed.  Labs adequate to proceed as above.  She will receive Udenyca on the day of pump discontinuation.  She will return for lab, follow-up, FOLFOX in 2 weeks.  We are available to see her sooner if needed.    Lonna Cobb ANP/GNP-BC   12/20/2022  9:41 AM

## 2022-12-20 NOTE — Patient Instructions (Signed)

## 2022-12-20 NOTE — Progress Notes (Signed)
Patient seen by Lisa Thomas NP today  Vitals are within treatment parameters.  Labs reviewed by Lisa Thomas NP and are within treatment parameters.  Per physician team, patient is ready for treatment and there are NO modifications to the treatment plan.     

## 2022-12-22 ENCOUNTER — Inpatient Hospital Stay: Payer: Medicare Other

## 2022-12-22 VITALS — BP 128/70 | HR 68 | Temp 98.3°F | Resp 20

## 2022-12-22 DIAGNOSIS — C187 Malignant neoplasm of sigmoid colon: Secondary | ICD-10-CM | POA: Diagnosis not present

## 2022-12-22 DIAGNOSIS — Z5189 Encounter for other specified aftercare: Secondary | ICD-10-CM | POA: Diagnosis not present

## 2022-12-22 DIAGNOSIS — Z5111 Encounter for antineoplastic chemotherapy: Secondary | ICD-10-CM | POA: Diagnosis not present

## 2022-12-22 DIAGNOSIS — Z85038 Personal history of other malignant neoplasm of large intestine: Secondary | ICD-10-CM

## 2022-12-22 MED ORDER — SODIUM CHLORIDE 0.9% FLUSH
10.0000 mL | INTRAVENOUS | Status: DC | PRN
  Administered 2022-12-22: 10 mL

## 2022-12-22 MED ORDER — PEGFILGRASTIM-CBQV 6 MG/0.6ML ~~LOC~~ SOSY
6.0000 mg | PREFILLED_SYRINGE | Freq: Once | SUBCUTANEOUS | Status: AC
  Administered 2022-12-22: 6 mg via SUBCUTANEOUS
  Filled 2022-12-22: qty 0.6

## 2022-12-22 MED ORDER — HEPARIN SOD (PORK) LOCK FLUSH 100 UNIT/ML IV SOLN
500.0000 [IU] | Freq: Once | INTRAVENOUS | Status: AC | PRN
  Administered 2022-12-22: 500 [IU]

## 2022-12-22 NOTE — Patient Instructions (Signed)

## 2022-12-27 ENCOUNTER — Encounter: Payer: Self-pay | Admitting: Internal Medicine

## 2022-12-27 MED ORDER — AMBULATORY NON FORMULARY MEDICATION: 0 refills | Status: DC

## 2023-01-03 ENCOUNTER — Inpatient Hospital Stay: Payer: Medicare Other

## 2023-01-03 ENCOUNTER — Encounter: Payer: Self-pay | Admitting: Nurse Practitioner

## 2023-01-03 ENCOUNTER — Inpatient Hospital Stay: Payer: Medicare Other | Admitting: Nurse Practitioner

## 2023-01-03 VITALS — BP 133/69 | HR 69 | Temp 98.1°F | Resp 18 | Ht 64.0 in | Wt 138.5 lb

## 2023-01-03 DIAGNOSIS — Z5189 Encounter for other specified aftercare: Secondary | ICD-10-CM | POA: Diagnosis not present

## 2023-01-03 DIAGNOSIS — Z85038 Personal history of other malignant neoplasm of large intestine: Secondary | ICD-10-CM | POA: Diagnosis not present

## 2023-01-03 DIAGNOSIS — C187 Malignant neoplasm of sigmoid colon: Secondary | ICD-10-CM | POA: Diagnosis not present

## 2023-01-03 DIAGNOSIS — Z5111 Encounter for antineoplastic chemotherapy: Secondary | ICD-10-CM | POA: Diagnosis not present

## 2023-01-03 LAB — CMP (CANCER CENTER ONLY)
ALT: 28 U/L (ref 0–44)
AST: 24 U/L (ref 15–41)
Albumin: 4.2 g/dL (ref 3.5–5.0)
Alkaline Phosphatase: 86 U/L (ref 38–126)
Anion gap: 7 (ref 5–15)
BUN: 9 mg/dL (ref 8–23)
CO2: 27 mmol/L (ref 22–32)
Calcium: 9 mg/dL (ref 8.9–10.3)
Chloride: 106 mmol/L (ref 98–111)
Creatinine: 0.69 mg/dL (ref 0.44–1.00)
GFR, Estimated: >60 mL/min (ref >60)
Glucose, Bld: 98 mg/dL (ref 70–99)
Potassium: 3.7 mmol/L (ref 3.5–5.1)
Sodium: 140 mmol/L (ref 135–145)
Total Bilirubin: 0.4 mg/dL (ref 0.3–1.2)
Total Protein: 6.5 g/dL (ref 6.5–8.1)

## 2023-01-03 LAB — CBC WITH DIFFERENTIAL (CANCER CENTER ONLY)
Abs Immature Granulocytes: 0.06 10*3/uL (ref 0.00–0.07)
Basophils Absolute: 0 10*3/uL (ref 0.0–0.1)
Basophils Relative: 0 %
Eosinophils Absolute: 0 10*3/uL (ref 0.0–0.5)
Eosinophils Relative: 1 %
HCT: 33.6 % — ABNORMAL LOW (ref 36.0–46.0)
Hemoglobin: 11 g/dL — ABNORMAL LOW (ref 12.0–15.0)
Immature Granulocytes: 1 %
Lymphocytes Relative: 26 %
Lymphs Abs: 1.4 10*3/uL (ref 0.7–4.0)
MCH: 31.3 pg (ref 26.0–34.0)
MCHC: 32.7 g/dL (ref 30.0–36.0)
MCV: 95.7 fL (ref 80.0–100.0)
Monocytes Absolute: 0.5 10*3/uL (ref 0.1–1.0)
Monocytes Relative: 10 %
Neutro Abs: 3.4 10*3/uL (ref 1.7–7.7)
Neutrophils Relative %: 62 %
Platelet Count: 140 10*3/uL — ABNORMAL LOW (ref 150–400)
RBC: 3.51 MIL/uL — ABNORMAL LOW (ref 3.87–5.11)
RDW: 12.6 % (ref 11.5–15.5)
WBC Count: 5.5 10*3/uL (ref 4.0–10.5)
nRBC: 0 % (ref 0.0–0.2)

## 2023-01-03 LAB — CEA (ACCESS): CEA (CHCC): <1 ng/mL (ref 0.00–5.00)

## 2023-01-03 MED ORDER — SODIUM CHLORIDE 0.9 % IV SOLN
10.0000 mg | Freq: Once | INTRAVENOUS | Status: AC
  Administered 2023-01-03: 10 mg via INTRAVENOUS
  Filled 2023-01-03: qty 10

## 2023-01-03 MED ORDER — PALONOSETRON HCL INJECTION 0.25 MG/5ML
0.2500 mg | Freq: Once | INTRAVENOUS | Status: AC
  Administered 2023-01-03: 0.25 mg via INTRAVENOUS
  Filled 2023-01-03: qty 5

## 2023-01-03 MED ORDER — OXALIPLATIN CHEMO INJECTION 100 MG/20ML
85.0000 mg/m2 | Freq: Once | INTRAVENOUS | Status: AC
  Administered 2023-01-03: 150 mg via INTRAVENOUS
  Filled 2023-01-03: qty 20

## 2023-01-03 MED ORDER — FLUOROURACIL CHEMO INJECTION 2.5 GM/50ML
400.0000 mg/m2 | Freq: Once | INTRAVENOUS | Status: AC
  Administered 2023-01-03: 700 mg via INTRAVENOUS
  Filled 2023-01-03: qty 14

## 2023-01-03 MED ORDER — LEUCOVORIN CALCIUM INJECTION 350 MG
400.0000 mg/m2 | Freq: Once | INTRAVENOUS | Status: AC
  Administered 2023-01-03: 676 mg via INTRAVENOUS
  Filled 2023-01-03: qty 33.8

## 2023-01-03 MED ORDER — DEXTROSE 5 % IV SOLN: Freq: Once | INTRAVENOUS | Status: AC

## 2023-01-03 MED ORDER — SODIUM CHLORIDE 0.9 % IV SOLN
2400.0000 mg/m2 | INTRAVENOUS | Status: DC
  Administered 2023-01-03: 4050 mg via INTRAVENOUS
  Filled 2023-01-03: qty 81

## 2023-01-03 NOTE — Patient Instructions (Addendum)
[place] [facility]   The chemotherapy medication bag should finish at 46 hours, 96 hours, or 7 days. For example, if your pump is scheduled for 46 hours and it was put on at 4:00 p.m., it should finish at 2:00 p.m. the day it is scheduled to come off regardless of your appointment time.     Estimated time to finish at 11:30am Friday May 24th, 2024.   If the display on your pump reads "Low Volume" and it is beeping, take the batteries out of the pump and come to the cancer center for it to be taken off.   If the pump alarms go off prior to the pump reading "Low Volume" then call [phone] and someone can assist you.  If the plunger comes out and the chemotherapy medication is leaking out, please use your home chemo spill kit to clean up the spill. Do NOT use paper towels or other household products.  If you have problems or questions regarding your pump, please call either [phone] (24 hours a day) or the cancer center Monday-Friday 8:00 a.m.- 4:30 p.m. at the clinic number and we will assist you. If you are unable to get assistance, then go to the nearest Emergency Department and ask the staff to contact the IV team for assistance.  Discharge Instructions: Thank you for choosing [facility] to provide your oncology and hematology care.   If you have a lab appointment with the Cancer Center, please go directly to the Cancer Center and check in at the registration area.   Wear comfortable clothing and clothing appropriate for easy access to any Portacath or PICC line.   We strive to give you quality time with your provider. You may need to reschedule your appointment if you arrive late (15 or more minutes).  Arriving late affects you and other patients whose appointments are after yours.  Also, if you miss three or more appointments without notifying the office, you may be dismissed from the clinic at the provider's discretion.      For  prescription refill requests, have your pharmacy contact our office and allow 72 hours for refills to be completed.    Today you received the following chemotherapy and/or immunotherapy agents Oxaliplatin, Leucovorin, Fluorouracil.      To help prevent nausea and vomiting after your treatment, we encourage you to take your nausea medication as directed.  BELOW ARE SYMPTOMS THAT SHOULD BE REPORTED IMMEDIATELY: *FEVER GREATER THAN 100.4 F (38 C) OR HIGHER *CHILLS OR SWEATING *NAUSEA AND VOMITING THAT IS NOT CONTROLLED WITH YOUR NAUSEA MEDICATION *UNUSUAL SHORTNESS OF BREATH *UNUSUAL BRUISING OR BLEEDING *URINARY PROBLEMS (pain or burning when urinating, or frequent urination) *BOWEL PROBLEMS (unusual diarrhea, constipation, pain near the anus) TENDERNESS IN MOUTH AND THROAT WITH OR WITHOUT PRESENCE OF ULCERS (sore throat, sores in mouth, or a toothache) UNUSUAL RASH, SWELLING OR PAIN  UNUSUAL VAGINAL DISCHARGE OR ITCHING   Items with * indicate a potential emergency and should be followed up as soon as possible or go to the Emergency Department if any problems should occur.  Please show the CHEMOTHERAPY ALERT CARD or IMMUNOTHERAPY ALERT CARD at check-in to the Emergency Department and triage nurse.  Should you have questions after your visit or need to cancel or reschedule your appointment, please contact [place] [facility]  Dept: [phone]  and follow the prompts.  Office hours are 8:00 a.m. to 4:30 p.m. Monday - Friday. Please note  that voicemails left after 4:00 p.m. may not be returned until the following business day.  We are closed weekends and major holidays. You have access to a nurse at all times for urgent questions. Please call the main number to the clinic Dept: [phone] and follow the prompts.   For any non-urgent questions, you may also contact your provider using MyChart. We now offer e-Visits for anyone 25 and older to request care online  for non-urgent symptoms. For details visit [email or web address].   Also download the MyChart app! Go to the app store, search "MyChart", open the app, select , and log in with your MyChart username and password.  Oxaliplatin Injection What is this medication? OXALIPLATIN (ox AL i PLA tin) treats colorectal cancer. It works by slowing down the growth of cancer cells. This medicine may be used for other purposes; ask your health care provider or pharmacist if you have questions. COMMON BRAND NAME(S): Eloxatin What should I tell my care team before I take this medication? They need to know if you have any of these conditions: Heart disease History of irregular heartbeat or rhythm Liver disease Low blood cell levels (white cells, red cells, and platelets) Lung or breathing disease, such as asthma Take medications that treat or prevent blood clots Tingling of the fingers, toes, or other nerve disorder An unusual or allergic reaction to oxaliplatin, other medications, foods, dyes, or preservatives If you or your partner are pregnant or trying to get pregnant Breast-feeding How should I use this medication? This medication is injected into a vein. It is given by your care team in a hospital or clinic setting. Talk to your care team about the use of this medication in children. Special care may be needed. Overdosage: If you think you have taken too much of this medicine contact a poison control center or emergency room at once. NOTE: This medicine is only for you. Do not share this medicine with others. What if I miss a dose? Keep appointments for follow-up doses. It is important not to miss a dose. Call your care team if you are unable to keep an appointment. What may interact with this medication? Do not take this medication with any of the following: Cisapride Dronedarone Pimozide Thioridazine This medication may also interact with the following: Aspirin and aspirin-like  medications Certain medications that treat or prevent blood clots, such as warfarin, apixaban, dabigatran, and rivaroxaban Cisplatin Cyclosporine Diuretics Medications for infection, such as acyclovir, adefovir, amphotericin B, bacitracin, cidofovir, foscarnet, ganciclovir, gentamicin, pentamidine, vancomycin NSAIDs, medications for pain and inflammation, such as ibuprofen or naproxen Other medications that cause heart rhythm changes Pamidronate Zoledronic acid This list may not describe all possible interactions. Give your health care provider a list of all the medicines, herbs, non-prescription drugs, or dietary supplements you use. Also tell them if you smoke, drink alcohol, or use illegal drugs. Some items may interact with your medicine. What should I watch for while using this medication? Your condition will be monitored carefully while you are receiving this medication. You may need blood work while taking this medication. This medication may make you feel generally unwell. This is not uncommon as chemotherapy can affect healthy cells as well as cancer cells. Report any side effects. Continue your course of treatment even though you feel ill unless your care team tells you to stop. This medication may increase your risk of getting an infection. Call your care team for advice if you get a fever, chills, sore throat, or  other symptoms of a cold or flu. Do not treat yourself. Try to avoid being around people who are sick. Avoid taking medications that contain aspirin, acetaminophen, ibuprofen, naproxen, or ketoprofen unless instructed by your care team. These medications may hide a fever. Be careful brushing or flossing your teeth or using a toothpick because you may get an infection or bleed more easily. If you have any dental work done, tell your dentist you are receiving this medication. This medication can make you more sensitive to cold. Do not drink cold drinks or use ice. Cover exposed  skin before coming in contact with cold temperatures or cold objects. When out in cold weather wear warm clothing and cover your mouth and nose to warm the air that goes into your lungs. Tell your care team if you get sensitive to the cold. Talk to your care team if you or your partner are pregnant or think either of you might be pregnant. This medication can cause serious birth defects if taken during pregnancy and for 9 months after the last dose. A negative pregnancy test is required before starting this medication. A reliable form of contraception is recommended while taking this medication and for 9 months after the last dose. Talk to your care team about effective forms of contraception. Do not father a child while taking this medication and for 6 months after the last dose. Use a condom while having sex during this time period. Do not breastfeed while taking this medication and for 3 months after the last dose. This medication may cause infertility. Talk to your care team if you are concerned about your fertility. What side effects may I notice from receiving this medication? Side effects that you should report to your care team as soon as possible: Allergic reactions--skin rash, itching, hives, swelling of the face, lips, tongue, or throat Bleeding--bloody or black, tar-like stools, vomiting blood or brown material that looks like coffee grounds, red or dark brown urine, small red or purple spots on skin, unusual bruising or bleeding Dry cough, shortness of breath or trouble breathing Heart rhythm changes--fast or irregular heartbeat, dizziness, feeling faint or lightheaded, chest pain, trouble breathing Infection--fever, chills, cough, sore throat, wounds that don't heal, pain or trouble when passing urine, general feeling of discomfort or being unwell Liver injury--right upper belly pain, loss of appetite, nausea, light-colored stool, dark yellow or brown urine, yellowing skin or eyes, unusual  weakness or fatigue Low red blood cell level--unusual weakness or fatigue, dizziness, headache, trouble breathing Muscle injury--unusual weakness or fatigue, muscle pain, dark yellow or brown urine, decrease in amount of urine Pain, tingling, or numbness in the hands or feet Sudden and severe headache, confusion, change in vision, seizures, which may be signs of posterior reversible encephalopathy syndrome (PRES) Unusual bruising or bleeding Side effects that usually do not require medical attention (report to your care team if they continue or are bothersome): Diarrhea Nausea Pain, redness, or swelling with sores inside the mouth or throat Unusual weakness or fatigue Vomiting This list may not describe all possible side effects. Call your doctor for medical advice about side effects. You may report side effects to FDA at 1-800-FDA-1088. Where should I keep my medication? This medication is given in a hospital or clinic. It will not be stored at home. NOTE: This sheet is a summary. It may not cover all possible information. If you have questions about this medicine, talk to your doctor, pharmacist, or health care provider.  2023 Elsevier/Gold Standard (  2007-09-21 00:00:00)  Leucovorin Injection What is this medication? LEUCOVORIN (loo koe VOR in) prevents side effects from certain medications, such as methotrexate. It works by increasing folate levels. This helps protect healthy cells in your body. It may also be used to treat anemia caused by low levels of folate. It can also be used with fluorouracil, a type of chemotherapy, to treat colorectal cancer. It works by increasing the effects of fluorouracil in the body. This medicine may be used for other purposes; ask your health care provider or pharmacist if you have questions. What should I tell my care team before I take this medication? They need to know if you have any of these conditions: Anemia from low levels of vitamin B12 in the  blood An unusual or allergic reaction to leucovorin, folic acid, other medications, foods, dyes, or preservatives Pregnant or trying to get pregnant Breastfeeding How should I use this medication? This medication is injected into a vein or a muscle. It is given by your care team in a hospital or clinic setting. Talk to your care team about the use of this medication in children. Special care may be needed. Overdosage: If you think you have taken too much of this medicine contact a poison control center or emergency room at once. NOTE: This medicine is only for you. Do not share this medicine with others. What if I miss a dose? Keep appointments for follow-up doses. It is important not to miss your dose. Call your care team if you are unable to keep an appointment. What may interact with this medication? Capecitabine Fluorouracil Phenobarbital Phenytoin Primidone Trimethoprim;sulfamethoxazole This list may not describe all possible interactions. Give your health care provider a list of all the medicines, herbs, non-prescription drugs, or dietary supplements you use. Also tell them if you smoke, drink alcohol, or use illegal drugs. Some items may interact with your medicine. What should I watch for while using this medication? Your condition will be monitored carefully while you are receiving this medication. This medication may increase the side effects of 5-fluorouracil. Tell your care team if you have diarrhea or mouth sores that do not get better or that get worse. What side effects may I notice from receiving this medication? Side effects that you should report to your care team as soon as possible: Allergic reactions--skin rash, itching, hives, swelling of the face, lips, tongue, or throat This list may not describe all possible side effects. Call your doctor for medical advice about side effects. You may report side effects to FDA at 1-800-FDA-1088. Where should I keep my  medication? This medication is given in a hospital or clinic. It will not be stored at home. NOTE: This sheet is a summary. It may not cover all possible information. If you have questions about this medicine, talk to your doctor, pharmacist, or health care provider.  2023 Elsevier/Gold Standard (2021-12-09 00:00:00)  Fluorouracil Injection What is this medication? FLUOROURACIL (flure oh YOOR a sil) treats some types of cancer. It works by slowing down the growth of cancer cells. This medicine may be used for other purposes; ask your health care provider or pharmacist if you have questions. COMMON BRAND NAME(S): Adrucil What should I tell my care team before I take this medication? They need to know if you have any of these conditions: Blood disorders Dihydropyrimidine dehydrogenase (DPD) deficiency Infection, such as chickenpox, cold sores, herpes Kidney disease Liver disease Poor nutrition Recent or ongoing radiation therapy An unusual or allergic reaction to  fluorouracil, other medications, foods, dyes, or preservatives If you or your partner are pregnant or trying to get pregnant Breast-feeding How should I use this medication? This medication is injected into a vein. It is administered by your care team in a hospital or clinic setting. Talk to your care team about the use of this medication in children. Special care may be needed. Overdosage: If you think you have taken too much of this medicine contact a poison control center or emergency room at once. NOTE: This medicine is only for you. Do not share this medicine with others. What if I miss a dose? Keep appointments for follow-up doses. It is important not to miss your dose. Call your care team if you are unable to keep an appointment. What may interact with this medication? Do not take this medication with any of the following: Live virus vaccines This medication may also interact with the following: Medications that treat  or prevent blood clots, such as warfarin, enoxaparin, dalteparin This list may not describe all possible interactions. Give your health care provider a list of all the medicines, herbs, non-prescription drugs, or dietary supplements you use. Also tell them if you smoke, drink alcohol, or use illegal drugs. Some items may interact with your medicine. What should I watch for while using this medication? Your condition will be monitored carefully while you are receiving this medication. This medication may make you feel generally unwell. This is not uncommon as chemotherapy can affect healthy cells as well as cancer cells. Report any side effects. Continue your course of treatment even though you feel ill unless your care team tells you to stop. In some cases, you may be given additional medications to help with side effects. Follow all directions for their use. This medication may increase your risk of getting an infection. Call your care team for advice if you get a fever, chills, sore throat, or other symptoms of a cold or flu. Do not treat yourself. Try to avoid being around people who are sick. This medication may increase your risk to bruise or bleed. Call your care team if you notice any unusual bleeding. Be careful brushing or flossing your teeth or using a toothpick because you may get an infection or bleed more easily. If you have any dental work done, tell your dentist you are receiving this medication. Avoid taking medications that contain aspirin, acetaminophen, ibuprofen, naproxen, or ketoprofen unless instructed by your care team. These medications may hide a fever. Do not treat diarrhea with over the counter products. Contact your care team if you have diarrhea that lasts more than 2 days or if it is severe and watery. This medication can make you more sensitive to the sun. Keep out of the sun. If you cannot avoid being in the sun, wear protective clothing and sunscreen. Do not use sun lamps,  tanning beds, or tanning booths. Talk to your care team if you or your partner wish to become pregnant or think you might be pregnant. This medication can cause serious birth defects if taken during pregnancy and for 3 months after the last dose. A reliable form of contraception is recommended while taking this medication and for 3 months after the last dose. Talk to your care team about effective forms of contraception. Do not father a child while taking this medication and for 3 months after the last dose. Use a condom while having sex during this time period. Do not breastfeed while taking this medication. This medication  may cause infertility. Talk to your care team if you are concerned about your fertility. What side effects may I notice from receiving this medication? Side effects that you should report to your care team as soon as possible: Allergic reactions--skin rash, itching, hives, swelling of the face, lips, tongue, or throat Heart attack--pain or tightness in the chest, shoulders, arms, or jaw, nausea, shortness of breath, cold or clammy skin, feeling faint or lightheaded Heart failure--shortness of breath, swelling of the ankles, feet, or hands, sudden weight gain, unusual weakness or fatigue Heart rhythm changes--fast or irregular heartbeat, dizziness, feeling faint or lightheaded, chest pain, trouble breathing High ammonia level--unusual weakness or fatigue, confusion, loss of appetite, nausea, vomiting, seizures Infection--fever, chills, cough, sore throat, wounds that don't heal, pain or trouble when passing urine, general feeling of discomfort or being unwell Low red blood cell level--unusual weakness or fatigue, dizziness, headache, trouble breathing Pain, tingling, or numbness in the hands or feet, muscle weakness, change in vision, confusion or trouble speaking, loss of balance or coordination, trouble walking, seizures Redness, swelling, and blistering of the skin over hands and  feet Severe or prolonged diarrhea Unusual bruising or bleeding Side effects that usually do not require medical attention (report to your care team if they continue or are bothersome): Dry skin Headache Increased tears Nausea Pain, redness, or swelling with sores inside the mouth or throat Sensitivity to light Vomiting This list may not describe all possible side effects. Call your doctor for medical advice about side effects. You may report side effects to FDA at 1-800-FDA-1088. Where should I keep my medication? This medication is given in a hospital or clinic. It will not be stored at home. NOTE: This sheet is a summary. It may not cover all possible information. If you have questions about this medicine, talk to your doctor, pharmacist, or health care provider.  2023 Elsevier/Gold Standard (2021-11-29 00:00:00)

## 2023-01-03 NOTE — Progress Notes (Signed)
Patient seen by Lisa Thomas NP today  Vitals are within treatment parameters.  Labs reviewed by Lisa Thomas NP and are within treatment parameters.  Per physician team, patient is ready for treatment and there are NO modifications to the treatment plan.     

## 2023-01-03 NOTE — Patient Instructions (Signed)

## 2023-01-03 NOTE — Progress Notes (Signed)
  [facility] OFFICE PROGRESS NOTE   Diagnosis: Colon cancer  INTERVAL HISTORY:   Joan Owen returns as scheduled.  She resumed treatment with FOLFOX 12/20/2022.  She denies nausea/vomiting.  No mouth sores.  No diarrhea.  She did not experience cold sensitivity.  Objective:  Vital signs in last 24 hours:  Blood pressure 133/69, pulse 69, temperature 98.1 F (36.7 C), resp. rate 18, height 5\' 4"  (1.626 m), weight 138 lb 8 oz (62.8 kg), last menstrual period 03/28/2013, SpO2 100 %.    HEENT: No thrush or ulcers. Resp: Lungs clear bilaterally. Cardio: Regular rate and rhythm. GI: No hepatosplenomegaly. Vascular: No leg edema. Neuro: Vibratory sense intact over the fingertips per tuning fork exam. Skin: Palms without erythema. Port-A-Cath without erythema.  Lab Results:  Lab Results  Component Value Date   WBC 5.5 01/03/2023   HGB 11.0 (L) 01/03/2023   HCT 33.6 (L) 01/03/2023   MCV 95.7 01/03/2023   PLT 140 (L) 01/03/2023   NEUTROABS 3.4 01/03/2023    Imaging:  No results found.  Medications: I have reviewed the patient's current medications.  Assessment/Plan: Colon cancer, stage IIa (T3 N0 M0), sigmoidectomy 04/29/2021 Colonoscopy 03/24/2021-partially obstructing mass in the sigmoid colon, biopsy-adenocarcinoma CTs 03/28/2021-sigmoid colon mass, prominent presacral and sigmoid mesentery lymph nodes-nonspecific, no evidence of distant metastatic disease, sclerotic lesions at the left intertrochanteric region-likely a bone island Sigmoidectomy 04/29/2021-sigmoid colon tumor, 0/16 lymph nodes, no lymphovascular or perineural invasion, no loss of mismatch repair protein expression, MSS Foundation 1-MSS, tumor mutation burden 6, K-ras and NRAS wild-type, ERBB2 amplification-equivocal HER2 2+, negative by FISH Colonoscopy 04/27/2022-polyps removed from the ascending and transverse colon-tubular adenomas 05/31/2022 CEA 8.54 06/28/2022 CEA 9.48 07/10/2022 CTs-new  lesion central left hepatic lobe 07/16/2022 MRI 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 Seen by Dr. Modesta Messing with recommendation for a 72-month course of chemotherapy prior to surgery Cycle 1 FOLFOX 08/09/2022 Cycle 2 FOLFOX 08/23/2022 Cycle 3 FOLFOX 09/06/2022 Cycle 4 FOLFOX 09/20/2022 Cycle 5 FOLFOX 10/04/2022 Cycle 6 FOLFOX 10/18/2022 Segment 4A/8 partial hepatectomy 11/14/2022-moderately differentiated adenocarcinoma compatible with a colorectal metastasis, 70% tumor necrosis, resection margin positive, R1 vascular margin per Dr. Modesta Messing, tumor dissected off of the vessel treated with intraoperative argon Cycle 7 FOLFOX 12/20/2022 Cycle 8 FOLFOX 01/03/2023   2.   Rectal bleeding, appointment with gastroenterology 09/25/2022-anal fissure per GI exam 09/11/2022.  Completed treatment for an anal fissure.  Flexible sigmoidoscopy 10/03/2022-moderate diverticulosis in the sigmoid colon and in the descending colon; patent end-to-side colocolonic anastomosis; anal papilla hypertrophied.  Rectal bleeding felt to be due to anal fissure.  Disposition: Joan Owen appears stable.  She resumed treatment with FOLFOX 2 weeks ago, cycle 7.  She tolerated well.  Plan to proceed with cycle 8 today as scheduled.  CBC and chemistry panel reviewed.  Labs adequate to proceed as above.  She will return for lab, follow-up, cycle 9 FOLFOX in 3 weeks rather than 2 due to a planned vacation.    Lonna Cobb ANP/GNP-BC   01/03/2023  9:38 AM

## 2023-01-05 ENCOUNTER — Inpatient Hospital Stay: Payer: Medicare Other

## 2023-01-05 VITALS — BP 116/78 | HR 70 | Temp 97.7°F | Resp 18

## 2023-01-05 DIAGNOSIS — Z5189 Encounter for other specified aftercare: Secondary | ICD-10-CM | POA: Diagnosis not present

## 2023-01-05 DIAGNOSIS — Z85038 Personal history of other malignant neoplasm of large intestine: Secondary | ICD-10-CM

## 2023-01-05 DIAGNOSIS — C187 Malignant neoplasm of sigmoid colon: Secondary | ICD-10-CM | POA: Diagnosis not present

## 2023-01-05 DIAGNOSIS — Z5111 Encounter for antineoplastic chemotherapy: Secondary | ICD-10-CM | POA: Diagnosis not present

## 2023-01-05 MED ORDER — PEGFILGRASTIM-CBQV 6 MG/0.6ML ~~LOC~~ SOSY
6.0000 mg | PREFILLED_SYRINGE | Freq: Once | SUBCUTANEOUS | Status: AC
  Administered 2023-01-05: 6 mg via SUBCUTANEOUS
  Filled 2023-01-05: qty 0.6

## 2023-01-05 MED ORDER — HEPARIN SOD (PORK) LOCK FLUSH 100 UNIT/ML IV SOLN
500.0000 [IU] | Freq: Once | INTRAVENOUS | Status: AC | PRN
  Administered 2023-01-05: 500 [IU]

## 2023-01-05 MED ORDER — SODIUM CHLORIDE 0.9% FLUSH
10.0000 mL | INTRAVENOUS | Status: DC | PRN
  Administered 2023-01-05: 10 mL

## 2023-01-05 NOTE — Patient Instructions (Signed)

## 2023-01-07 ENCOUNTER — Encounter: Payer: Self-pay | Admitting: Oncology

## 2023-01-09 ENCOUNTER — Telehealth: Payer: Self-pay | Admitting: *Deleted

## 2023-01-09 MED ORDER — DEXAMETHASONE 4 MG PO TABS: 4.0000 mg | ORAL_TABLET | Freq: Two times a day (BID) | ORAL | 0 refills | Status: DC

## 2023-01-09 NOTE — Telephone Encounter (Signed)
Informed patient that Dr. Truett Perna ordered dexamethasone 4 mg bid x 3 days and script sent to [facility] as requested. Continue Imodium. Call if diarrhea not controlled by tomorrow using Imodium. Patient understands and agrees.

## 2023-01-09 NOTE — Telephone Encounter (Signed)
Reports delayed nausea beginning 5/24 (day 2)-taking compazine bid and zofran bid with minimal relief. Has vomited x 1 on 5/27.  Started having diarrhea on Sunday night several times/day, but has not taken anything. Instructed her to start Imodium #2 tablets qid up to 8/day and call if not resolved in 24 hours. She reports she is getting adequate oral fluid intake.  Denies any fever or other family members w/similar symptoms.

## 2023-01-15 ENCOUNTER — Encounter: Payer: Self-pay | Admitting: *Deleted

## 2023-01-15 NOTE — Progress Notes (Signed)
Refaxed attending physician statement, medication list, office notes from 4/7 - 01/10/23 and treatment plan to [facility] [phone] for claim # [id number].  Received fax confirmation.

## 2023-01-21 ENCOUNTER — Other Ambulatory Visit: Payer: Self-pay | Admitting: Oncology

## 2023-01-21 DIAGNOSIS — Z85038 Personal history of other malignant neoplasm of large intestine: Secondary | ICD-10-CM

## 2023-01-24 ENCOUNTER — Telehealth: Payer: Self-pay | Admitting: Oncology

## 2023-01-24 ENCOUNTER — Encounter: Payer: Self-pay | Admitting: *Deleted

## 2023-01-24 ENCOUNTER — Inpatient Hospital Stay: Payer: Medicare Other

## 2023-01-24 ENCOUNTER — Encounter: Payer: Self-pay | Admitting: Oncology

## 2023-01-24 ENCOUNTER — Inpatient Hospital Stay: Payer: Medicare Other | Admitting: Oncology

## 2023-01-24 ENCOUNTER — Inpatient Hospital Stay: Payer: Medicare Other | Attending: Oncology

## 2023-01-24 ENCOUNTER — Other Ambulatory Visit (HOSPITAL_BASED_OUTPATIENT_CLINIC_OR_DEPARTMENT_OTHER): Payer: Self-pay

## 2023-01-24 VITALS — BP 113/60 | HR 71 | Resp 18

## 2023-01-24 VITALS — BP 108/63 | HR 61 | Temp 98.1°F | Resp 18 | Ht 64.0 in | Wt 137.1 lb

## 2023-01-24 DIAGNOSIS — Z85038 Personal history of other malignant neoplasm of large intestine: Secondary | ICD-10-CM | POA: Diagnosis not present

## 2023-01-24 DIAGNOSIS — Z5111 Encounter for antineoplastic chemotherapy: Secondary | ICD-10-CM | POA: Diagnosis not present

## 2023-01-24 DIAGNOSIS — Z5189 Encounter for other specified aftercare: Secondary | ICD-10-CM | POA: Diagnosis not present

## 2023-01-24 DIAGNOSIS — C187 Malignant neoplasm of sigmoid colon: Secondary | ICD-10-CM | POA: Diagnosis not present

## 2023-01-24 LAB — CBC WITH DIFFERENTIAL (CANCER CENTER ONLY)
Abs Immature Granulocytes: 0.04 10*3/uL (ref 0.00–0.07)
Basophils Absolute: 0 10*3/uL (ref 0.0–0.1)
Basophils Relative: 1 %
Eosinophils Absolute: 0 10*3/uL (ref 0.0–0.5)
Eosinophils Relative: 1 %
HCT: 32.4 % — ABNORMAL LOW (ref 36.0–46.0)
Hemoglobin: 10.4 g/dL — ABNORMAL LOW (ref 12.0–15.0)
Immature Granulocytes: 1 %
Lymphocytes Relative: 24 %
Lymphs Abs: 1 10*3/uL (ref 0.7–4.0)
MCH: 31 pg (ref 26.0–34.0)
MCHC: 32.1 g/dL (ref 30.0–36.0)
MCV: 96.4 fL (ref 80.0–100.0)
Monocytes Absolute: 0.5 10*3/uL (ref 0.1–1.0)
Monocytes Relative: 13 %
Neutro Abs: 2.5 10*3/uL (ref 1.7–7.7)
Neutrophils Relative %: 60 %
Platelet Count: 177 10*3/uL (ref 150–400)
RBC: 3.36 MIL/uL — ABNORMAL LOW (ref 3.87–5.11)
RDW: 14.8 % (ref 11.5–15.5)
WBC Count: 4.1 10*3/uL (ref 4.0–10.5)
nRBC: 0 % (ref 0.0–0.2)

## 2023-01-24 LAB — CMP (CANCER CENTER ONLY)
ALT: 45 U/L — ABNORMAL HIGH (ref 0–44)
AST: 33 U/L (ref 15–41)
Albumin: 3.8 g/dL (ref 3.5–5.0)
Alkaline Phosphatase: 77 U/L (ref 38–126)
Anion gap: 7 (ref 5–15)
BUN: 16 mg/dL (ref 8–23)
CO2: 27 mmol/L (ref 22–32)
Calcium: 8.9 mg/dL (ref 8.9–10.3)
Chloride: 105 mmol/L (ref 98–111)
Creatinine: 0.66 mg/dL (ref 0.44–1.00)
GFR, Estimated: >60 mL/min (ref >60)
Glucose, Bld: 100 mg/dL — ABNORMAL HIGH (ref 70–99)
Potassium: 4 mmol/L (ref 3.5–5.1)
Sodium: 139 mmol/L (ref 135–145)
Total Bilirubin: 0.4 mg/dL (ref 0.3–1.2)
Total Protein: 6 g/dL — ABNORMAL LOW (ref 6.5–8.1)

## 2023-01-24 LAB — CEA (ACCESS): CEA (CHCC): 1.32 ng/mL (ref 0.00–5.00)

## 2023-01-24 MED ORDER — LEUCOVORIN CALCIUM INJECTION 350 MG
400.0000 mg/m2 | Freq: Once | INTRAVENOUS | Status: AC
  Administered 2023-01-24: 676 mg via INTRAVENOUS
  Filled 2023-01-24: qty 33.8

## 2023-01-24 MED ORDER — PALONOSETRON HCL INJECTION 0.25 MG/5ML
0.2500 mg | Freq: Once | INTRAVENOUS | Status: AC
  Administered 2023-01-24: 0.25 mg via INTRAVENOUS
  Filled 2023-01-24: qty 5

## 2023-01-24 MED ORDER — SODIUM CHLORIDE 0.9 % IV SOLN
10.0000 mg | Freq: Once | INTRAVENOUS | Status: AC
  Administered 2023-01-24: 10 mg via INTRAVENOUS
  Filled 2023-01-24: qty 10

## 2023-01-24 MED ORDER — LORAZEPAM 0.5 MG PO TABS: 0.5000 mg | ORAL_TABLET | Freq: Four times a day (QID) | ORAL | 1 refills | Status: DC | PRN

## 2023-01-24 MED ORDER — OXALIPLATIN CHEMO INJECTION 100 MG/20ML
85.0000 mg/m2 | Freq: Once | INTRAVENOUS | Status: AC
  Administered 2023-01-24: 150 mg via INTRAVENOUS
  Filled 2023-01-24: qty 20

## 2023-01-24 MED ORDER — SODIUM CHLORIDE 0.9 % IV SOLN
2400.0000 mg/m2 | INTRAVENOUS | Status: DC
  Administered 2023-01-24: 4050 mg via INTRAVENOUS
  Filled 2023-01-24: qty 81

## 2023-01-24 MED ORDER — FLUOROURACIL CHEMO INJECTION 2.5 GM/50ML
400.0000 mg/m2 | Freq: Once | INTRAVENOUS | Status: AC
  Administered 2023-01-24: 700 mg via INTRAVENOUS
  Filled 2023-01-24: qty 14

## 2023-01-24 MED ORDER — LORAZEPAM 0.5 MG PO TABS
0.5000 mg | ORAL_TABLET | Freq: Four times a day (QID) | ORAL | 1 refills | Status: DC | PRN
  Filled 2023-01-24: qty 30, 8d supply, fill #0

## 2023-01-24 MED ORDER — SODIUM CHLORIDE 0.9 % IV SOLN
150.0000 mg | Freq: Once | INTRAVENOUS | Status: AC
  Administered 2023-01-24: 150 mg via INTRAVENOUS
  Filled 2023-01-24: qty 150

## 2023-01-24 MED ORDER — DEXTROSE 5 % IV SOLN: Freq: Once | INTRAVENOUS | Status: AC

## 2023-01-24 NOTE — Telephone Encounter (Signed)
Scheduled per 06/12 los, called and left a voicemail.

## 2023-01-24 NOTE — Patient Instructions (Signed)
[place] [facility]  Discharge Instructions: Thank you for choosing [facility] to provide your oncology and hematology care.   If you have a lab appointment with the Cancer Center, please go directly to the Cancer Center and check in at the registration area.   Wear comfortable clothing and clothing appropriate for easy access to any Portacath or PICC line.   We strive to give you quality time with your provider. You may need to reschedule your appointment if you arrive late (15 or more minutes).  Arriving late affects you and other patients whose appointments are after yours.  Also, if you miss three or more appointments without notifying the office, you may be dismissed from the clinic at the provider's discretion.      For prescription refill requests, have your pharmacy contact our office and allow 72 hours for refills to be completed.    Today you received the following chemotherapy and/or immunotherapy agents oxaliplatin, leucovorin, 5FU.      To help prevent nausea and vomiting after your treatment, we encourage you to take your nausea medication as directed.  BELOW ARE SYMPTOMS THAT SHOULD BE REPORTED IMMEDIATELY: *FEVER GREATER THAN 100.4 F (38 C) OR HIGHER *CHILLS OR SWEATING *NAUSEA AND VOMITING THAT IS NOT CONTROLLED WITH YOUR NAUSEA MEDICATION *UNUSUAL SHORTNESS OF BREATH *UNUSUAL BRUISING OR BLEEDING *URINARY PROBLEMS (pain or burning when urinating, or frequent urination) *BOWEL PROBLEMS (unusual diarrhea, constipation, pain near the anus) TENDERNESS IN MOUTH AND THROAT WITH OR WITHOUT PRESENCE OF ULCERS (sore throat, sores in mouth, or a toothache) UNUSUAL RASH, SWELLING OR PAIN  UNUSUAL VAGINAL DISCHARGE OR ITCHING   Items with * indicate a potential emergency and should be followed up as soon as possible or go to the Emergency Department if any problems should occur.  Please show the CHEMOTHERAPY ALERT CARD or IMMUNOTHERAPY  ALERT CARD at check-in to the Emergency Department and triage nurse.  Should you have questions after your visit or need to cancel or reschedule your appointment, please contact [facility]  Dept: [phone]  and follow the prompts.  Office hours are 8:00 a.m. to 4:30 p.m. Monday - Friday. Please note that voicemails left after 4:00 p.m. may not be returned until the following business day.  We are closed weekends and major holidays. You have access to a nurse at all times for urgent questions. Please call the main number to the clinic Dept: [phone] and follow the prompts.   For any non-urgent questions, you may also contact your provider using MyChart. We now offer e-Visits for anyone 70 and older to request care online for non-urgent symptoms. For details visit [email or web address].   Also download the MyChart app! Go to the app store, search "MyChart", open the app, select [place], and log in with your MyChart username and password.   Oxaliplatin Injection What is this medication? OXALIPLATIN (ox AL i PLA tin) treats colorectal cancer. It works by slowing down the growth of cancer cells. This medicine may be used for other purposes; ask your health care provider or pharmacist if you have questions. COMMON BRAND NAME(S): Eloxatin What should I tell my care team before I take this medication? They need to know if you have any of these conditions: Heart disease History of irregular heartbeat or rhythm Liver disease Low blood cell levels (white cells, red cells, and platelets) Lung or breathing disease, such as asthma Take medications that treat or prevent blood  clots Tingling of the fingers, toes, or other nerve disorder An unusual or allergic reaction to oxaliplatin, other medications, foods, dyes, or preservatives If you or your partner are pregnant or trying to get pregnant Breast-feeding How should I use this medication? This medication  is injected into a vein. It is given by your care team in a hospital or clinic setting. Talk to your care team about the use of this medication in children. Special care may be needed. Overdosage: If you think you have taken too much of this medicine contact a poison control center or emergency room at once. NOTE: This medicine is only for you. Do not share this medicine with others. What if I miss a dose? Keep appointments for follow-up doses. It is important not to miss a dose. Call your care team if you are unable to keep an appointment. What may interact with this medication? Do not take this medication with any of the following: Cisapride Dronedarone Pimozide Thioridazine This medication may also interact with the following: Aspirin and aspirin-like medications Certain medications that treat or prevent blood clots, such as warfarin, apixaban, dabigatran, and rivaroxaban Cisplatin Cyclosporine Diuretics Medications for infection, such as acyclovir, adefovir, amphotericin B, bacitracin, cidofovir, foscarnet, ganciclovir, gentamicin, pentamidine, vancomycin NSAIDs, medications for pain and inflammation, such as ibuprofen or naproxen Other medications that cause heart rhythm changes Pamidronate Zoledronic acid This list may not describe all possible interactions. Give your health care provider a list of all the medicines, herbs, non-prescription drugs, or dietary supplements you use. Also tell them if you smoke, drink alcohol, or use illegal drugs. Some items may interact with your medicine. What should I watch for while using this medication? Your condition will be monitored carefully while you are receiving this medication. You may need blood work while taking this medication. This medication may make you feel generally unwell. This is not uncommon as chemotherapy can affect healthy cells as well as cancer cells. Report any side effects. Continue your course of treatment even though you  feel ill unless your care team tells you to stop. This medication may increase your risk of getting an infection. Call your care team for advice if you get a fever, chills, sore throat, or other symptoms of a cold or flu. Do not treat yourself. Try to avoid being around people who are sick. Avoid taking medications that contain aspirin, acetaminophen, ibuprofen, naproxen, or ketoprofen unless instructed by your care team. These medications may hide a fever. Be careful brushing or flossing your teeth or using a toothpick because you may get an infection or bleed more easily. If you have any dental work done, tell your dentist you are receiving this medication. This medication can make you more sensitive to cold. Do not drink cold drinks or use ice. Cover exposed skin before coming in contact with cold temperatures or cold objects. When out in cold weather wear warm clothing and cover your mouth and nose to warm the air that goes into your lungs. Tell your care team if you get sensitive to the cold. Talk to your care team if you or your partner are pregnant or think either of you might be pregnant. This medication can cause serious birth defects if taken during pregnancy and for 9 months after the last dose. A negative pregnancy test is required before starting this medication. A reliable form of contraception is recommended while taking this medication and for 9 months after the last dose. Talk to your care team about effective  forms of contraception. Do not father a child while taking this medication and for 6 months after the last dose. Use a condom while having sex during this time period. Do not breastfeed while taking this medication and for 3 months after the last dose. This medication may cause infertility. Talk to your care team if you are concerned about your fertility. What side effects may I notice from receiving this medication? Side effects that you should report to your care team as soon as  possible: Allergic reactions--skin rash, itching, hives, swelling of the face, lips, tongue, or throat Bleeding--bloody or black, tar-like stools, vomiting blood or brown material that looks like coffee grounds, red or dark brown urine, small red or purple spots on skin, unusual bruising or bleeding Dry cough, shortness of breath or trouble breathing Heart rhythm changes--fast or irregular heartbeat, dizziness, feeling faint or lightheaded, chest pain, trouble breathing Infection--fever, chills, cough, sore throat, wounds that don't heal, pain or trouble when passing urine, general feeling of discomfort or being unwell Liver injury--right upper belly pain, loss of appetite, nausea, light-colored stool, dark yellow or brown urine, yellowing skin or eyes, unusual weakness or fatigue Low red blood cell level--unusual weakness or fatigue, dizziness, headache, trouble breathing Muscle injury--unusual weakness or fatigue, muscle pain, dark yellow or brown urine, decrease in amount of urine Pain, tingling, or numbness in the hands or feet Sudden and severe headache, confusion, change in vision, seizures, which may be signs of posterior reversible encephalopathy syndrome (PRES) Unusual bruising or bleeding Side effects that usually do not require medical attention (report to your care team if they continue or are bothersome): Diarrhea Nausea Pain, redness, or swelling with sores inside the mouth or throat Unusual weakness or fatigue Vomiting This list may not describe all possible side effects. Call your doctor for medical advice about side effects. You may report side effects to FDA at 1-800-FDA-1088. Where should I keep my medication? This medication is given in a hospital or clinic. It will not be stored at home. NOTE: This sheet is a summary. It may not cover all possible information. If you have questions about this medicine, talk to your doctor, pharmacist, or health care provider.  2024  Elsevier/Gold Standard (2022-10-08 00:00:00)  Leucovorin Injection What is this medication? LEUCOVORIN (loo koe VOR in) prevents side effects from certain medications, such as methotrexate. It works by increasing folate levels. This helps protect healthy cells in your body. It may also be used to treat anemia caused by low levels of folate. It can also be used with fluorouracil, a type of chemotherapy, to treat colorectal cancer. It works by increasing the effects of fluorouracil in the body. This medicine may be used for other purposes; ask your health care provider or pharmacist if you have questions. What should I tell my care team before I take this medication? They need to know if you have any of these conditions: Anemia from low levels of vitamin B12 in the blood An unusual or allergic reaction to leucovorin, folic acid, other medications, foods, dyes, or preservatives Pregnant or trying to get pregnant Breastfeeding How should I use this medication? This medication is injected into a vein or a muscle. It is given by your care team in a hospital or clinic setting. Talk to your care team about the use of this medication in children. Special care may be needed. Overdosage: If you think you have taken too much of this medicine contact a poison control center or emergency room  at once. NOTE: This medicine is only for you. Do not share this medicine with others. What if I miss a dose? Keep appointments for follow-up doses. It is important not to miss your dose. Call your care team if you are unable to keep an appointment. What may interact with this medication? Capecitabine Fluorouracil Phenobarbital Phenytoin Primidone Trimethoprim;sulfamethoxazole This list may not describe all possible interactions. Give your health care provider a list of all the medicines, herbs, non-prescription drugs, or dietary supplements you use. Also tell them if you smoke, drink alcohol, or use illegal drugs.  Some items may interact with your medicine. What should I watch for while using this medication? Your condition will be monitored carefully while you are receiving this medication. This medication may increase the side effects of 5-fluorouracil. Tell your care team if you have diarrhea or mouth sores that do not get better or that get worse. What side effects may I notice from receiving this medication? Side effects that you should report to your care team as soon as possible: Allergic reactions--skin rash, itching, hives, swelling of the face, lips, tongue, or throat This list may not describe all possible side effects. Call your doctor for medical advice about side effects. You may report side effects to FDA at 1-800-FDA-1088. Where should I keep my medication? This medication is given in a hospital or clinic. It will not be stored at home. NOTE: This sheet is a summary. It may not cover all possible information. If you have questions about this medicine, talk to your doctor, pharmacist, or health care provider.  2024 Elsevier/Gold Standard (2022-01-03 00:00:00)  Fluorouracil Injection What is this medication? FLUOROURACIL (flure oh YOOR a sil) treats some types of cancer. It works by slowing down the growth of cancer cells. This medicine may be used for other purposes; ask your health care provider or pharmacist if you have questions. COMMON BRAND NAME(S): Adrucil What should I tell my care team before I take this medication? They need to know if you have any of these conditions: Blood disorders Dihydropyrimidine dehydrogenase (DPD) deficiency Infection, such as chickenpox, cold sores, herpes Kidney disease Liver disease Poor nutrition Recent or ongoing radiation therapy An unusual or allergic reaction to fluorouracil, other medications, foods, dyes, or preservatives If you or your partner are pregnant or trying to get pregnant Breast-feeding How should I use this medication? This  medication is injected into a vein. It is administered by your care team in a hospital or clinic setting. Talk to your care team about the use of this medication in children. Special care may be needed. Overdosage: If you think you have taken too much of this medicine contact a poison control center or emergency room at once. NOTE: This medicine is only for you. Do not share this medicine with others. What if I miss a dose? Keep appointments for follow-up doses. It is important not to miss your dose. Call your care team if you are unable to keep an appointment. What may interact with this medication? Do not take this medication with any of the following: Live virus vaccines This medication may also interact with the following: Medications that treat or prevent blood clots, such as warfarin, enoxaparin, dalteparin This list may not describe all possible interactions. Give your health care provider a list of all the medicines, herbs, non-prescription drugs, or dietary supplements you use. Also tell them if you smoke, drink alcohol, or use illegal drugs. Some items may interact with your medicine. What should  I watch for while using this medication? Your condition will be monitored carefully while you are receiving this medication. This medication may make you feel generally unwell. This is not uncommon as chemotherapy can affect healthy cells as well as cancer cells. Report any side effects. Continue your course of treatment even though you feel ill unless your care team tells you to stop. In some cases, you may be given additional medications to help with side effects. Follow all directions for their use. This medication may increase your risk of getting an infection. Call your care team for advice if you get a fever, chills, sore throat, or other symptoms of a cold or flu. Do not treat yourself. Try to avoid being around people who are sick. This medication may increase your risk to bruise or bleed.  Call your care team if you notice any unusual bleeding. Be careful brushing or flossing your teeth or using a toothpick because you may get an infection or bleed more easily. If you have any dental work done, tell your dentist you are receiving this medication. Avoid taking medications that contain aspirin, acetaminophen, ibuprofen, naproxen, or ketoprofen unless instructed by your care team. These medications may hide a fever. Do not treat diarrhea with over the counter products. Contact your care team if you have diarrhea that lasts more than 2 days or if it is severe and watery. This medication can make you more sensitive to the sun. Keep out of the sun. If you cannot avoid being in the sun, wear protective clothing and sunscreen. Do not use sun lamps, tanning beds, or tanning booths. Talk to your care team if you or your partner wish to become pregnant or think you might be pregnant. This medication can cause serious birth defects if taken during pregnancy and for 3 months after the last dose. A reliable form of contraception is recommended while taking this medication and for 3 months after the last dose. Talk to your care team about effective forms of contraception. Do not father a child while taking this medication and for 3 months after the last dose. Use a condom while having sex during this time period. Do not breastfeed while taking this medication. This medication may cause infertility. Talk to your care team if you are concerned about your fertility. What side effects may I notice from receiving this medication? Side effects that you should report to your care team as soon as possible: Allergic reactions--skin rash, itching, hives, swelling of the face, lips, tongue, or throat Heart attack--pain or tightness in the chest, shoulders, arms, or jaw, nausea, shortness of breath, cold or clammy skin, feeling faint or lightheaded Heart failure--shortness of breath, swelling of the ankles, feet, or  hands, sudden weight gain, unusual weakness or fatigue Heart rhythm changes--fast or irregular heartbeat, dizziness, feeling faint or lightheaded, chest pain, trouble breathing High ammonia level--unusual weakness or fatigue, confusion, loss of appetite, nausea, vomiting, seizures Infection--fever, chills, cough, sore throat, wounds that don't heal, pain or trouble when passing urine, general feeling of discomfort or being unwell Low red blood cell level--unusual weakness or fatigue, dizziness, headache, trouble breathing Pain, tingling, or numbness in the hands or feet, muscle weakness, change in vision, confusion or trouble speaking, loss of balance or coordination, trouble walking, seizures Redness, swelling, and blistering of the skin over hands and feet Severe or prolonged diarrhea Unusual bruising or bleeding Side effects that usually do not require medical attention (report to your care team if they continue or  are bothersome): Dry skin Headache Increased tears Nausea Pain, redness, or swelling with sores inside the mouth or throat Sensitivity to light Vomiting This list may not describe all possible side effects. Call your doctor for medical advice about side effects. You may report side effects to FDA at 1-800-FDA-1088. Where should I keep my medication? This medication is given in a hospital or clinic. It will not be stored at home. NOTE: This sheet is a summary. It may not cover all possible information. If you have questions about this medicine, talk to your doctor, pharmacist, or health care provider.  2024 Elsevier/Gold Standard (2021-12-06 00:00:00)   The chemotherapy medication bag should finish at 46 hours, 96 hours, or 7 days. For example, if your pump is scheduled for 46 hours and it was put on at 4:00 p.m., it should finish at 2:00 p.m. the day it is scheduled to come off regardless of your appointment time.     Estimated time to finish at 12:30pm on Friday 14, 2024..    If the display on your pump reads "Low Volume" and it is beeping, take the batteries out of the pump and come to the cancer center for it to be taken off.   If the pump alarms go off prior to the pump reading "Low Volume" then call [phone] and someone can assist you.  If the plunger comes out and the chemotherapy medication is leaking out, please use your home chemo spill kit to clean up the spill. Do NOT use paper towels or other household products.  If you have problems or questions regarding your pump, please call either [phone] (24 hours a day) or the cancer center Monday-Friday 8:00 a.m.- 4:30 p.m. at the clinic number and we will assist you. If you are unable to get assistance, then go to the nearest Emergency Department and ask the staff to contact the IV team for assistance.

## 2023-01-24 NOTE — Progress Notes (Signed)
Patient seen by Dr. Truett Perna today  Vitals are within treatment parameters.  Labs reviewed by Dr. Truett Perna and are within treatment parameters.  Per physician team, patient is ready for treatment. Please note that modifications are being made to the treatment plan including Adding Emend to premeds.

## 2023-01-24 NOTE — Progress Notes (Signed)
[facility] OFFICE PROGRESS NOTE   Diagnosis: Colon cancer  INTERVAL HISTORY:   Ms. Keltner pleated on cycle of FOLFOX on 01/03/2023.  She developed nausea beginning on day 3.  The nausea lasted for 9 days.  She had several episodes of emesis on a few of the days.  The last episode of emesis was on 01/12/2023.  Zofran and Compazine did not help the nausea.  She had no relief with Decadron.  Nausea resolved after 01/12/2023.  She had diarrhea during the week following chemotherapy.  She reports no one else in the family was sick.  She went on vacation last week.  She feels well at present.  No neuropathy symptoms.  Objective:  Vital signs in last 24 hours:  Blood pressure 108/63, pulse 61, temperature 98.1 F (36.7 C), temperature source Oral, resp. rate 18, height 5\' 4"  (1.626 m), weight 137 lb 1.6 oz (62.2 kg), last menstrual period 03/28/2013, SpO2 100 %.    HEENT: No thrush or ulcers Resp: Lungs clear bilaterally Cardio: Regular rate and rhythm GI: Nontender, no hepatosplenomegaly Vascular: No leg edema Neuro: The vibratory sense is intact at the fingertips bilaterally Skin: Palms without erythema  Portacath/PICC-without erythema  Lab Results:  Lab Results  Component Value Date   WBC 4.1 01/24/2023   HGB 10.4 (L) 01/24/2023   HCT 32.4 (L) 01/24/2023   MCV 96.4 01/24/2023   PLT 177 01/24/2023   NEUTROABS 2.5 01/24/2023    CMP  Lab Results  Component Value Date   NA 140 01/03/2023   K 3.7 01/03/2023   CL 106 01/03/2023   CO2 27 01/03/2023   GLUCOSE 98 01/03/2023   BUN 9 01/03/2023   CREATININE 0.69 01/03/2023   CALCIUM 9.0 01/03/2023   PROT 6.5 01/03/2023   ALBUMIN 4.2 01/03/2023   AST 24 01/03/2023   ALT 28 01/03/2023   ALKPHOS 86 01/03/2023   BILITOT 0.4 01/03/2023   GFRNONAA >60 01/03/2023    Lab Results  Component Value Date   CEA <1.00 01/03/2023   e reviewed the patient's current medications.   Assessment/Plan: Colon cancer,  stage IIa (T3 N0 M0), sigmoidectomy 04/29/2021 Colonoscopy 03/24/2021-partially obstructing mass in the sigmoid colon, biopsy-adenocarcinoma CTs 03/28/2021-sigmoid colon mass, prominent presacral and sigmoid mesentery lymph nodes-nonspecific, no evidence of distant metastatic disease, sclerotic lesions at the left intertrochanteric region-likely a bone island Sigmoidectomy 04/29/2021-sigmoid colon tumor, 0/16 lymph nodes, no lymphovascular or perineural invasion, no loss of mismatch repair protein expression, MSS Foundation 1-MSS, tumor mutation burden 6, K-ras and NRAS wild-type, ERBB2 amplification-equivocal HER2 2+, negative by FISH Colonoscopy 04/27/2022-polyps removed from the ascending and transverse colon-tubular adenomas 05/31/2022 CEA 8.54 06/28/2022 CEA 9.48 07/10/2022 CTs-new lesion central left hepatic lobe 07/16/2022 MRI 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 Seen by Dr. 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 Modesta Messing, tumor dissected off of the vessel treated with intraoperative argon Cycle 7 FOLFOX 12/20/2022 Cycle 8 FOLFOX 01/03/2023 Cycle 9 FOLFOX 01/24/2023-Emend added     Disposition: Ms. Mactaggart appears well.  She had severe nausea and vomiting following cycle 8 FOLFOX.  She had not experienced significant nausea with previous cycles.  The nausea was most  likely related to chemotherapy, but this is unusual.  She will complete another cycle of FOLFOX today.  Emend prophylaxis will be added to the antiemetic regimen.  She will use lorazepam as needed.  She will contact us if  she develops significant nausea following this cycle of chemotherapy.  Ms. Pressey will return for an office visit and chemotherapy in 2 weeks.  She has not developed oxaliplatin neuropathy.  Thornton Papas, MD  01/24/2023  9:27 AM

## 2023-01-26 ENCOUNTER — Inpatient Hospital Stay: Payer: Medicare Other

## 2023-01-26 VITALS — BP 112/68 | HR 67 | Temp 98.1°F | Resp 18

## 2023-01-26 DIAGNOSIS — Z5189 Encounter for other specified aftercare: Secondary | ICD-10-CM | POA: Diagnosis not present

## 2023-01-26 DIAGNOSIS — Z5111 Encounter for antineoplastic chemotherapy: Secondary | ICD-10-CM | POA: Diagnosis not present

## 2023-01-26 DIAGNOSIS — Z85038 Personal history of other malignant neoplasm of large intestine: Secondary | ICD-10-CM

## 2023-01-26 DIAGNOSIS — C187 Malignant neoplasm of sigmoid colon: Secondary | ICD-10-CM | POA: Diagnosis not present

## 2023-01-26 MED ORDER — HEPARIN SOD (PORK) LOCK FLUSH 100 UNIT/ML IV SOLN
500.0000 [IU] | Freq: Once | INTRAVENOUS | Status: AC | PRN
  Administered 2023-01-26: 500 [IU]

## 2023-01-26 MED ORDER — PEGFILGRASTIM-CBQV 6 MG/0.6ML ~~LOC~~ SOSY
6.0000 mg | PREFILLED_SYRINGE | Freq: Once | SUBCUTANEOUS | Status: AC
  Administered 2023-01-26: 6 mg via SUBCUTANEOUS
  Filled 2023-01-26: qty 0.6

## 2023-01-26 MED ORDER — SODIUM CHLORIDE 0.9% FLUSH
10.0000 mL | INTRAVENOUS | Status: DC | PRN
  Administered 2023-01-26: 10 mL

## 2023-02-04 ENCOUNTER — Other Ambulatory Visit: Payer: Self-pay | Admitting: Oncology

## 2023-02-04 DIAGNOSIS — Z85038 Personal history of other malignant neoplasm of large intestine: Secondary | ICD-10-CM

## 2023-02-07 ENCOUNTER — Inpatient Hospital Stay: Payer: Medicare Other

## 2023-02-07 ENCOUNTER — Inpatient Hospital Stay: Payer: Medicare Other | Admitting: Nurse Practitioner

## 2023-02-08 ENCOUNTER — Inpatient Hospital Stay: Payer: Medicare Other

## 2023-02-08 ENCOUNTER — Encounter: Payer: Self-pay | Admitting: Oncology

## 2023-02-08 ENCOUNTER — Encounter: Payer: Self-pay | Admitting: Nurse Practitioner

## 2023-02-08 ENCOUNTER — Inpatient Hospital Stay: Payer: Medicare Other | Admitting: Nurse Practitioner

## 2023-02-08 VITALS — BP 111/62 | HR 70

## 2023-02-08 VITALS — BP 115/65 | HR 86 | Temp 98.2°F | Resp 18 | Ht 64.0 in | Wt 138.5 lb

## 2023-02-08 DIAGNOSIS — C187 Malignant neoplasm of sigmoid colon: Secondary | ICD-10-CM | POA: Diagnosis not present

## 2023-02-08 DIAGNOSIS — Z85038 Personal history of other malignant neoplasm of large intestine: Secondary | ICD-10-CM

## 2023-02-08 DIAGNOSIS — Z5111 Encounter for antineoplastic chemotherapy: Secondary | ICD-10-CM | POA: Diagnosis not present

## 2023-02-08 DIAGNOSIS — K769 Liver disease, unspecified: Secondary | ICD-10-CM

## 2023-02-08 DIAGNOSIS — Z5189 Encounter for other specified aftercare: Secondary | ICD-10-CM | POA: Diagnosis not present

## 2023-02-08 LAB — CBC WITH DIFFERENTIAL (CANCER CENTER ONLY)
Abs Immature Granulocytes: 0.09 10*3/uL — ABNORMAL HIGH (ref 0.00–0.07)
Basophils Absolute: 0 10*3/uL (ref 0.0–0.1)
Basophils Relative: 1 %
Eosinophils Absolute: 0.1 10*3/uL (ref 0.0–0.5)
Eosinophils Relative: 1 %
HCT: 34.3 % — ABNORMAL LOW (ref 36.0–46.0)
Hemoglobin: 11.1 g/dL — ABNORMAL LOW (ref 12.0–15.0)
Immature Granulocytes: 2 %
Lymphocytes Relative: 21 %
Lymphs Abs: 1.2 10*3/uL (ref 0.7–4.0)
MCH: 30.7 pg (ref 26.0–34.0)
MCHC: 32.4 g/dL (ref 30.0–36.0)
MCV: 95 fL (ref 80.0–100.0)
Monocytes Absolute: 0.7 10*3/uL (ref 0.1–1.0)
Monocytes Relative: 12 %
Neutro Abs: 3.7 10*3/uL (ref 1.7–7.7)
Neutrophils Relative %: 63 %
Platelet Count: 137 10*3/uL — ABNORMAL LOW (ref 150–400)
RBC: 3.61 MIL/uL — ABNORMAL LOW (ref 3.87–5.11)
RDW: 15 % (ref 11.5–15.5)
WBC Count: 5.8 10*3/uL (ref 4.0–10.5)
nRBC: 0 % (ref 0.0–0.2)

## 2023-02-08 LAB — CMP (CANCER CENTER ONLY)
ALT: 26 U/L (ref 0–44)
AST: 28 U/L (ref 15–41)
Albumin: 3.9 g/dL (ref 3.5–5.0)
Alkaline Phosphatase: 116 U/L (ref 38–126)
Anion gap: 7 (ref 5–15)
BUN: 11 mg/dL (ref 8–23)
CO2: 27 mmol/L (ref 22–32)
Calcium: 9 mg/dL (ref 8.9–10.3)
Chloride: 105 mmol/L (ref 98–111)
Creatinine: 0.66 mg/dL (ref 0.44–1.00)
GFR, Estimated: >60 mL/min (ref >60)
Glucose, Bld: 91 mg/dL (ref 70–99)
Potassium: 4.4 mmol/L (ref 3.5–5.1)
Sodium: 139 mmol/L (ref 135–145)
Total Bilirubin: 0.4 mg/dL (ref 0.3–1.2)
Total Protein: 6.6 g/dL (ref 6.5–8.1)

## 2023-02-08 LAB — CEA (ACCESS): CEA (CHCC): 1.29 ng/mL (ref 0.00–5.00)

## 2023-02-08 MED ORDER — SODIUM CHLORIDE 0.9 % IV SOLN
150.0000 mg | Freq: Once | INTRAVENOUS | Status: AC
  Administered 2023-02-08: 150 mg via INTRAVENOUS
  Filled 2023-02-08: qty 150

## 2023-02-08 MED ORDER — SODIUM CHLORIDE 0.9% FLUSH
10.0000 mL | INTRAVENOUS | Status: DC | PRN
  Administered 2023-02-08: 10 mL

## 2023-02-08 MED ORDER — FLUOROURACIL CHEMO INJECTION 2.5 GM/50ML
400.0000 mg/m2 | Freq: Once | INTRAVENOUS | Status: AC
  Administered 2023-02-08: 700 mg via INTRAVENOUS
  Filled 2023-02-08: qty 14

## 2023-02-08 MED ORDER — SODIUM CHLORIDE 0.9 % IV SOLN
2400.0000 mg/m2 | INTRAVENOUS | Status: DC
  Administered 2023-02-08: 4050 mg via INTRAVENOUS
  Filled 2023-02-08: qty 81

## 2023-02-08 MED ORDER — PALONOSETRON HCL INJECTION 0.25 MG/5ML
0.2500 mg | Freq: Once | INTRAVENOUS | Status: AC
  Administered 2023-02-08: 0.25 mg via INTRAVENOUS
  Filled 2023-02-08: qty 5

## 2023-02-08 MED ORDER — DEXTROSE 5 % IV SOLN: Freq: Once | INTRAVENOUS | Status: AC

## 2023-02-08 MED ORDER — LEUCOVORIN CALCIUM INJECTION 350 MG
400.0000 mg/m2 | Freq: Once | INTRAVENOUS | Status: AC
  Administered 2023-02-08: 676 mg via INTRAVENOUS
  Filled 2023-02-08: qty 33.8

## 2023-02-08 MED ORDER — SODIUM CHLORIDE 0.9 % IV SOLN
10.0000 mg | Freq: Once | INTRAVENOUS | Status: AC
  Administered 2023-02-08: 10 mg via INTRAVENOUS
  Filled 2023-02-08: qty 10

## 2023-02-08 MED ORDER — OXALIPLATIN CHEMO INJECTION 100 MG/20ML
65.0000 mg/m2 | Freq: Once | INTRAVENOUS | Status: AC
  Administered 2023-02-08: 100 mg via INTRAVENOUS
  Filled 2023-02-08: qty 20

## 2023-02-08 NOTE — Patient Instructions (Addendum)
[facility]   The chemotherapy medication bag should finish at 46 hours, 96 hours, or 7 days. For example, if your pump is scheduled for 46 hours and it was put on at 4:00 p.m., it should finish at 2:00 p.m. the day it is scheduled to come off regardless of your appointment time.     Estimated time to finish at 11:45 Saturday, February 10, 2023.   If the display on your pump reads "Low Volume" and it is beeping, take the batteries out of the pump and come to the cancer center for it to be taken off.   If the pump alarms go off prior to the pump reading "Low Volume" then call [phone] and someone can assist you.  If the plunger comes out and the chemotherapy medication is leaking out, please use your home chemo spill kit to clean up the spill. Do NOT use paper towels or other household products.  If you have problems or questions regarding your pump, please call either [phone] (24 hours a day) or the cancer center Monday-Friday 8:00 a.m.- 4:30 p.m. at the clinic number and we will assist you. If you are unable to get assistance, then go to the nearest Emergency Department and ask the staff to contact the IV team for assistance.  Discharge Instructions: Thank you for choosing [facility] to provide your oncology and hematology care.   If you have a lab appointment with the Cancer Center, please go directly to the Cancer Center and check in at the registration area.   Wear comfortable clothing and clothing appropriate for easy access to any Portacath or PICC line.   We strive to give you quality time with your provider. You may need to reschedule your appointment if you arrive late (15 or more minutes).  Arriving late affects you and other patients whose appointments are after yours.  Also, if you miss three or more appointments without notifying the office, you may be dismissed from the clinic at the provider's discretion.      For  prescription refill requests, have your pharmacy contact our office and allow 72 hours for refills to be completed.    Today you received the following chemotherapy and/or immunotherapy agents Oxaliplatin, Leucovorin, Fluorouracil.      To help prevent nausea and vomiting after your treatment, we encourage you to take your nausea medication as directed.  BELOW ARE SYMPTOMS THAT SHOULD BE REPORTED IMMEDIATELY: *FEVER GREATER THAN 100.4 F (38 C) OR HIGHER *CHILLS OR SWEATING *NAUSEA AND VOMITING THAT IS NOT CONTROLLED WITH YOUR NAUSEA MEDICATION *UNUSUAL SHORTNESS OF BREATH *UNUSUAL BRUISING OR BLEEDING *URINARY PROBLEMS (pain or burning when urinating, or frequent urination) *BOWEL PROBLEMS (unusual diarrhea, constipation, pain near the anus) TENDERNESS IN MOUTH AND THROAT WITH OR WITHOUT PRESENCE OF ULCERS (sore throat, sores in mouth, or a toothache) UNUSUAL RASH, SWELLING OR PAIN  UNUSUAL VAGINAL DISCHARGE OR ITCHING   Items with * indicate a potential emergency and should be followed up as soon as possible or go to the Emergency Department if any problems should occur.  Please show the CHEMOTHERAPY ALERT CARD or IMMUNOTHERAPY ALERT CARD at check-in to the Emergency Department and triage nurse.  Should you have questions after your visit or need to cancel or reschedule your appointment, please contact [facility]  Dept: [phone]  and follow the prompts.  Office hours are 8:00 a.m. to 4:30 p.m. Monday - Friday. Please note  that voicemails left after 4:00 p.m. may not be returned until the following business day.  We are closed weekends and major holidays. You have access to a nurse at all times for urgent questions. Please call the main number to the clinic Dept: [phone] and follow the prompts.   For any non-urgent questions, you may also contact your provider using MyChart. We now offer e-Visits for anyone 1 and older to request care online  for non-urgent symptoms. For details visit [email or web address].   Also download the MyChart app! Go to the app store, search "MyChart", open the app, select [place], and log in with your MyChart username and password.  Oxaliplatin Injection What is this medication? OXALIPLATIN (ox AL i PLA tin) treats colorectal cancer. It works by slowing down the growth of cancer cells. This medicine may be used for other purposes; ask your health care provider or pharmacist if you have questions. COMMON BRAND NAME(S): Eloxatin What should I tell my care team before I take this medication? They need to know if you have any of these conditions: Heart disease History of irregular heartbeat or rhythm Liver disease Low blood cell levels (white cells, red cells, and platelets) Lung or breathing disease, such as asthma Take medications that treat or prevent blood clots Tingling of the fingers, toes, or other nerve disorder An unusual or allergic reaction to oxaliplatin, other medications, foods, dyes, or preservatives If you or your partner are pregnant or trying to get pregnant Breast-feeding How should I use this medication? This medication is injected into a vein. It is given by your care team in a hospital or clinic setting. Talk to your care team about the use of this medication in children. Special care may be needed. Overdosage: If you think you have taken too much of this medicine contact a poison control center or emergency room at once. NOTE: This medicine is only for you. Do not share this medicine with others. What if I miss a dose? Keep appointments for follow-up doses. It is important not to miss a dose. Call your care team if you are unable to keep an appointment. What may interact with this medication? Do not take this medication with any of the following: Cisapride Dronedarone Pimozide Thioridazine This medication may also interact with the following: Aspirin and aspirin-like  medications Certain medications that treat or prevent blood clots, such as warfarin, apixaban, dabigatran, and rivaroxaban Cisplatin Cyclosporine Diuretics Medications for infection, such as acyclovir, adefovir, amphotericin B, bacitracin, cidofovir, foscarnet, ganciclovir, gentamicin, pentamidine, vancomycin NSAIDs, medications for pain and inflammation, such as ibuprofen or naproxen Other medications that cause heart rhythm changes Pamidronate Zoledronic acid This list may not describe all possible interactions. Give your health care provider a list of all the medicines, herbs, non-prescription drugs, or dietary supplements you use. Also tell them if you smoke, drink alcohol, or use illegal drugs. Some items may interact with your medicine. What should I watch for while using this medication? Your condition will be monitored carefully while you are receiving this medication. You may need blood work while taking this medication. This medication may make you feel generally unwell. This is not uncommon as chemotherapy can affect healthy cells as well as cancer cells. Report any side effects. Continue your course of treatment even though you feel ill unless your care team tells you to stop. This medication may increase your risk of getting an infection. Call your care team for advice if you get a fever, chills, sore throat, or  other symptoms of a cold or flu. Do not treat yourself. Try to avoid being around people who are sick. Avoid taking medications that contain aspirin, acetaminophen, ibuprofen, naproxen, or ketoprofen unless instructed by your care team. These medications may hide a fever. Be careful brushing or flossing your teeth or using a toothpick because you may get an infection or bleed more easily. If you have any dental work done, tell your dentist you are receiving this medication. This medication can make you more sensitive to cold. Do not drink cold drinks or use ice. Cover exposed  skin before coming in contact with cold temperatures or cold objects. When out in cold weather wear warm clothing and cover your mouth and nose to warm the air that goes into your lungs. Tell your care team if you get sensitive to the cold. Talk to your care team if you or your partner are pregnant or think either of you might be pregnant. This medication can cause serious birth defects if taken during pregnancy and for 9 months after the last dose. A negative pregnancy test is required before starting this medication. A reliable form of contraception is recommended while taking this medication and for 9 months after the last dose. Talk to your care team about effective forms of contraception. Do not father a child while taking this medication and for 6 months after the last dose. Use a condom while having sex during this time period. Do not breastfeed while taking this medication and for 3 months after the last dose. This medication may cause infertility. Talk to your care team if you are concerned about your fertility. What side effects may I notice from receiving this medication? Side effects that you should report to your care team as soon as possible: Allergic reactions--skin rash, itching, hives, swelling of the face, lips, tongue, or throat Bleeding--bloody or black, tar-like stools, vomiting blood or brown material that looks like coffee grounds, red or dark brown urine, small red or purple spots on skin, unusual bruising or bleeding Dry cough, shortness of breath or trouble breathing Heart rhythm changes--fast or irregular heartbeat, dizziness, feeling faint or lightheaded, chest pain, trouble breathing Infection--fever, chills, cough, sore throat, wounds that don't heal, pain or trouble when passing urine, general feeling of discomfort or being unwell Liver injury--right upper belly pain, loss of appetite, nausea, light-colored stool, dark yellow or brown urine, yellowing skin or eyes, unusual  weakness or fatigue Low red blood cell level--unusual weakness or fatigue, dizziness, headache, trouble breathing Muscle injury--unusual weakness or fatigue, muscle pain, dark yellow or brown urine, decrease in amount of urine Pain, tingling, or numbness in the hands or feet Sudden and severe headache, confusion, change in vision, seizures, which may be signs of posterior reversible encephalopathy syndrome (PRES) Unusual bruising or bleeding Side effects that usually do not require medical attention (report to your care team if they continue or are bothersome): Diarrhea Nausea Pain, redness, or swelling with sores inside the mouth or throat Unusual weakness or fatigue Vomiting This list may not describe all possible side effects. Call your doctor for medical advice about side effects. You may report side effects to FDA at 1-800-FDA-1088. Where should I keep my medication? This medication is given in a hospital or clinic. It will not be stored at home. NOTE: This sheet is a summary. It may not cover all possible information. If you have questions about this medicine, talk to your doctor, pharmacist, or health care provider.  2024 Elsevier/Gold Standard (  2022-10-08 00:00:00) Leucovorin Injection What is this medication? LEUCOVORIN (loo koe VOR in) prevents side effects from certain medications, such as methotrexate. It works by increasing folate levels. This helps protect healthy cells in your body. It may also be used to treat anemia caused by low levels of folate. It can also be used with fluorouracil, a type of chemotherapy, to treat colorectal cancer. It works by increasing the effects of fluorouracil in the body. This medicine may be used for other purposes; ask your health care provider or pharmacist if you have questions. What should I tell my care team before I take this medication? They need to know if you have any of these conditions: Anemia from low levels of vitamin B12 in the  blood An unusual or allergic reaction to leucovorin, folic acid, other medications, foods, dyes, or preservatives Pregnant or trying to get pregnant Breastfeeding How should I use this medication? This medication is injected into a vein or a muscle. It is given by your care team in a hospital or clinic setting. Talk to your care team about the use of this medication in children. Special care may be needed. Overdosage: If you think you have taken too much of this medicine contact a poison control center or emergency room at once. NOTE: This medicine is only for you. Do not share this medicine with others. What if I miss a dose? Keep appointments for follow-up doses. It is important not to miss your dose. Call your care team if you are unable to keep an appointment. What may interact with this medication? Capecitabine Fluorouracil Phenobarbital Phenytoin Primidone Trimethoprim;sulfamethoxazole This list may not describe all possible interactions. Give your health care provider a list of all the medicines, herbs, non-prescription drugs, or dietary supplements you use. Also tell them if you smoke, drink alcohol, or use illegal drugs. Some items may interact with your medicine. What should I watch for while using this medication? Your condition will be monitored carefully while you are receiving this medication. This medication may increase the side effects of 5-fluorouracil. Tell your care team if you have diarrhea or mouth sores that do not get better or that get worse. What side effects may I notice from receiving this medication? Side effects that you should report to your care team as soon as possible: Allergic reactions--skin rash, itching, hives, swelling of the face, lips, tongue, or throat This list may not describe all possible side effects. Call your doctor for medical advice about side effects. You may report side effects to FDA at 1-800-FDA-1088. Where should I keep my  medication? This medication is given in a hospital or clinic. It will not be stored at home. NOTE: This sheet is a summary. It may not cover all possible information. If you have questions about this medicine, talk to your doctor, pharmacist, or health care provider.  2024 Elsevier/Gold Standard (2022-01-03 00:00:00) Fluorouracil Injection What is this medication? FLUOROURACIL (flure oh YOOR a sil) treats some types of cancer. It works by slowing down the growth of cancer cells. This medicine may be used for other purposes; ask your health care provider or pharmacist if you have questions. COMMON BRAND NAME(S): Adrucil What should I tell my care team before I take this medication? They need to know if you have any of these conditions: Blood disorders Dihydropyrimidine dehydrogenase (DPD) deficiency Infection, such as chickenpox, cold sores, herpes Kidney disease Liver disease Poor nutrition Recent or ongoing radiation therapy An unusual or allergic reaction to fluorouracil, other  medications, foods, dyes, or preservatives If you or your partner are pregnant or trying to get pregnant Breast-feeding How should I use this medication? This medication is injected into a vein. It is administered by your care team in a hospital or clinic setting. Talk to your care team about the use of this medication in children. Special care may be needed. Overdosage: If you think you have taken too much of this medicine contact a poison control center or emergency room at once. NOTE: This medicine is only for you. Do not share this medicine with others. What if I miss a dose? Keep appointments for follow-up doses. It is important not to miss your dose. Call your care team if you are unable to keep an appointment. What may interact with this medication? Do not take this medication with any of the following: Live virus vaccines This medication may also interact with the following: Medications that treat or  prevent blood clots, such as warfarin, enoxaparin, dalteparin This list may not describe all possible interactions. Give your health care provider a list of all the medicines, herbs, non-prescription drugs, or dietary supplements you use. Also tell them if you smoke, drink alcohol, or use illegal drugs. Some items may interact with your medicine. What should I watch for while using this medication? Your condition will be monitored carefully while you are receiving this medication. This medication may make you feel generally unwell. This is not uncommon as chemotherapy can affect healthy cells as well as cancer cells. Report any side effects. Continue your course of treatment even though you feel ill unless your care team tells you to stop. In some cases, you may be given additional medications to help with side effects. Follow all directions for their use. This medication may increase your risk of getting an infection. Call your care team for advice if you get a fever, chills, sore throat, or other symptoms of a cold or flu. Do not treat yourself. Try to avoid being around people who are sick. This medication may increase your risk to bruise or bleed. Call your care team if you notice any unusual bleeding. Be careful brushing or flossing your teeth or using a toothpick because you may get an infection or bleed more easily. If you have any dental work done, tell your dentist you are receiving this medication. Avoid taking medications that contain aspirin, acetaminophen, ibuprofen, naproxen, or ketoprofen unless instructed by your care team. These medications may hide a fever. Do not treat diarrhea with over the counter products. Contact your care team if you have diarrhea that lasts more than 2 days or if it is severe and watery. This medication can make you more sensitive to the sun. Keep out of the sun. If you cannot avoid being in the sun, wear protective clothing and sunscreen. Do not use sun lamps,  tanning beds, or tanning booths. Talk to your care team if you or your partner wish to become pregnant or think you might be pregnant. This medication can cause serious birth defects if taken during pregnancy and for 3 months after the last dose. A reliable form of contraception is recommended while taking this medication and for 3 months after the last dose. Talk to your care team about effective forms of contraception. Do not father a child while taking this medication and for 3 months after the last dose. Use a condom while having sex during this time period. Do not breastfeed while taking this medication. This medication may cause  infertility. Talk to your care team if you are concerned about your fertility. What side effects may I notice from receiving this medication? Side effects that you should report to your care team as soon as possible: Allergic reactions--skin rash, itching, hives, swelling of the face, lips, tongue, or throat Heart attack--pain or tightness in the chest, shoulders, arms, or jaw, nausea, shortness of breath, cold or clammy skin, feeling faint or lightheaded Heart failure--shortness of breath, swelling of the ankles, feet, or hands, sudden weight gain, unusual weakness or fatigue Heart rhythm changes--fast or irregular heartbeat, dizziness, feeling faint or lightheaded, chest pain, trouble breathing High ammonia level--unusual weakness or fatigue, confusion, loss of appetite, nausea, vomiting, seizures Infection--fever, chills, cough, sore throat, wounds that don't heal, pain or trouble when passing urine, general feeling of discomfort or being unwell Low red blood cell level--unusual weakness or fatigue, dizziness, headache, trouble breathing Pain, tingling, or numbness in the hands or feet, muscle weakness, change in vision, confusion or trouble speaking, loss of balance or coordination, trouble walking, seizures Redness, swelling, and blistering of the skin over hands and  feet Severe or prolonged diarrhea Unusual bruising or bleeding Side effects that usually do not require medical attention (report to your care team if they continue or are bothersome): Dry skin Headache Increased tears Nausea Pain, redness, or swelling with sores inside the mouth or throat Sensitivity to light Vomiting This list may not describe all possible side effects. Call your doctor for medical advice about side effects. You may report side effects to FDA at 1-800-FDA-1088. Where should I keep my medication? This medication is given in a hospital or clinic. It will not be stored at home. NOTE: This sheet is a summary. It may not cover all possible information. If you have questions about this medicine, talk to your doctor, pharmacist, or health care provider.  2024 Elsevier/Gold Standard (2021-12-06 00:00:00)

## 2023-02-08 NOTE — Progress Notes (Signed)
[facility] OFFICE PROGRESS NOTE   Diagnosis: Colon cancer  INTERVAL HISTORY:   Ms. Buehler returns as scheduled.  She completed cycle 9 FOLFOX 01/24/2023.  She had mild nausea for a few days.  No mouth sores.  No diarrhea.  She has mild persistent tingling in the fingertips and toes.  Symptoms do not interfere with activity.  Objective:  Vital signs in last 24 hours:  Blood pressure 115/65, pulse 86, temperature 98.2 F (36.8 C), temperature source Oral, resp. rate 18, height 5\' 4"  (1.626 m), weight 138 lb 8 oz (62.8 kg), last menstrual period 03/28/2013, SpO2 100 %.    HEENT: No thrush or ulcers. Resp: Lungs clear bilaterally. Cardio: Regular rate and rhythm. GI: Abdomen soft and nontender.  No hepatomegaly. Vascular: No leg edema. Neuro: Vibratory sense is intact to minimally decreased over the fingertips per tuning fork exam. Skin: Palms without erythema. Port-A-Cath without erythema.  Lab Results:  Lab Results  Component Value Date   WBC 5.8 02/08/2023   HGB 11.1 (L) 02/08/2023   HCT 34.3 (L) 02/08/2023   MCV 95.0 02/08/2023   PLT 137 (L) 02/08/2023   NEUTROABS 3.7 02/08/2023    Imaging:  No results found.  Medications: I have reviewed the patient's current medications.  Assessment/Plan: Colon cancer, stage IIa (T3 N0 M0), sigmoidectomy 04/29/2021 Colonoscopy 03/24/2021-partially obstructing mass in the sigmoid colon, biopsy-adenocarcinoma CTs 03/28/2021-sigmoid colon mass, prominent presacral and sigmoid mesentery lymph nodes-nonspecific, no evidence of distant metastatic disease, sclerotic lesions at the left intertrochanteric region-likely a bone island Sigmoidectomy 04/29/2021-sigmoid colon tumor, 0/16 lymph nodes, no lymphovascular or perineural invasion, no loss of mismatch repair protein expression, MSS Foundation 1-MSS, tumor mutation burden 6, K-ras and NRAS wild-type, ERBB2 amplification-equivocal HER2 2+, negative by FISH Colonoscopy  04/27/2022-polyps removed from the ascending and transverse colon-tubular adenomas 05/31/2022 CEA 8.54 06/28/2022 CEA 9.48 07/10/2022 CTs-new lesion central left hepatic lobe 07/16/2022 MRI 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 Seen by Dr. Modesta Messing with recommendation for a 20-month course of chemotherapy prior to surgery Cycle 1 FOLFOX 08/09/2022 Cycle 2 FOLFOX 08/23/2022 Cycle 3 FOLFOX 09/06/2022 Cycle 4 FOLFOX 09/20/2022 Cycle 5 FOLFOX 10/04/2022 Cycle 6 FOLFOX 10/18/2022 Segment 4A/8 partial hepatectomy 11/14/2022-moderately differentiated adenocarcinoma compatible with a colorectal metastasis, 70% tumor necrosis, resection margin positive, R1 vascular margin per Dr. Modesta Messing, tumor dissected off of the vessel treated with intraoperative argon Cycle 7 FOLFOX 12/20/2022 Cycle 8 FOLFOX 01/03/2023 Cycle 9 FOLFOX 01/24/2023-Emend added Cycle 10 FOLFOX 02/08/2023, oxaliplatin dose reduced due to mild neuropathy symptoms   2.   Rectal bleeding, appointment with gastroenterology 09/25/2022-anal fissure per GI exam 09/11/2022.  Completed treatment for an anal fissure.  Flexible sigmoidoscopy 10/03/2022-moderate diverticulosis in the sigmoid colon and in the descending colon; patent end-to-side colocolonic anastomosis; anal papilla hypertrophied.  Rectal bleeding felt to be due to anal fissure.  Disposition: Ms. Flinchum appears stable.  She has completed 9 cycles of FOLFOX.  Plan to proceed with cycle 10 today as scheduled.  She is developing Oxaliplatin neuropathy.  She has mild tingling in the fingertips and toes.  We discussed holding oxaliplatin, reducing the dose, proceeding at the current dose.  She understands neuropathy symptoms will likely worsen with continuation of oxaliplatin.  She is willing to accept this risk.  She would like to try a dose reduction and reevaluate prior to the next treatment in 2 weeks.  Oxaliplatin will be dose reduced with today's treatment.  CBC reviewed.   Counts adequate to  proceed as above.  She will return for follow-up as scheduled in 2 weeks.  Plan reviewed with Dr. Truett Perna.    Lonna Cobb ANP/GNP-BC   02/08/2023  8:50 AM

## 2023-02-08 NOTE — Progress Notes (Signed)
Patient seen by Lonna Cobb NP today  Vitals are within treatment parameters.  Labs reviewed by Lonna Cobb NP and are within treatment parameters.  Per physician team, patient is ready for treatment. Please note that modifications are being made to the treatment plan including dose reduce oxaliplatin to 65mg /m2   Can you please draw her lab for a cea?

## 2023-02-08 NOTE — Addendum Note (Signed)
Addended by: Thornton Papas B on: 02/08/2023 09:45 AM   Modules accepted: Orders

## 2023-02-09 ENCOUNTER — Inpatient Hospital Stay: Payer: Medicare Other

## 2023-02-10 ENCOUNTER — Inpatient Hospital Stay: Payer: Medicare Other

## 2023-02-10 VITALS — BP 114/74 | HR 69 | Temp 98.4°F | Resp 18

## 2023-02-10 DIAGNOSIS — Z5111 Encounter for antineoplastic chemotherapy: Secondary | ICD-10-CM | POA: Diagnosis not present

## 2023-02-10 DIAGNOSIS — Z5189 Encounter for other specified aftercare: Secondary | ICD-10-CM | POA: Diagnosis not present

## 2023-02-10 DIAGNOSIS — C187 Malignant neoplasm of sigmoid colon: Secondary | ICD-10-CM | POA: Diagnosis not present

## 2023-02-10 DIAGNOSIS — Z85038 Personal history of other malignant neoplasm of large intestine: Secondary | ICD-10-CM

## 2023-02-10 MED ORDER — HEPARIN SOD (PORK) LOCK FLUSH 100 UNIT/ML IV SOLN
500.0000 [IU] | Freq: Once | INTRAVENOUS | Status: AC | PRN
  Administered 2023-02-10: 500 [IU]

## 2023-02-10 MED ORDER — SODIUM CHLORIDE 0.9% FLUSH
10.0000 mL | INTRAVENOUS | Status: DC | PRN
  Administered 2023-02-10: 10 mL

## 2023-02-10 MED ORDER — PEGFILGRASTIM-CBQV 6 MG/0.6ML ~~LOC~~ SOSY
6.0000 mg | PREFILLED_SYRINGE | Freq: Once | SUBCUTANEOUS | Status: AC
  Administered 2023-02-10: 6 mg via SUBCUTANEOUS
  Filled 2023-02-10: qty 0.6

## 2023-02-12 DIAGNOSIS — Z6823 Body mass index (BMI) 23.0-23.9, adult: Secondary | ICD-10-CM | POA: Diagnosis not present

## 2023-02-12 DIAGNOSIS — Z124 Encounter for screening for malignant neoplasm of cervix: Secondary | ICD-10-CM | POA: Diagnosis not present

## 2023-02-12 DIAGNOSIS — Z1231 Encounter for screening mammogram for malignant neoplasm of breast: Secondary | ICD-10-CM | POA: Diagnosis not present

## 2023-02-12 DIAGNOSIS — Z01419 Encounter for gynecological examination (general) (routine) without abnormal findings: Secondary | ICD-10-CM | POA: Diagnosis not present

## 2023-02-18 ENCOUNTER — Other Ambulatory Visit: Payer: Self-pay | Admitting: Oncology

## 2023-02-21 ENCOUNTER — Inpatient Hospital Stay: Payer: Medicare Other | Attending: Oncology

## 2023-02-21 ENCOUNTER — Encounter: Payer: Self-pay | Admitting: Nurse Practitioner

## 2023-02-21 ENCOUNTER — Inpatient Hospital Stay: Payer: Medicare Other

## 2023-02-21 ENCOUNTER — Inpatient Hospital Stay: Payer: Medicare Other | Admitting: Nurse Practitioner

## 2023-02-21 VITALS — BP 103/63 | HR 76 | Temp 97.6°F | Resp 18 | Wt 140.1 lb

## 2023-02-21 DIAGNOSIS — Z85038 Personal history of other malignant neoplasm of large intestine: Secondary | ICD-10-CM

## 2023-02-21 DIAGNOSIS — C187 Malignant neoplasm of sigmoid colon: Secondary | ICD-10-CM | POA: Insufficient documentation

## 2023-02-21 DIAGNOSIS — Z5111 Encounter for antineoplastic chemotherapy: Secondary | ICD-10-CM | POA: Diagnosis not present

## 2023-02-21 LAB — CBC WITH DIFFERENTIAL (CANCER CENTER ONLY)
Abs Immature Granulocytes: 0.09 10*3/uL — ABNORMAL HIGH (ref 0.00–0.07)
Basophils Absolute: 0 10*3/uL (ref 0.0–0.1)
Basophils Relative: 0 %
Eosinophils Absolute: 0 10*3/uL (ref 0.0–0.5)
Eosinophils Relative: 0 %
HCT: 31.3 % — ABNORMAL LOW (ref 36.0–46.0)
Hemoglobin: 10.3 g/dL — ABNORMAL LOW (ref 12.0–15.0)
Immature Granulocytes: 1 %
Lymphocytes Relative: 13 %
Lymphs Abs: 1 10*3/uL (ref 0.7–4.0)
MCH: 31.4 pg (ref 26.0–34.0)
MCHC: 32.9 g/dL (ref 30.0–36.0)
MCV: 95.4 fL (ref 80.0–100.0)
Monocytes Absolute: 0.8 10*3/uL (ref 0.1–1.0)
Monocytes Relative: 10 %
Neutro Abs: 5.5 10*3/uL (ref 1.7–7.7)
Neutrophils Relative %: 76 %
Platelet Count: 60 10*3/uL — ABNORMAL LOW (ref 150–400)
RBC: 3.28 MIL/uL — ABNORMAL LOW (ref 3.87–5.11)
RDW: 16.8 % — ABNORMAL HIGH (ref 11.5–15.5)
WBC Count: 7.4 10*3/uL (ref 4.0–10.5)
nRBC: 0 % (ref 0.0–0.2)

## 2023-02-21 LAB — CMP (CANCER CENTER ONLY)
ALT: 21 U/L (ref 0–44)
AST: 21 U/L (ref 15–41)
Albumin: 3.9 g/dL (ref 3.5–5.0)
Alkaline Phosphatase: 118 U/L (ref 38–126)
Anion gap: 7 (ref 5–15)
BUN: 12 mg/dL (ref 8–23)
CO2: 27 mmol/L (ref 22–32)
Calcium: 9 mg/dL (ref 8.9–10.3)
Chloride: 106 mmol/L (ref 98–111)
Creatinine: 0.59 mg/dL (ref 0.44–1.00)
GFR, Estimated: >60 mL/min (ref >60)
Glucose, Bld: 128 mg/dL — ABNORMAL HIGH (ref 70–99)
Potassium: 3.7 mmol/L (ref 3.5–5.1)
Sodium: 140 mmol/L (ref 135–145)
Total Bilirubin: 0.7 mg/dL (ref 0.3–1.2)
Total Protein: 6.2 g/dL — ABNORMAL LOW (ref 6.5–8.1)

## 2023-02-21 LAB — CEA (ACCESS): CEA (CHCC): 1.27 ng/mL (ref 0.00–5.00)

## 2023-02-21 MED ORDER — SODIUM CHLORIDE 0.9 % IV SOLN
2400.0000 mg/m2 | INTRAVENOUS | Status: DC
  Administered 2023-02-21: 4050 mg via INTRAVENOUS
  Filled 2023-02-21: qty 81

## 2023-02-21 MED ORDER — SODIUM CHLORIDE 0.9% FLUSH
10.0000 mL | INTRAVENOUS | Status: DC | PRN
  Administered 2023-02-21: 10 mL

## 2023-02-21 NOTE — Progress Notes (Signed)
[facility] OFFICE PROGRESS NOTE   Diagnosis:  Colon cancer  INTERVAL HISTORY:   Ms. Mcclees returns as scheduled.  She completed cycle 10 FOLFOX 02/08/2023.  Oxaliplatin was dose reduced due to mild neuropathy symptoms.  Neuropathy symptoms in the hands and feet have increased.  She is experiencing consistent mild numbness.  No nausea or vomiting.  No mouth sores.  No diarrhea.  She denies bleeding.  Objective:  Vital signs in last 24 hours:  Blood pressure 103/63, pulse 76, temperature 97.6 F (36.4 C), temperature source Tympanic, resp. rate 18, weight 140 lb 1.6 oz (63.5 kg), last menstrual period 03/28/2013, SpO2 100 %.    HEENT: No thrush or ulcers. Resp: Lungs clear bilaterally. Cardio: Regular rate and rhythm. GI: No hepatosplenomegaly. Vascular: No leg edema. Skin: Palms without erythema. Port-A-Cath without erythema.  Lab Results:  Lab Results  Component Value Date   WBC 7.4 02/21/2023   HGB 10.3 (L) 02/21/2023   HCT 31.3 (L) 02/21/2023   MCV 95.4 02/21/2023   PLT 60 (L) 02/21/2023   NEUTROABS 5.5 02/21/2023    Imaging:  No results found.  Medications: I have reviewed the patient's current medications.  Assessment/Plan: Colon cancer, stage IIa (T3 N0 M0), sigmoidectomy 04/29/2021 Colonoscopy 03/24/2021-partially obstructing mass in the sigmoid colon, biopsy-adenocarcinoma CTs 03/28/2021-sigmoid colon mass, prominent presacral and sigmoid mesentery lymph nodes-nonspecific, no evidence of distant metastatic disease, sclerotic lesions at the left intertrochanteric region-likely a bone island Sigmoidectomy 04/29/2021-sigmoid colon tumor, 0/16 lymph nodes, no lymphovascular or perineural invasion, no loss of mismatch repair protein expression, MSS Foundation 1-MSS, tumor mutation burden 6, K-ras and NRAS wild-type, ERBB2 amplification-equivocal HER2 2+, negative by FISH Colonoscopy 04/27/2022-polyps removed from the ascending and transverse  colon-tubular adenomas 05/31/2022 CEA 8.54 06/28/2022 CEA 9.48 07/10/2022 CTs-new lesion central left hepatic lobe 07/16/2022 MRI 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 Seen by Dr. Modesta Messing with recommendation for a 55-month course of chemotherapy prior to surgery Cycle 1 FOLFOX 08/09/2022 Cycle 2 FOLFOX 08/23/2022 Cycle 3 FOLFOX 09/06/2022 Cycle 4 FOLFOX 09/20/2022 Cycle 5 FOLFOX 10/04/2022 Cycle 6 FOLFOX 10/18/2022 Segment 4A/8 partial hepatectomy 11/14/2022-moderately differentiated adenocarcinoma compatible with a colorectal metastasis, 70% tumor necrosis, resection margin positive, R1 vascular margin per Dr. Modesta Messing, tumor dissected off of the vessel treated with intraoperative argon Cycle 7 FOLFOX 12/20/2022 Cycle 8 FOLFOX 01/03/2023 Cycle 9 FOLFOX 01/24/2023-Emend added Cycle 10 FOLFOX 02/08/2023, oxaliplatin dose reduced due to mild neuropathy symptoms Cycle 11 FOLFOX 02/21/2023, oxaliplatin held due to increased neuropathy symptoms and thrombocytopenia, 5-FU bolus held due to thrombocytopenia   2.   Rectal bleeding, appointment with gastroenterology 09/25/2022-anal fissure per GI exam 09/11/2022.  Completed treatment for an anal fissure.  Flexible sigmoidoscopy 10/03/2022-moderate diverticulosis in the sigmoid colon and in the descending colon; patent end-to-side colocolonic anastomosis; anal papilla hypertrophied.  Rectal bleeding felt to be due to anal fissure.  Disposition: Ms. Giannetti appears stable.  She has completed 10 cycles of FOLFOX chemotherapy.  Oxaliplatin was dose reduced with cycle 10 due to mild neuropathy symptoms.  The neuropathy symptoms have increased.  Review of the CBC shows thrombocytopenia, platelet count 60,000.  Oxaliplatin and 5-FU bolus will be held with this cycle, plan to proceed with the 5-FU pump alone.  She will not receive white cell growth factor support on day of pump discontinuation.  She understands to contact the office with bleeding.  She  will return for follow-up and the final cycle of FOLFOX in 2 weeks.  Plan  reviewed with Dr. Truett Perna.    Lonna Cobb ANP/GNP-BC   02/21/2023  9:26 AM

## 2023-02-21 NOTE — Patient Instructions (Addendum)
[facility]  The chemotherapy medication bag should finish at 46 hours, 96 hours, or 7 days. For example, if your pump is scheduled for 46 hours and it was put on at 4:00 p.m., it should finish at 2:00 p.m. the day it is scheduled to come off regardless of your appointment time.     Estimated time to finish at 9:15 Friday, February 23, 2023.   If the display on your pump reads "Low Volume" and it is beeping, take the batteries out of the pump and come to the cancer center for it to be taken off.   If the pump alarms go off prior to the pump reading "Low Volume" then call [phone] and someone can assist you.  If the plunger comes out and the chemotherapy medication is leaking out, please use your home chemo spill kit to clean up the spill. Do NOT use paper towels or other household products.  If you have problems or questions regarding your pump, please call either [phone] (24 hours a day) or the cancer center Monday-Friday 8:00 a.m.- 4:30 p.m. at the clinic number and we will assist you. If you are unable to get assistance, then go to the nearest Emergency Department and ask the staff to contact the IV team for assistance.   Discharge Instructions: Thank you for choosing [facility] to provide your oncology and hematology care.   If you have a lab appointment with the Cancer Center, please go directly to the Cancer Center and check in at the registration area.   Wear comfortable clothing and clothing appropriate for easy access to any Portacath or PICC line.   We strive to give you quality time with your provider. You may need to reschedule your appointment if you arrive late (15 or more minutes).  Arriving late affects you and other patients whose appointments are after yours.  Also, if you miss three or more appointments without notifying the office, you may be dismissed from the clinic at the provider's discretion.      For  prescription refill requests, have your pharmacy contact our office and allow 72 hours for refills to be completed.    Today you received the following chemotherapy and/or immunotherapy agents Fluorouracil.      To help prevent nausea and vomiting after your treatment, we encourage you to take your nausea medication as directed.  BELOW ARE SYMPTOMS THAT SHOULD BE REPORTED IMMEDIATELY: *FEVER GREATER THAN 100.4 F (38 C) OR HIGHER *CHILLS OR SWEATING *NAUSEA AND VOMITING THAT IS NOT CONTROLLED WITH YOUR NAUSEA MEDICATION *UNUSUAL SHORTNESS OF BREATH *UNUSUAL BRUISING OR BLEEDING *URINARY PROBLEMS (pain or burning when urinating, or frequent urination) *BOWEL PROBLEMS (unusual diarrhea, constipation, pain near the anus) TENDERNESS IN MOUTH AND THROAT WITH OR WITHOUT PRESENCE OF ULCERS (sore throat, sores in mouth, or a toothache) UNUSUAL RASH, SWELLING OR PAIN  UNUSUAL VAGINAL DISCHARGE OR ITCHING   Items with * indicate a potential emergency and should be followed up as soon as possible or go to the Emergency Department if any problems should occur.  Please show the CHEMOTHERAPY ALERT CARD or IMMUNOTHERAPY ALERT CARD at check-in to the Emergency Department and triage nurse.  Should you have questions after your visit or need to cancel or reschedule your appointment, please contact [facility]  Dept: [phone]  and follow the prompts.  Office hours are 8:00 a.m. to 4:30 p.m. Monday - Friday. Please note that voicemails  left after 4:00 p.m. may not be returned until the following business day.  We are closed weekends and major holidays. You have access to a nurse at all times for urgent questions. Please call the main number to the clinic Dept: [phone] and follow the prompts.   For any non-urgent questions, you may also contact your provider using MyChart. We now offer e-Visits for anyone 22 and older to request care online for non-urgent symptoms.  For details visit [email or web address].   Also download the MyChart app! Go to the app store, search "MyChart", open the app, select [place], and log in with your MyChart username and password.  Fluorouracil Injection What is this medication? FLUOROURACIL (flure oh YOOR a sil) treats some types of cancer. It works by slowing down the growth of cancer cells. This medicine may be used for other purposes; ask your health care provider or pharmacist if you have questions. COMMON BRAND NAME(S): Adrucil What should I tell my care team before I take this medication? They need to know if you have any of these conditions: Blood disorders Dihydropyrimidine dehydrogenase (DPD) deficiency Infection, such as chickenpox, cold sores, herpes Kidney disease Liver disease Poor nutrition Recent or ongoing radiation therapy An unusual or allergic reaction to fluorouracil, other medications, foods, dyes, or preservatives If you or your partner are pregnant or trying to get pregnant Breast-feeding How should I use this medication? This medication is injected into a vein. It is administered by your care team in a hospital or clinic setting. Talk to your care team about the use of this medication in children. Special care may be needed. Overdosage: If you think you have taken too much of this medicine contact a poison control center or emergency room at once. NOTE: This medicine is only for you. Do not share this medicine with others. What if I miss a dose? Keep appointments for follow-up doses. It is important not to miss your dose. Call your care team if you are unable to keep an appointment. What may interact with this medication? Do not take this medication with any of the following: Live virus vaccines This medication may also interact with the following: Medications that treat or prevent blood clots, such as warfarin, enoxaparin, dalteparin This list may not describe all possible interactions.  Give your health care provider a list of all the medicines, herbs, non-prescription drugs, or dietary supplements you use. Also tell them if you smoke, drink alcohol, or use illegal drugs. Some items may interact with your medicine. What should I watch for while using this medication? Your condition will be monitored carefully while you are receiving this medication. This medication may make you feel generally unwell. This is not uncommon as chemotherapy can affect healthy cells as well as cancer cells. Report any side effects. Continue your course of treatment even though you feel ill unless your care team tells you to stop. In some cases, you may be given additional medications to help with side effects. Follow all directions for their use. This medication may increase your risk of getting an infection. Call your care team for advice if you get a fever, chills, sore throat, or other symptoms of a cold or flu. Do not treat yourself. Try to avoid being around people who are sick. This medication may increase your risk to bruise or bleed. Call your care team if you notice any unusual bleeding. Be careful brushing or flossing your teeth or using a toothpick because you may get an infection  or bleed more easily. If you have any dental work done, tell your dentist you are receiving this medication. Avoid taking medications that contain aspirin, acetaminophen, ibuprofen, naproxen, or ketoprofen unless instructed by your care team. These medications may hide a fever. Do not treat diarrhea with over the counter products. Contact your care team if you have diarrhea that lasts more than 2 days or if it is severe and watery. This medication can make you more sensitive to the sun. Keep out of the sun. If you cannot avoid being in the sun, wear protective clothing and sunscreen. Do not use sun lamps, tanning beds, or tanning booths. Talk to your care team if you or your partner wish to become pregnant or think you  might be pregnant. This medication can cause serious birth defects if taken during pregnancy and for 3 months after the last dose. A reliable form of contraception is recommended while taking this medication and for 3 months after the last dose. Talk to your care team about effective forms of contraception. Do not father a child while taking this medication and for 3 months after the last dose. Use a condom while having sex during this time period. Do not breastfeed while taking this medication. This medication may cause infertility. Talk to your care team if you are concerned about your fertility. What side effects may I notice from receiving this medication? Side effects that you should report to your care team as soon as possible: Allergic reactions--skin rash, itching, hives, swelling of the face, lips, tongue, or throat Heart attack--pain or tightness in the chest, shoulders, arms, or jaw, nausea, shortness of breath, cold or clammy skin, feeling faint or lightheaded Heart failure--shortness of breath, swelling of the ankles, feet, or hands, sudden weight gain, unusual weakness or fatigue Heart rhythm changes--fast or irregular heartbeat, dizziness, feeling faint or lightheaded, chest pain, trouble breathing High ammonia level--unusual weakness or fatigue, confusion, loss of appetite, nausea, vomiting, seizures Infection--fever, chills, cough, sore throat, wounds that don't heal, pain or trouble when passing urine, general feeling of discomfort or being unwell Low red blood cell level--unusual weakness or fatigue, dizziness, headache, trouble breathing Pain, tingling, or numbness in the hands or feet, muscle weakness, change in vision, confusion or trouble speaking, loss of balance or coordination, trouble walking, seizures Redness, swelling, and blistering of the skin over hands and feet Severe or prolonged diarrhea Unusual bruising or bleeding Side effects that usually do not require medical  attention (report to your care team if they continue or are bothersome): Dry skin Headache Increased tears Nausea Pain, redness, or swelling with sores inside the mouth or throat Sensitivity to light Vomiting This list may not describe all possible side effects. Call your doctor for medical advice about side effects. You may report side effects to FDA at 1-800-FDA-1088. Where should I keep my medication? This medication is given in a hospital or clinic. It will not be stored at home. NOTE: This sheet is a summary. It may not cover all possible information. If you have questions about this medicine, talk to your doctor, pharmacist, or health care provider.  2024 Elsevier/Gold Standard (2021-12-06 00:00:00)

## 2023-02-21 NOTE — Progress Notes (Signed)
Patient seen by Lonna Cobb NP today  Vitals are within treatment parameters.  Labs reviewed by Lonna Cobb NP and are not all within treatment parameters. Plts 60  Per physician team, patient is ready for treatment. Please note that modifications are being made to the treatment plan including  no pre-medication and patient will receive only  5FU pump.

## 2023-02-23 ENCOUNTER — Inpatient Hospital Stay: Payer: Medicare Other

## 2023-02-23 VITALS — BP 118/68 | HR 73 | Temp 97.9°F | Resp 18

## 2023-02-23 DIAGNOSIS — Z85038 Personal history of other malignant neoplasm of large intestine: Secondary | ICD-10-CM

## 2023-02-23 DIAGNOSIS — C187 Malignant neoplasm of sigmoid colon: Secondary | ICD-10-CM | POA: Diagnosis not present

## 2023-02-23 DIAGNOSIS — Z5111 Encounter for antineoplastic chemotherapy: Secondary | ICD-10-CM | POA: Diagnosis not present

## 2023-02-23 MED ORDER — HEPARIN SOD (PORK) LOCK FLUSH 100 UNIT/ML IV SOLN
500.0000 [IU] | Freq: Once | INTRAVENOUS | Status: AC | PRN
  Administered 2023-02-23: 500 [IU]

## 2023-02-23 MED ORDER — SODIUM CHLORIDE 0.9% FLUSH
10.0000 mL | INTRAVENOUS | Status: DC | PRN
  Administered 2023-02-23: 10 mL

## 2023-02-23 NOTE — Patient Instructions (Signed)

## 2023-03-04 ENCOUNTER — Other Ambulatory Visit: Payer: Self-pay | Admitting: Oncology

## 2023-03-04 DIAGNOSIS — Z85038 Personal history of other malignant neoplasm of large intestine: Secondary | ICD-10-CM

## 2023-03-07 ENCOUNTER — Inpatient Hospital Stay: Payer: Medicare Other

## 2023-03-07 ENCOUNTER — Inpatient Hospital Stay: Payer: Medicare Other | Admitting: Nurse Practitioner

## 2023-03-07 ENCOUNTER — Encounter: Payer: Self-pay | Admitting: Nurse Practitioner

## 2023-03-07 VITALS — BP 123/61

## 2023-03-07 VITALS — BP 124/72 | HR 76 | Temp 98.1°F | Resp 18 | Ht 64.0 in | Wt 140.5 lb

## 2023-03-07 DIAGNOSIS — Z85038 Personal history of other malignant neoplasm of large intestine: Secondary | ICD-10-CM

## 2023-03-07 DIAGNOSIS — Z5111 Encounter for antineoplastic chemotherapy: Secondary | ICD-10-CM | POA: Diagnosis not present

## 2023-03-07 DIAGNOSIS — C187 Malignant neoplasm of sigmoid colon: Secondary | ICD-10-CM | POA: Diagnosis not present

## 2023-03-07 LAB — CMP (CANCER CENTER ONLY)
ALT: 16 U/L (ref 0–44)
AST: 20 U/L (ref 15–41)
Albumin: 3.9 g/dL (ref 3.5–5.0)
Alkaline Phosphatase: 86 U/L (ref 38–126)
Anion gap: 7 (ref 5–15)
BUN: 15 mg/dL (ref 8–23)
CO2: 27 mmol/L (ref 22–32)
Calcium: 9.2 mg/dL (ref 8.9–10.3)
Chloride: 106 mmol/L (ref 98–111)
Creatinine: 0.6 mg/dL (ref 0.44–1.00)
GFR, Estimated: >60 mL/min (ref >60)
Glucose, Bld: 111 mg/dL — ABNORMAL HIGH (ref 70–99)
Potassium: 3.7 mmol/L (ref 3.5–5.1)
Sodium: 140 mmol/L (ref 135–145)
Total Bilirubin: 0.5 mg/dL (ref 0.3–1.2)
Total Protein: 6.9 g/dL (ref 6.5–8.1)

## 2023-03-07 LAB — CBC WITH DIFFERENTIAL (CANCER CENTER ONLY)
Abs Immature Granulocytes: 0.02 10*3/uL (ref 0.00–0.07)
Basophils Absolute: 0 10*3/uL (ref 0.0–0.1)
Basophils Relative: 1 %
Eosinophils Absolute: 0.1 10*3/uL (ref 0.0–0.5)
Eosinophils Relative: 1 %
HCT: 33.4 % — ABNORMAL LOW (ref 36.0–46.0)
Hemoglobin: 11 g/dL — ABNORMAL LOW (ref 12.0–15.0)
Immature Granulocytes: 1 %
Lymphocytes Relative: 22 %
Lymphs Abs: 0.8 10*3/uL (ref 0.7–4.0)
MCH: 31.3 pg (ref 26.0–34.0)
MCHC: 32.9 g/dL (ref 30.0–36.0)
MCV: 94.9 fL (ref 80.0–100.0)
Monocytes Absolute: 0.4 10*3/uL (ref 0.1–1.0)
Monocytes Relative: 12 %
Neutro Abs: 2.2 10*3/uL (ref 1.7–7.7)
Neutrophils Relative %: 63 %
Platelet Count: 120 10*3/uL — ABNORMAL LOW (ref 150–400)
RBC: 3.52 MIL/uL — ABNORMAL LOW (ref 3.87–5.11)
RDW: 17 % — ABNORMAL HIGH (ref 11.5–15.5)
WBC Count: 3.5 10*3/uL — ABNORMAL LOW (ref 4.0–10.5)
nRBC: 0 % (ref 0.0–0.2)

## 2023-03-07 MED ORDER — SODIUM CHLORIDE 0.9 % IV SOLN: Freq: Once | INTRAVENOUS | Status: AC

## 2023-03-07 MED ORDER — LEUCOVORIN CALCIUM INJECTION 350 MG
400.0000 mg/m2 | Freq: Once | INTRAVENOUS | Status: AC
  Administered 2023-03-07: 676 mg via INTRAVENOUS
  Filled 2023-03-07: qty 33.8

## 2023-03-07 MED ORDER — SODIUM CHLORIDE 0.9 % IV SOLN
2400.0000 mg/m2 | INTRAVENOUS | Status: DC
  Administered 2023-03-07: 4050 mg via INTRAVENOUS
  Filled 2023-03-07: qty 81

## 2023-03-07 MED ORDER — FLUOROURACIL CHEMO INJECTION 2.5 GM/50ML
400.0000 mg/m2 | Freq: Once | INTRAVENOUS | Status: AC
  Administered 2023-03-07: 700 mg via INTRAVENOUS
  Filled 2023-03-07: qty 14

## 2023-03-07 NOTE — Progress Notes (Signed)
[facility] OFFICE PROGRESS NOTE   Diagnosis: Colon cancer  INTERVAL HISTORY:   Joan Owen returns as scheduled.  She completed cycle 11 FOLFOX 02/21/2023.  Oxaliplatin was held due to increased neuropathy symptoms and thrombocytopenia.  The 5-FU bolus was also held.  Neuropathy symptoms are stable to mildly improved.  No nausea or vomiting.  No mouth sores.  No diarrhea.  She notes fatigue.  Naps periodically during the day.  Objective:  Vital signs in last 24 hours:  Blood pressure 124/72, pulse 76, temperature 98.1 F (36.7 C), temperature source Oral, resp. rate 18, height 5\' 4"  (1.626 m), weight 140 lb 8 oz (63.7 kg), last menstrual period 03/28/2013, SpO2 99%.    HEENT: No thrush or ulcers. Resp: Lungs clear bilaterally. Cardio: Regular rate and rhythm. GI: No hepatosplenomegaly. Vascular: No leg edema. Skin: Palms without erythema. Port-A-Cath without erythema.  Lab Results:  Lab Results  Component Value Date   WBC 3.5 (L) 03/07/2023   HGB 11.0 (L) 03/07/2023   HCT 33.4 (L) 03/07/2023   MCV 94.9 03/07/2023   PLT 120 (L) 03/07/2023   NEUTROABS 2.2 03/07/2023    Imaging:  No results found.  Medications: I have reviewed the patient's current medications.  Assessment/Plan: Colon cancer, stage IIa (T3 N0 M0), sigmoidectomy 04/29/2021 Colonoscopy 03/24/2021-partially obstructing mass in the sigmoid colon, biopsy-adenocarcinoma CTs 03/28/2021-sigmoid colon mass, prominent presacral and sigmoid mesentery lymph nodes-nonspecific, no evidence of distant metastatic disease, sclerotic lesions at the left intertrochanteric region-likely a bone island Sigmoidectomy 04/29/2021-sigmoid colon tumor, 0/16 lymph nodes, no lymphovascular or perineural invasion, no loss of mismatch repair protein expression, MSS Foundation 1-MSS, tumor mutation burden 6, K-ras and NRAS wild-type, ERBB2 amplification-equivocal HER2 2+, negative by FISH Colonoscopy 04/27/2022-polyps  removed from the ascending and transverse colon-tubular adenomas 05/31/2022 CEA 8.54 06/28/2022 CEA 9.48 07/10/2022 CTs-new lesion central left hepatic lobe 07/16/2022 MRI 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 Seen by Dr. Modesta Messing with recommendation for a 13-month course of chemotherapy prior to surgery Cycle 1 FOLFOX 08/09/2022 Cycle 2 FOLFOX 08/23/2022 Cycle 3 FOLFOX 09/06/2022 Cycle 4 FOLFOX 09/20/2022 Cycle 5 FOLFOX 10/04/2022 Cycle 6 FOLFOX 10/18/2022 Segment 4A/8 partial hepatectomy 11/14/2022-moderately differentiated adenocarcinoma compatible with a colorectal metastasis, 70% tumor necrosis, resection margin positive, R1 vascular margin per Dr. Modesta Messing, tumor dissected off of the vessel treated with intraoperative argon Cycle 7 FOLFOX 12/20/2022 Cycle 8 FOLFOX 01/03/2023 Cycle 9 FOLFOX 01/24/2023-Emend added Cycle 10 FOLFOX 02/08/2023, oxaliplatin dose reduced due to mild neuropathy symptoms Cycle 11 FOLFOX 02/21/2023, oxaliplatin held due to increased neuropathy symptoms and thrombocytopenia, 5-FU bolus held due to thrombocytopenia Cycle 12 FOLFOX 03/07/2023, oxaliplatin held due to neuropathy   2.   Rectal bleeding, appointment with gastroenterology 09/25/2022-anal fissure per GI exam 09/11/2022.  Completed treatment for an anal fissure.  Flexible sigmoidoscopy 10/03/2022-moderate diverticulosis in the sigmoid colon and in the descending colon; patent end-to-side colocolonic anastomosis; anal papilla hypertrophied.  Rectal bleeding felt to be due to anal fissure.  Disposition: [name] appears stable.  She has completed 11 cycles of FOLFOX.  Oxaliplatin was held with cycle 11 due to increased neuropathy symptoms and thrombocytopenia.  5-FU bolus was also held.  Plan to proceed with the 12th and final cycle today as scheduled.  We will continue to hold oxaliplatin due to neuropathy.  She will receive the remainder of the regimen including the 5-FU bolus.  CBC and chemistry  panel reviewed.  Labs adequate for treatment today.  White count is  mildly low, ANC in normal range.  She has mild thrombocytopenia.  She understands to contact the office with fever, chills, other signs of infection, bleeding.  She has an appointment at [facility] on 03/19/2023 and will request a CBC.  She will return for follow-up in 1 month.  We are available to see her sooner if needed.  Lonna Cobb ANP/GNP-BC   03/07/2023  10:03 AM

## 2023-03-07 NOTE — Progress Notes (Signed)
Patient seen by Lonna Cobb NP today  Vitals are within treatment parameters.  Labs reviewed by Lonna Cobb NP and are within treatment parameters.  Per physician team, patient is ready for treatment. Please note that modifications are being made to the treatment plan including no Oxaliplatin today per Lonna Cobb, NP. Pt will receive Leucovorin, and 5FU doses only today.

## 2023-03-07 NOTE — Patient Instructions (Signed)

## 2023-03-07 NOTE — Patient Instructions (Addendum)
[facility]  The chemotherapy medication bag should finish at 46 hours, 96 hours, or 7 days. For example, if your pump is scheduled for 46 hours and it was put on at 4:00 p.m., it should finish at 2:00 p.m. the day it is scheduled to come off regardless of your appointment time.     Estimated time to finish at 10:00am Friday, March 09, 2023.   If the display on your pump reads "Low Volume" and it is beeping, take the batteries out of the pump and come to the cancer center for it to be taken off.   If the pump alarms go off prior to the pump reading "Low Volume" then call [phone] and someone can assist you.  If the plunger comes out and the chemotherapy medication is leaking out, please use your home chemo spill kit to clean up the spill. Do NOT use paper towels or other household products.  If you have problems or questions regarding your pump, please call either [phone] (24 hours a day) or the cancer center Monday-Friday 8:00 a.m.- 4:30 p.m. at the clinic number and we will assist you. If you are unable to get assistance, then go to the nearest Emergency Department and ask the staff to contact the IV team for assistance.   Discharge Instructions: Thank you for choosing [facility] to provide your oncology and hematology care.   If you have a lab appointment with the Cancer Center, please go directly to the Cancer Center and check in at the registration area.   Wear comfortable clothing and clothing appropriate for easy access to any Portacath or PICC line.   We strive to give you quality time with your provider. You may need to reschedule your appointment if you arrive late (15 or more minutes).  Arriving late affects you and other patients whose appointments are after yours.  Also, if you miss three or more appointments without notifying the office, you may be dismissed from the clinic at the provider's discretion.      For  prescription refill requests, have your pharmacy contact our office and allow 72 hours for refills to be completed.    Today you received the following chemotherapy and/or immunotherapy agents: leucovorin, Fluorouracil.      To help prevent nausea and vomiting after your treatment, we encourage you to take your nausea medication as directed.  BELOW ARE SYMPTOMS THAT SHOULD BE REPORTED IMMEDIATELY: *FEVER GREATER THAN 100.4 F (38 C) OR HIGHER *CHILLS OR SWEATING *NAUSEA AND VOMITING THAT IS NOT CONTROLLED WITH YOUR NAUSEA MEDICATION *UNUSUAL SHORTNESS OF BREATH *UNUSUAL BRUISING OR BLEEDING *URINARY PROBLEMS (pain or burning when urinating, or frequent urination) *BOWEL PROBLEMS (unusual diarrhea, constipation, pain near the anus) TENDERNESS IN MOUTH AND THROAT WITH OR WITHOUT PRESENCE OF ULCERS (sore throat, sores in mouth, or a toothache) UNUSUAL RASH, SWELLING OR PAIN  UNUSUAL VAGINAL DISCHARGE OR ITCHING   Items with * indicate a potential emergency and should be followed up as soon as possible or go to the Emergency Department if any problems should occur.  Please show the CHEMOTHERAPY ALERT CARD or IMMUNOTHERAPY ALERT CARD at check-in to the Emergency Department and triage nurse.  Should you have questions after your visit or need to cancel or reschedule your appointment, please contact [facility]  Dept: [phone]  and follow the prompts.  Office hours are 8:00 a.m. to 4:30 p.m. Monday - Friday. Please note that  voicemails left after 4:00 p.m. may not be returned until the following business day.  We are closed weekends and major holidays. You have access to a nurse at all times for urgent questions. Please call the main number to the clinic Dept: [phone] and follow the prompts.   For any non-urgent questions, you may also contact your provider using MyChart. We now offer e-Visits for anyone 63 and older to request care online for non-urgent  symptoms. For details visit [email or web address].   Also download the MyChart app! Go to the app store, search "MyChart", open the app, select [facility], and log in with your MyChart username and password.  Fluorouracil Injection What is this medication? FLUOROURACIL (flure oh YOOR a sil) treats some types of cancer. It works by slowing down the growth of cancer cells. This medicine may be used for other purposes; ask your health care provider or pharmacist if you have questions. COMMON BRAND NAME(S): Adrucil What should I tell my care team before I take this medication? They need to know if you have any of these conditions: Blood disorders Dihydropyrimidine dehydrogenase (DPD) deficiency Infection, such as chickenpox, cold sores, herpes Kidney disease Liver disease Poor nutrition Recent or ongoing radiation therapy An unusual or allergic reaction to fluorouracil, other medications, foods, dyes, or preservatives If you or your partner are pregnant or trying to get pregnant Breast-feeding How should I use this medication? This medication is injected into a vein. It is administered by your care team in a hospital or clinic setting. Talk to your care team about the use of this medication in children. Special care may be needed. Overdosage: If you think you have taken too much of this medicine contact a poison control center or emergency room at once. NOTE: This medicine is only for you. Do not share this medicine with others. What if I miss a dose? Keep appointments for follow-up doses. It is important not to miss your dose. Call your care team if you are unable to keep an appointment. What may interact with this medication? Do not take this medication with any of the following: Live virus vaccines This medication may also interact with the following: Medications that treat or prevent blood clots, such as warfarin, enoxaparin, dalteparin This list may not describe all possible  interactions. Give your health care provider a list of all the medicines, herbs, non-prescription drugs, or dietary supplements you use. Also tell them if you smoke, drink alcohol, or use illegal drugs. Some items may interact with your medicine. What should I watch for while using this medication? Your condition will be monitored carefully while you are receiving this medication. This medication may make you feel generally unwell. This is not uncommon as chemotherapy can affect healthy cells as well as cancer cells. Report any side effects. Continue your course of treatment even though you feel ill unless your care team tells you to stop. In some cases, you may be given additional medications to help with side effects. Follow all directions for their use. This medication may increase your risk of getting an infection. Call your care team for advice if you get a fever, chills, sore throat, or other symptoms of a cold or flu. Do not treat yourself. Try to avoid being around people who are sick. This medication may increase your risk to bruise or bleed. Call your care team if you notice any unusual bleeding. Be careful brushing or flossing your teeth or using a toothpick because you may get an  infection or bleed more easily. If you have any dental work done, tell your dentist you are receiving this medication. Avoid taking medications that contain aspirin, acetaminophen, ibuprofen, naproxen, or ketoprofen unless instructed by your care team. These medications may hide a fever. Do not treat diarrhea with over the counter products. Contact your care team if you have diarrhea that lasts more than 2 days or if it is severe and watery. This medication can make you more sensitive to the sun. Keep out of the sun. If you cannot avoid being in the sun, wear protective clothing and sunscreen. Do not use sun lamps, tanning beds, or tanning booths. Talk to your care team if you or your partner wish to become pregnant  or think you might be pregnant. This medication can cause serious birth defects if taken during pregnancy and for 3 months after the last dose. A reliable form of contraception is recommended while taking this medication and for 3 months after the last dose. Talk to your care team about effective forms of contraception. Do not father a child while taking this medication and for 3 months after the last dose. Use a condom while having sex during this time period. Do not breastfeed while taking this medication. This medication may cause infertility. Talk to your care team if you are concerned about your fertility. What side effects may I notice from receiving this medication? Side effects that you should report to your care team as soon as possible: Allergic reactions--skin rash, itching, hives, swelling of the face, lips, tongue, or throat Heart attack--pain or tightness in the chest, shoulders, arms, or jaw, nausea, shortness of breath, cold or clammy skin, feeling faint or lightheaded Heart failure--shortness of breath, swelling of the ankles, feet, or hands, sudden weight gain, unusual weakness or fatigue Heart rhythm changes--fast or irregular heartbeat, dizziness, feeling faint or lightheaded, chest pain, trouble breathing High ammonia level--unusual weakness or fatigue, confusion, loss of appetite, nausea, vomiting, seizures Infection--fever, chills, cough, sore throat, wounds that don't heal, pain or trouble when passing urine, general feeling of discomfort or being unwell Low red blood cell level--unusual weakness or fatigue, dizziness, headache, trouble breathing Pain, tingling, or numbness in the hands or feet, muscle weakness, change in vision, confusion or trouble speaking, loss of balance or coordination, trouble walking, seizures Redness, swelling, and blistering of the skin over hands and feet Severe or prolonged diarrhea Unusual bruising or bleeding Side effects that usually do not  require medical attention (report to your care team if they continue or are bothersome): Dry skin Headache Increased tears Nausea Pain, redness, or swelling with sores inside the mouth or throat Sensitivity to light Vomiting This list may not describe all possible side effects. Call your doctor for medical advice about side effects. You may report side effects to FDA at 1-800-FDA-1088. Where should I keep my medication? This medication is given in a hospital or clinic. It will not be stored at home. NOTE: This sheet is a summary. It may not cover all possible information. If you have questions about this medicine, talk to your doctor, pharmacist, or health care provider.  2024 Elsevier/Gold Standard (2021-12-06 00:00:00)

## 2023-03-08 ENCOUNTER — Other Ambulatory Visit: Payer: Self-pay

## 2023-03-09 ENCOUNTER — Inpatient Hospital Stay: Payer: Medicare Other

## 2023-03-09 VITALS — BP 133/72 | HR 91 | Temp 98.1°F | Resp 18

## 2023-03-09 DIAGNOSIS — Z5111 Encounter for antineoplastic chemotherapy: Secondary | ICD-10-CM | POA: Diagnosis not present

## 2023-03-09 DIAGNOSIS — Z85038 Personal history of other malignant neoplasm of large intestine: Secondary | ICD-10-CM

## 2023-03-09 DIAGNOSIS — C187 Malignant neoplasm of sigmoid colon: Secondary | ICD-10-CM | POA: Diagnosis not present

## 2023-03-09 MED ORDER — HEPARIN SOD (PORK) LOCK FLUSH 100 UNIT/ML IV SOLN
500.0000 [IU] | Freq: Once | INTRAVENOUS | Status: AC | PRN
  Administered 2023-03-09: 500 [IU]

## 2023-03-09 MED ORDER — SODIUM CHLORIDE 0.9% FLUSH
10.0000 mL | INTRAVENOUS | Status: DC | PRN
  Administered 2023-03-09: 10 mL

## 2023-03-09 NOTE — Patient Instructions (Signed)

## 2023-03-11 NOTE — Assessment & Plan Note (Signed)
Patient is doing well after treatment for stage 2a cancer.

## 2023-03-11 NOTE — Assessment & Plan Note (Signed)
Encouraged to get adequate exercise, calcium and vitamin d intake 

## 2023-03-11 NOTE — Assessment & Plan Note (Signed)
Supplement and monitor 

## 2023-03-11 NOTE — Assessment & Plan Note (Signed)
Increase leafy greens, consider increased lean red meat and using cast iron cookware. Continue to monitor, report any concerns 

## 2023-03-11 NOTE — Assessment & Plan Note (Signed)
Encourage heart healthy diet such as MIND or DASH diet, increase exercise, avoid trans fats, simple carbohydrates and processed foods, consider a krill or fish or flaxseed oil cap daily.  °

## 2023-03-11 NOTE — Progress Notes (Unsigned)
Subjective:    Patient ID: Joan Owen, female    DOB: 08-Oct-1955, 67 y.o.   MRN: [id number]  No chief complaint on file.   HPI Discussed the use of AI scribe software for clinical note transcription with the patient, who gave verbal consent to proceed.  History of Present Illness        Patient is a 67 yo female in today for annual preventative exam and for follow up on chronic medical concerns. No recent febrile illness or hospitalizations. Denies CP/palp/SOB/HA/congestion/fevers/GI or GU c/o. Taking meds as prescribed     Past Medical History:  Diagnosis Date  . Anemia   . BCC (basal cell carcinoma), face 01/05/2015   Lip  . Colon cancer (HCC) 04/29/2021  . Colon polyps    hyperplastic  . Endometriosis   . Gallstones 1981  . History of chicken pox   . Osteopenia 01/05/2015  . Osteoporosis   . PONV (postoperative nausea and vomiting)   . Right shoulder pain 01/05/2015    Past Surgical History:  Procedure Laterality Date  . BASAL CELL CARCINOMA EXCISION     upper lip  . CHOLECYSTECTOMY  08/14/1978  . COLECTOMY  04/29/2021  . COLONOSCOPY    . IR IMAGING GUIDED PORT INSERTION  07/31/2022  . LAPAROSCOPY  08/15/1983    Family History  Problem Relation Age of Onset  . Dementia Mother        Alzheimer  . Alzheimer's disease Mother   . Heart disease Father   . Cancer Sister        skin  . Dementia Sister   . Alzheimer's disease Sister   . Cancer Brother        skin  . Thyroid nodules Daughter   . Colon cancer Neg Hx   . Esophageal cancer Neg Hx   . Rectal cancer Neg Hx   . Stomach cancer Neg Hx   . Colon polyps Neg Hx     Social History   Socioeconomic History  . Marital status: Married    Spouse name: Not on file  . Number of children: 3  . Years of education: Not on file  . Highest education level: Not on file  Occupational History  . Occupation: Network engineer  Tobacco Use  . Smoking status: Never  . Smokeless tobacco: Never  Vaping Use   . Vaping status: Never Used  Substance and Sexual Activity  . Alcohol use: Not Currently    Alcohol/week: 2.0 standard drinks of alcohol    Types: 2 Glasses of wine per week  . Drug use: No  . Sexual activity: Not on file  Other Topics Concern  . Not on file  Social History Narrative  . Not on file   Social Determinants of Health   Financial Resource Strain: Low Risk  (11/15/2022)   Received [facility]   Overall Financial Resource Strain (CARDIA)   . Difficulty of Paying Living Expenses: Not hard at all  Food Insecurity: No Food Insecurity (11/20/2022)   Hunger Vital Sign   . Worried About Programme researcher, broadcasting/film/video in the Last Year: Never true   . Ran Out of Food in the Last Year: Never true  Transportation Needs: No Transportation Needs (11/20/2022)   PRAPARE - Transportation   . Lack of Transportation (Medical): No   . Lack of Transportation (Non-Medical): No  Physical Activity: Not on file  Stress: Not on file  Social Connections: Not on  file  Intimate Partner Violence: Not At Risk (08/08/2022)   Humiliation, Afraid, Rape, and Kick questionnaire   . Fear of Current or Ex-Partner: No   . Emotionally Abused: No   . Physically Abused: No   . Sexually Abused: No    Outpatient Medications Prior to Visit  Medication Sig Dispense Refill  . AMBULATORY NON FORMULARY MEDICATION Medication Name: Diltiazem 2% Lidocaine 5% Fissure Ointment   Apply a small amount inside the rectum and to the external anal area 3 times daily for 6 weeks. (Patient not taking: Reported on 03/07/2023) 30 g 0  . cyanocobalamin (VITAMIN B12) 1000 MCG tablet Take 1 tablet by mouth daily. (Patient not taking: Reported on 12/20/2022)    . ibuprofen (ADVIL) 200 MG tablet Take 400 mg by mouth every 6 (six) hours as needed for moderate pain. (Patient not taking: Reported on 01/24/2023)    . LORazepam (ATIVAN) 0.5 MG tablet Place 1 tablet (0.5 mg total) under the tongue  every 6 (six) hours as needed for anxiety. (Patient not taking: Reported on 02/08/2023) 30 tablet 1  . ondansetron (ZOFRAN) 8 MG tablet Take 1 tablet (8 mg total) by mouth every 8 (eight) hours as needed for nausea or vomiting (after chemo day 3 as needed for nausea). (Patient not taking: Reported on 02/08/2023) 20 tablet 0  . Polyethylene Glycol 3350 (MIRALAX PO) Take 17 g by mouth daily as needed. (Patient not taking: Reported on 01/24/2023)    . prochlorperazine (COMPAZINE) 10 MG tablet Take 1 tablet (10 mg total) by mouth every 6 (six) hours as needed for nausea or vomiting. (Patient not taking: Reported on 02/08/2023) 30 tablet 0   No facility-administered medications prior to visit.    No Known Allergies  Review of Systems  Constitutional:  Negative for chills, fever and malaise/fatigue.  HENT:  Negative for congestion and hearing loss.   Eyes:  Negative for discharge.  Respiratory:  Negative for cough, sputum production and shortness of breath.   Cardiovascular:  Negative for chest pain, palpitations and leg swelling.  Gastrointestinal:  Negative for abdominal pain, blood in stool, constipation, diarrhea, heartburn, nausea and vomiting.  Genitourinary:  Negative for dysuria, frequency, hematuria and urgency.  Musculoskeletal:  Negative for back pain, falls and myalgias.  Skin:  Negative for rash.  Neurological:  Negative for dizziness, sensory change, loss of consciousness, weakness and headaches.  Endo/Heme/Allergies:  Negative for environmental allergies. Does not bruise/bleed easily.  Psychiatric/Behavioral:  Negative for depression and suicidal ideas. The patient is not nervous/anxious and does not have insomnia.       Objective:    Physical Exam Constitutional:      General: She is not in acute distress.    Appearance: Normal appearance. She is not diaphoretic.  HENT:     Head: Normocephalic and atraumatic.     Right Ear: Tympanic membrane, ear canal and external ear normal.      Left Ear: Tympanic membrane, ear canal and external ear normal.     Nose: Nose normal.     Mouth/Throat:     Mouth: Mucous membranes are moist.     Pharynx: Oropharynx is clear. No oropharyngeal exudate.  Eyes:     General: No scleral icterus.       Right eye: No discharge.        Left eye: No discharge.     Conjunctiva/sclera: Conjunctivae normal.     Pupils: Pupils are equal, round, and reactive to light.  Neck:  Thyroid: No thyromegaly.  Cardiovascular:     Rate and Rhythm: Normal rate and regular rhythm.     Heart sounds: Normal heart sounds. No murmur heard. Pulmonary:     Effort: Pulmonary effort is normal. No respiratory distress.     Breath sounds: Normal breath sounds. No wheezing or rales.  Abdominal:     General: Bowel sounds are normal. There is no distension.     Palpations: Abdomen is soft. There is no mass.     Tenderness: There is no abdominal tenderness.  Musculoskeletal:        General: No tenderness. Normal range of motion.     Cervical back: Normal range of motion and neck supple.  Lymphadenopathy:     Cervical: No cervical adenopathy.  Skin:    General: Skin is warm and dry.     Findings: No rash.  Neurological:     General: No focal deficit present.     Mental Status: She is alert and oriented to person, place, and time.     Cranial Nerves: No cranial nerve deficit.     Coordination: Coordination normal.     Deep Tendon Reflexes: Reflexes are normal and symmetric. Reflexes normal.  Psychiatric:        Mood and Affect: Mood normal.        Behavior: Behavior normal.        Thought Content: Thought content normal.        Judgment: Judgment normal.   LMP 03/28/2013  Wt Readings from Last 3 Encounters:  03/07/23 140 lb 8 oz (63.7 kg)  02/21/23 140 lb 1.6 oz (63.5 kg)  02/08/23 138 lb 8 oz (62.8 kg)    Diabetic Foot Exam - Simple   No data filed    Lab Results  Component Value Date   WBC 3.5 (L) 03/07/2023   HGB 11.0 (L) 03/07/2023    HCT 33.4 (L) 03/07/2023   PLT 120 (L) 03/07/2023   GLUCOSE 111 (H) 03/07/2023   CHOL 191 11/08/2021   TRIG 143.0 11/08/2021   HDL 58.20 11/08/2021   LDLDIRECT 103.0 11/04/2019   LDLCALC 104 (H) 11/08/2021   ALT 16 03/07/2023   AST 20 03/07/2023   NA 140 03/07/2023   K 3.7 03/07/2023   CL 106 03/07/2023   CREATININE 0.60 03/07/2023   BUN 15 03/07/2023   CO2 27 03/07/2023   TSH 1.09 11/02/2020    Lab Results  Component Value Date   TSH 1.09 11/02/2020   Lab Results  Component Value Date   WBC 3.5 (L) 03/07/2023   HGB 11.0 (L) 03/07/2023   HCT 33.4 (L) 03/07/2023   MCV 94.9 03/07/2023   PLT 120 (L) 03/07/2023   Lab Results  Component Value Date   NA 140 03/07/2023   K 3.7 03/07/2023   CO2 27 03/07/2023   GLUCOSE 111 (H) 03/07/2023   BUN 15 03/07/2023   CREATININE 0.60 03/07/2023   BILITOT 0.5 03/07/2023   ALKPHOS 86 03/07/2023   AST 20 03/07/2023   ALT 16 03/07/2023   PROT 6.9 03/07/2023   ALBUMIN 3.9 03/07/2023   CALCIUM 9.2 03/07/2023   ANIONGAP 7 03/07/2023   GFR 70.37 11/08/2021   Lab Results  Component Value Date   CHOL 191 11/08/2021   Lab Results  Component Value Date   HDL 58.20 11/08/2021   Lab Results  Component Value Date   LDLCALC 104 (H) 11/08/2021   Lab Results  Component Value Date   TRIG 143.0 11/08/2021  Lab Results  Component Value Date   CHOLHDL 3 11/08/2021   No results found for: "HGBA1C"     Assessment & Plan:  Anemia, unspecified type Assessment & Plan: Increase leafy greens, consider increased lean red meat and using cast iron cookware. Continue to monitor, report any concerns    Hyperlipidemia, unspecified hyperlipidemia type Assessment & Plan: Encourage heart healthy diet such as MIND or DASH diet, increase exercise, avoid trans fats, simple carbohydrates and processed foods, consider a krill or fish or flaxseed oil cap daily.    Osteoporosis, unspecified osteoporosis type, unspecified pathological fracture  presence Assessment & Plan: Encouraged to get adequate exercise, calcium and vitamin d intake    Vitamin D deficiency Assessment & Plan: Supplement and monitor   History of colon cancer Assessment & Plan: Patient is doing well after treatment for stage 2a cancer.      Assessment and Plan              Danise Edge, MD

## 2023-03-12 ENCOUNTER — Encounter: Payer: Self-pay | Admitting: Family Medicine

## 2023-03-12 ENCOUNTER — Ambulatory Visit (INDEPENDENT_AMBULATORY_CARE_PROVIDER_SITE_OTHER): Payer: Medicare Other | Admitting: Family Medicine

## 2023-03-12 VITALS — BP 122/68 | HR 88 | Temp 97.8°F | Resp 16 | Ht 64.0 in | Wt 141.0 lb

## 2023-03-12 DIAGNOSIS — Z85038 Personal history of other malignant neoplasm of large intestine: Secondary | ICD-10-CM

## 2023-03-12 DIAGNOSIS — E785 Hyperlipidemia, unspecified: Secondary | ICD-10-CM | POA: Diagnosis not present

## 2023-03-12 DIAGNOSIS — D649 Anemia, unspecified: Secondary | ICD-10-CM

## 2023-03-12 DIAGNOSIS — M810 Age-related osteoporosis without current pathological fracture: Secondary | ICD-10-CM | POA: Diagnosis not present

## 2023-03-12 DIAGNOSIS — R739 Hyperglycemia, unspecified: Secondary | ICD-10-CM

## 2023-03-12 DIAGNOSIS — E559 Vitamin D deficiency, unspecified: Secondary | ICD-10-CM | POA: Diagnosis not present

## 2023-03-12 DIAGNOSIS — Z0000 Encounter for general adult medical examination without abnormal findings: Secondary | ICD-10-CM | POA: Diagnosis not present

## 2023-03-12 NOTE — Patient Instructions (Addendum)
4000, 8000 steps a day 60-80 ounces of fluids daily Multivitamin with minerals, fish or krill oil caps, Vitamin D 3 2000 international units  daily and a daily probiotic  Shingrix is the new shingles shot, 2 shots over 2-6 months, confirm coverage with insurance and document, then can return here for shots with nurse appt or at pharmacy   Prevnar 20 once at pharmacy  COVID and flu boosters in fall  Preventive Care 65 Years and Older, Female Preventive care refers to lifestyle choices and   visits with your health care provider that can promote health and wellness. Preventive care visits are also called wellness exams. What can I expect for my preventive care visit? Counseling Your health care provider may ask you questions about your: Medical history, including: Past medical problems. Family medical history. Pregnancy and menstrual history. History of falls. Current health, including: Memory and ability to understand (cognition). Emotional well-being. Home life and relationship well-being. Sexual activity and sexual health. Lifestyle, including: Alcohol, nicotine or tobacco, and drug use. Access to firearms. Diet, exercise, and sleep habits. Work and work Astronomer. Sunscreen use. Safety issues such as seatbelt and bike helmet use. Physical exam Your health care provider will check your: Height and weight. These may be used to calculate your BMI (body mass index). BMI is a measurement that tells if you are at a healthy weight. Waist circumference. This measures the distance around your waistline. This measurement also tells if you are at a healthy weight and may help predict your risk of certain diseases, such as type 2 diabetes and high blood pressure. Heart rate and blood pressure. Body temperature. Skin for abnormal spots. What immunizations do I need?  Vaccines are usually given at various ages, according to a schedule. Your health care provider will recommend  vaccines for you based on your age, medical history, and lifestyle or other factors, such as travel or where you work. What tests do I need? Screening Your health care provider may recommend screening tests for certain conditions. This may include: Lipid and cholesterol levels. Hepatitis C test. Hepatitis B test. HIV (human immunodeficiency virus) test. STI (sexually transmitted infection) testing, if you are at risk. Lung cancer screening. Colorectal cancer screening. Diabetes screening. This is done by checking your blood sugar (glucose) after you have not eaten for a while (fasting). Mammogram. Talk with your health care provider about how often you should have regular mammograms. BRCA-related cancer screening. This may be done if you have a family history of breast, ovarian, tubal, or peritoneal cancers. Bone density scan. This is done to screen for osteoporosis. Talk with your health care provider about your test results, treatment options, and if necessary, the need for more tests. Follow these instructions at home: Eating and drinking  Eat a diet that includes fresh fruits and vegetables, whole grains, lean protein, and low-fat dairy products. Limit your intake of foods with high amounts of sugar, saturated fats, and salt. Take vitamin and mineral supplements as recommended by your health care provider. Do not drink alcohol if your health care provider tells you not to drink. If you drink alcohol: Limit how much you have to 0-1 drink a day. Know how much alcohol is in your drink. In the [place] one drink equals one 12 oz bottle of beer (355 mL), one 5 oz glass of wine (148 mL), or one 1 oz glass of hard liquor (44 mL). Lifestyle Brush your teeth every morning and night with fluoride toothpaste. Floss one time  each day. Exercise for at least 30 minutes 5 or more days each week. Do not use any products that contain nicotine or tobacco. These products include cigarettes, chewing  tobacco, and vaping devices, such as e-cigarettes. If you need help quitting, ask your health care provider. Do not use drugs. If you are sexually active, practice safe sex. Use a condom or other form of protection in order to prevent STIs. Take aspirin only as told by your health care provider. Make sure that you understand how much to take and what form to take. Work with your health care provider to find out whether it is safe and beneficial for you to take aspirin daily. Ask your health care provider if you need to take a cholesterol-lowering medicine (statin). Find healthy ways to manage stress, such as: Meditation, yoga, or listening to music. Journaling. Talking to a trusted person. Spending time with friends and family. Minimize exposure to UV radiation to reduce your risk of skin cancer. Safety Always wear your seat belt while driving or riding in a vehicle. Do not drive: If you have been drinking alcohol. Do not ride with someone who has been drinking. When you are tired or distracted. While texting. If you have been using any mind-altering substances or drugs. Wear a helmet and other protective equipment during sports activities. If you have firearms in your house, make sure you follow all gun safety procedures. What's next? Visit your health care provider once a year for an annual wellness visit. Ask your health care provider how often you should have your eyes and teeth checked. Stay up to date on all vaccines. This information is not intended to replace advice given to you by your health care provider. Make sure you discuss any questions you have with your health care provider. Document Revised: 01/26/2021 Document Reviewed: 01/26/2021 Elsevier Patient Education  2024 [employer].

## 2023-03-12 NOTE — Assessment & Plan Note (Signed)
Patient encouraged to maintain heart healthy diet, regular exercise, adequate sleep. Consider daily probiotics. Take medications as prescribed. Labs ordered and reviewed. Follows with OB/GYN for pap, MGM and Dexa. Will request records, Colonoscopy just completed showed colon cancer and required surgery she is doing  Much better now. Encouraged to proceed with Shingrix shots

## 2023-03-13 ENCOUNTER — Other Ambulatory Visit: Payer: Self-pay

## 2023-03-13 ENCOUNTER — Encounter: Payer: Self-pay | Admitting: Oncology

## 2023-03-19 ENCOUNTER — Telehealth: Payer: Self-pay | Admitting: Internal Medicine

## 2023-03-19 DIAGNOSIS — C189 Malignant neoplasm of colon, unspecified: Secondary | ICD-10-CM | POA: Diagnosis not present

## 2023-03-19 DIAGNOSIS — Z79899 Other long term (current) drug therapy: Secondary | ICD-10-CM | POA: Diagnosis not present

## 2023-03-19 DIAGNOSIS — C19 Malignant neoplasm of rectosigmoid junction: Secondary | ICD-10-CM | POA: Diagnosis not present

## 2023-03-19 DIAGNOSIS — C787 Secondary malignant neoplasm of liver and intrahepatic bile duct: Secondary | ICD-10-CM | POA: Diagnosis not present

## 2023-03-19 NOTE — Telephone Encounter (Signed)
Pt had CT done at [facility] and was told she needed to schedule a colon asap. See results below:  Impression:  1. New sigmoid asymmetric wall thickening proximal to the rectosigmoid  anastomosis. Recommend further evaluation with colonoscopy.  2.  Status post right partial hepatectomy.   Findings were communicated to [facility] by Noah Charon on 03/19/2023  11:35 AM.   Pt states she has been undergoing chem tx for the past 2 years at [facility]. First available appt in [facility] is 8/29 at 4pm. Please advise if this is soon enough for pt, didn't know if you may want to add and early appt.

## 2023-03-19 NOTE — Telephone Encounter (Signed)
Inbound call from patient stating she had imaging done today 8/5 at [facility] and there were abnormal finding and advised patient to schedule colonoscopy as soon as possible. Patient requesting a call back to discuss results further and scheduling. Please advise, thank you.

## 2023-03-20 NOTE — Telephone Encounter (Signed)
Offer 12:00 PM (Noon) on 03/22/23 I cannot do 730 on that day due to a meeting JMP

## 2023-03-20 NOTE — Telephone Encounter (Signed)
Has the [facility] approved that time for the procedure?

## 2023-03-21 ENCOUNTER — Other Ambulatory Visit (HOSPITAL_BASED_OUTPATIENT_CLINIC_OR_DEPARTMENT_OTHER): Payer: Self-pay

## 2023-03-21 ENCOUNTER — Encounter: Payer: Self-pay | Admitting: Internal Medicine

## 2023-03-21 ENCOUNTER — Other Ambulatory Visit: Payer: Self-pay

## 2023-03-21 DIAGNOSIS — R935 Abnormal findings on diagnostic imaging of other abdominal regions, including retroperitoneum: Secondary | ICD-10-CM

## 2023-03-21 DIAGNOSIS — Z85038 Personal history of other malignant neoplasm of large intestine: Secondary | ICD-10-CM

## 2023-03-21 MED ORDER — SUTAB 1479-225-188 MG PO TABS: ORAL_TABLET | ORAL | 0 refills | Status: DC

## 2023-03-21 NOTE — Telephone Encounter (Signed)
Trish,  I would like to add on a noon case tomorrow -- okay with you? EGDs in my schedule tomorrow will hopefully allow for 1230 completion time JMP

## 2023-03-21 NOTE — Telephone Encounter (Signed)
Time ok'd by Rosann Auerbach. Pt scheduled in the [facility] for colon 03/22/23 at 12noon. Pt to arrive at 11am. Amb ref in epic. Pt sent colon instructions for sutab via mychart. Script sent to the pharmacy.

## 2023-03-22 ENCOUNTER — Ambulatory Visit (AMBULATORY_SURGERY_CENTER): Payer: Medicare Other | Admitting: Internal Medicine

## 2023-03-22 ENCOUNTER — Encounter: Payer: Self-pay | Admitting: Internal Medicine

## 2023-03-22 VITALS — BP 134/88 | HR 83 | Temp 98.7°F | Resp 12 | Ht 64.0 in | Wt 141.0 lb

## 2023-03-22 DIAGNOSIS — Z85038 Personal history of other malignant neoplasm of large intestine: Secondary | ICD-10-CM

## 2023-03-22 DIAGNOSIS — Z08 Encounter for follow-up examination after completed treatment for malignant neoplasm: Secondary | ICD-10-CM

## 2023-03-22 DIAGNOSIS — R933 Abnormal findings on diagnostic imaging of other parts of digestive tract: Secondary | ICD-10-CM

## 2023-03-22 DIAGNOSIS — K633 Ulcer of intestine: Secondary | ICD-10-CM | POA: Diagnosis not present

## 2023-03-22 MED ORDER — SODIUM CHLORIDE 0.9 % IV SOLN: 500.0000 mL | Freq: Once | INTRAVENOUS | Status: DC

## 2023-03-22 NOTE — Progress Notes (Signed)
GASTROENTEROLOGY PROCEDURE H&P NOTE   Primary Care Physician: Bradd Canary, MD    Reason for Procedure:  Abnormal surveillance CT scan in the setting of personal history of colon cancer  Plan:    Colonoscopy  Patient is appropriate for endoscopic procedure(s) in the ambulatory ([facility]) setting.  The nature of the procedure, as well as the risks, benefits, and alternatives were carefully and thoroughly reviewed with the patient. Ample time for discussion and questions allowed. The patient understood, was satisfied, and agreed to proceed.     HPI: Joan Owen is a 67 y.o. female who presents for colonoscopy.  Medical history as below.  Tolerated the prep.  No recent chest pain or shortness of breath.  No abdominal pain today.  Past Medical History:  Diagnosis Date   Anemia    BCC (basal cell carcinoma), face 01/05/2015   Lip   Colon cancer (HCC) 04/29/2021   Colon polyps    hyperplastic   Endometriosis    Gallstones 1981   History of chicken pox    Osteopenia 01/05/2015   Osteoporosis    PONV (postoperative nausea and vomiting)    Right shoulder pain 01/05/2015    Past Surgical History:  Procedure Laterality Date   BASAL CELL CARCINOMA EXCISION     upper lip   CHOLECYSTECTOMY  08/14/1978   COLECTOMY  04/29/2021   COLONOSCOPY     IR IMAGING GUIDED PORT INSERTION  07/31/2022   LAPAROSCOPY  08/15/1983   LIVER RESECTION      Prior to Admission medications   Not on File    No current outpatient medications on file.   Current Facility-Administered Medications  Medication Dose Route Frequency Provider Last Rate Last Admin   0.9 %  sodium chloride infusion  500 mL Intravenous Once , Carie Caddy, MD        Allergies as of 03/22/2023   (No Known Allergies)    Family History  Problem Relation Age of Onset   Dementia Mother        Alzheimer   Alzheimer's disease Mother    Heart disease Father    Cancer Sister        skin   Dementia Sister    Alzheimer's  disease Sister    Cancer Brother        skin   Thyroid nodules Daughter    Colon cancer Neg Hx    Esophageal cancer Neg Hx    Rectal cancer Neg Hx    Stomach cancer Neg Hx    Colon polyps Neg Hx     Social History   Socioeconomic History   Marital status: Married    Spouse name: Not on file   Number of children: 3   Years of education: Not on file   Highest education level: Not on file  Occupational History   Occupation: Network engineer  Tobacco Use   Smoking status: Never   Smokeless tobacco: Never  Vaping Use   Vaping status: Never Used  Substance and Sexual Activity   Alcohol use: Not Currently    Alcohol/week: 2.0 standard drinks of alcohol    Types: 2 Glasses of wine per week   Drug use: No   Sexual activity: Not on file  Other Topics Concern   Not on file  Social History Narrative   Not on file   Social Determinants of Health   Financial Resource Strain: Low Risk  (11/15/2022)   Received from [facility]   Overall Financial Resource Strain (CARDIA)    Difficulty of Paying Living Expenses: Not hard at all  Food Insecurity: No Food Insecurity (11/20/2022)   Hunger Vital Sign    Worried About Running Out of Food in the Last Year: Never true    Ran Out of Food in the Last Year: Never true  Transportation Needs: No Transportation Needs (11/20/2022)   PRAPARE - Administrator, Civil Service (Medical): No    Lack of Transportation (Non-Medical): No  Physical Activity: Not on file  Stress: Not on file  Social Connections: Not on file  Intimate Partner Violence: Not At Risk (08/08/2022)   Humiliation, Afraid, Rape, and Kick questionnaire    Fear of Current or Ex-Partner: No    Emotionally Abused: No    Physically Abused: No    Sexually Abused: No    Physical Exam: Vital signs in last 24 hours: @BP  123/73   Pulse 67   Temp 98.7 F (37.1 C)   Ht 5\' 4"  (1.626 m)   Wt 141 lb (64 kg)   LMP 03/28/2013    SpO2 98%   BMI 24.20 kg/m  GEN: NAD EYE: Sclerae anicteric ENT: MMM CV: Non-tachycardic Pulm: CTA b/l GI: Soft, NT/ND NEURO:  Alert & Oriented x 3   Erick Blinks, MD [facility]  03/22/2023 11:43 AM

## 2023-03-22 NOTE — Patient Instructions (Signed)

## 2023-03-22 NOTE — Progress Notes (Signed)
Called to room to assist during endoscopic procedure.  Patient ID and intended procedure confirmed with present staff. Received instructions for my participation in the procedure from the performing physician.  

## 2023-03-22 NOTE — Op Note (Signed)
[facility] Patient Name: Joan Owen Procedure Date: 03/22/2023 11:45 AM MRN: [id number] Endoscopist: Beverley Fiedler , MD, [id number] Age: 67 Referring MD:  Date of Birth: 02/17/1956 Gender: Female Account #: [id number] Procedure:                Colonoscopy Indications:              Personal history of malignant neoplasm of the                            sigmoid colon s/p resection, Abnormal CT of the                            colon with concern for recurrence, sigmoid cancer                            dx at colonoscopy Aug 2202; repeat colonoscopy Sept                            2023 (2 adenomas, healthy appearing anastomosis);                            flexible sigmoidoscopy Feb 2024 (healthy appearing                            anastomosis and diverticulosis) Medicines:                Monitored Anesthesia Care Procedure:                Pre-Anesthesia Assessment:                           - Prior to the procedure, a History and Physical                            was performed, and patient medications and                            allergies were reviewed. The patient's tolerance of                            previous anesthesia was also reviewed. The risks                            and benefits of the procedure and the sedation                            options and risks were discussed with the patient.                            All questions were answered, and informed consent                            was obtained. Prior Anticoagulants: The patient has  taken no anticoagulant or antiplatelet agents. ASA                            Grade Assessment: II - A patient with mild systemic                            disease. After reviewing the risks and benefits,                            the patient was deemed in satisfactory condition to                            undergo the procedure.                           After obtaining informed consent,  the colonoscope                            was passed under direct vision. Throughout the                            procedure, the patient's blood pressure, pulse, and                            oxygen saturations were monitored continuously. The                            Olympus PCF-H190DL (#[id number]) Colonoscope was                            introduced through the anus and advanced to the                            cecum, identified by the appendiceal orifice,                            ileocecal valve and palpation. The colonoscopy was                            performed without difficulty. The patient tolerated                            the procedure well. The quality of the bowel                            preparation was good. The ileocecal valve,                            appendiceal orifice, and rectum were photographed. Scope In: 11:51:54 AM Scope Out: 12:07:44 PM Scope Withdrawal Time: 0 hours 11 minutes 47 seconds  Total Procedure Duration: 0 hours 15 minutes 50 seconds  Findings:                 The digital rectal exam was normal.  There was evidence of a prior end-to-end                            colo-colonic anastomosis in the distal sigmoid                            colon. This was patent and was characterized by                            healthy appearing mucosa with a diverticula                            present. The anastomosis was traversed.                           A localized area of nodular and edematous mucosa                            was found in the mid-sigmoid colon approximately 5                            cm (2 folds) above the anastomosis. Biopsies were                            taken with a cold forceps for histology and to                            exclude local recurrence of colon cancer.                           Multiple medium-mouthed and small-mouthed                            diverticula were found in the  sigmoid colon and                            descending colon.                           The exam was otherwise without abnormality on                            direct and retroflexion views. Complications:            No immediate complications. Estimated Blood Loss:     Estimated blood loss was minimal. Impression:               - Patent end-to-end colo-colonic anastomosis,                            characterized by healthy appearing mucosa.                           - Nodular and edematous mucosa in the sigmoid colon  approximately 5 cm proximal to the distal sigmoid                            anastomosis. Biopsied extensive to exclude local                            recurrence of colon cancer.                           - Mild diverticulosis in the sigmoid colon and in                            the descending colon.                           - The examination was otherwise normal on direct                            and retroflexion views. Recommendation:           - Patient has a contact number available for                            emergencies. The signs and symptoms of potential                            delayed complications were discussed with the                            patient. Return to normal activities tomorrow.                            Written discharge instructions were provided to the                            patient.                           - Resume previous diet.                           - Continue present medications.                           - Await pathology results.                           - Repeat colonoscopy is recommended for                            surveillance. The colonoscopy date will be                            determined after pathology results from today's                            exam become available  for review. Beverley Fiedler, MD 03/22/2023 12:18:58 PM This report has been signed electronically.

## 2023-03-22 NOTE — Progress Notes (Signed)
Report to PACU, RN, vss, BBS= Clear.  

## 2023-03-23 ENCOUNTER — Encounter: Payer: Medicare Other | Admitting: Internal Medicine

## 2023-03-23 ENCOUNTER — Telehealth: Payer: Self-pay | Admitting: *Deleted

## 2023-03-23 NOTE — Telephone Encounter (Signed)
  Follow up Call-     03/22/2023   11:26 AM 10/03/2022    2:43 PM 04/27/2022    8:22 AM 03/24/2021   12:45 PM  Call back number  Post procedure Call Back phone  # [phone]  Permission to leave phone message Yes Yes Yes Yes     Patient questions:  Do you have a fever, pain , or abdominal swelling? No. Pain Score  0 *  Have you tolerated food without any problems? Yes.    Have you been able to return to your normal activities? Yes.    Do you have any questions about your discharge instructions: Diet   No. Medications  No. Follow up visit  No.  Do you have questions or concerns about your Care? No.  Actions: * If pain score is 4 or above: No action needed, pain <4.

## 2023-03-29 ENCOUNTER — Other Ambulatory Visit: Payer: Self-pay

## 2023-03-29 ENCOUNTER — Telehealth: Payer: Self-pay | Admitting: Internal Medicine

## 2023-03-29 DIAGNOSIS — K5732 Diverticulitis of large intestine without perforation or abscess without bleeding: Secondary | ICD-10-CM

## 2023-03-29 DIAGNOSIS — Z85038 Personal history of other malignant neoplasm of large intestine: Secondary | ICD-10-CM

## 2023-03-29 DIAGNOSIS — R933 Abnormal findings on diagnostic imaging of other parts of digestive tract: Secondary | ICD-10-CM

## 2023-03-29 MED ORDER — METRONIDAZOLE 250 MG PO TABS: 250.0000 mg | ORAL_TABLET | Freq: Three times a day (TID) | ORAL | 0 refills | Status: AC

## 2023-03-29 MED ORDER — CIPROFLOXACIN HCL 500 MG PO TABS: 500.0000 mg | ORAL_TABLET | Freq: Two times a day (BID) | ORAL | 0 refills | Status: AC

## 2023-03-29 NOTE — Telephone Encounter (Signed)
Pt scheduled for flex sig in the [facility] 05/02/23 at 10am. Pt aware of appt. Pt was asking about the prescription for the antibiotic. Is Dr. Modesta Messing sending that in?

## 2023-03-29 NOTE — Telephone Encounter (Signed)
Dr. Rhea Belton has already sent in prescriptions for cipro and flagyl. Pt aware.

## 2023-03-29 NOTE — Telephone Encounter (Signed)
I spoke with Dr. Revonda Standard with [facility] yesterday regarding recent CT and colonoscopy findings  Inflammatory mass/nodularity above the colocolonic anastomosis; extensive biopsies show inflammation only without malignancy Still some concern for malignancy given her history  After discussions including with the patient today I recommend that we treat with antibiotics empirically for diverticular inflammation/diverticulitis and repeat flexible sigmoidoscopy in 4 weeks. Flexible sigmoidoscopy can be performed in the [facility] Dr. Modesta Messing in agreement with this plan.  PET scan deferred because would likely be abnormal either way be a inflammatory or a malignancy.  Patient aware of plan.  Time provided for questions and answers and she thanked me for the call. She will see Dr. Truett Perna in 2 weeks

## 2023-04-10 ENCOUNTER — Inpatient Hospital Stay: Payer: Medicare Other

## 2023-04-10 ENCOUNTER — Inpatient Hospital Stay: Payer: Medicare Other | Admitting: Oncology

## 2023-04-10 ENCOUNTER — Inpatient Hospital Stay: Payer: Medicare Other | Attending: Oncology

## 2023-04-10 VITALS — BP 125/77 | HR 71 | Temp 98.1°F | Resp 18 | Ht 64.0 in | Wt 142.5 lb

## 2023-04-10 DIAGNOSIS — C187 Malignant neoplasm of sigmoid colon: Secondary | ICD-10-CM | POA: Diagnosis not present

## 2023-04-10 DIAGNOSIS — G620 Drug-induced polyneuropathy: Secondary | ICD-10-CM | POA: Diagnosis not present

## 2023-04-10 DIAGNOSIS — T451X5D Adverse effect of antineoplastic and immunosuppressive drugs, subsequent encounter: Secondary | ICD-10-CM | POA: Insufficient documentation

## 2023-04-10 DIAGNOSIS — Z95828 Presence of other vascular implants and grafts: Secondary | ICD-10-CM

## 2023-04-10 DIAGNOSIS — Z452 Encounter for adjustment and management of vascular access device: Secondary | ICD-10-CM | POA: Diagnosis not present

## 2023-04-10 DIAGNOSIS — Z85038 Personal history of other malignant neoplasm of large intestine: Secondary | ICD-10-CM

## 2023-04-10 LAB — CBC WITH DIFFERENTIAL (CANCER CENTER ONLY)
Abs Immature Granulocytes: 0.01 10*3/uL (ref 0.00–0.07)
Basophils Absolute: 0 10*3/uL (ref 0.0–0.1)
Basophils Relative: 1 %
Eosinophils Absolute: 0.2 10*3/uL (ref 0.0–0.5)
Eosinophils Relative: 5 %
HCT: 35.5 % — ABNORMAL LOW (ref 36.0–46.0)
Hemoglobin: 11.8 g/dL — ABNORMAL LOW (ref 12.0–15.0)
Immature Granulocytes: 0 %
Lymphocytes Relative: 27 %
Lymphs Abs: 1.2 10*3/uL (ref 0.7–4.0)
MCH: 32.2 pg (ref 26.0–34.0)
MCHC: 33.2 g/dL (ref 30.0–36.0)
MCV: 96.7 fL (ref 80.0–100.0)
Monocytes Absolute: 0.5 10*3/uL (ref 0.1–1.0)
Monocytes Relative: 12 %
Neutro Abs: 2.5 10*3/uL (ref 1.7–7.7)
Neutrophils Relative %: 55 %
Platelet Count: 170 10*3/uL (ref 150–400)
RBC: 3.67 MIL/uL — ABNORMAL LOW (ref 3.87–5.11)
RDW: 14.9 % (ref 11.5–15.5)
WBC Count: 4.4 10*3/uL (ref 4.0–10.5)
nRBC: 0 % (ref 0.0–0.2)

## 2023-04-10 MED ORDER — SODIUM CHLORIDE 0.9% FLUSH
10.0000 mL | Freq: Once | INTRAVENOUS | Status: AC
  Administered 2023-04-10: 10 mL via INTRAVENOUS

## 2023-04-10 MED ORDER — HEPARIN SOD (PORK) LOCK FLUSH 100 UNIT/ML IV SOLN
500.0000 [IU] | Freq: Once | INTRAVENOUS | Status: AC
  Administered 2023-04-10: 500 [IU] via INTRAVENOUS

## 2023-04-10 NOTE — Progress Notes (Signed)
[facility] OFFICE PROGRESS NOTE   Diagnosis: Colon cancer  INTERVAL HISTORY:   Joan Owen returns as scheduled.  She feels well.  She has minimal remaining neuropathy symptoms in the hands. She underwent restaging CTs at [facility] on 03/19/2023.  There was new wall thickening proximal to the sigmoid colon anastomosis. She was referred to Dr. Rhea Belton for a colonoscopy 03/22/2023.  Localized area of nodular and edematous mucosa was found in the mid sigmoid colon to folds above the anastomosis.  Biopsies were obtained.  Diverticula were found in the sigmoid and descending colon.  She completed a course of antibiotics.  Objective:  Vital signs in last 24 hours:  Blood pressure 125/77, pulse 71, temperature 98.1 F (36.7 C), temperature source Oral, resp. rate 18, height 5\' 4"  (1.626 m), weight 142 lb 8 oz (64.6 kg), last menstrual period 03/28/2013, SpO2 100%.   Lymphatics: No cervical, supraclavicular, axillary, or inguinal nodes Resp: Lungs clear bilaterally Cardio: Regular rate and rhythm GI: No hepatosplenomegaly, nontender, no mass Vascular: No leg edema   Portacath/PICC-without erythema  Lab Results:  Lab Results  Component Value Date   WBC 4.4 04/10/2023   HGB 11.8 (L) 04/10/2023   HCT 35.5 (L) 04/10/2023   MCV 96.7 04/10/2023   PLT 170 04/10/2023   NEUTROABS 2.5 04/10/2023    CMP  Lab Results  Component Value Date   NA 140 03/12/2023   K 4.3 03/12/2023   CL 101 03/12/2023   CO2 31 03/12/2023   GLUCOSE 97 03/12/2023   BUN 19 03/12/2023   CREATININE 0.76 03/12/2023   CALCIUM 9.4 03/12/2023   PROT 6.9 03/12/2023   ALBUMIN 4.2 03/12/2023   AST 20 03/12/2023   ALT 18 03/12/2023   ALKPHOS 95 03/12/2023   BILITOT 0.9 03/12/2023   GFRNONAA >60 03/07/2023    Lab Results  Component Value Date   CEA 1.27 02/21/2023    Medications: I have reviewed the patient's current medications.   Assessment/Plan: Colon cancer, stage IIa (T3 N0 M0), sigmoidectomy  04/29/2021 Colonoscopy 03/24/2021-partially obstructing mass in the sigmoid colon, biopsy-adenocarcinoma CTs 03/28/2021-sigmoid colon mass, prominent presacral and sigmoid mesentery lymph nodes-nonspecific, no evidence of distant metastatic disease, sclerotic lesions at the left intertrochanteric region-likely a bone island Sigmoidectomy 04/29/2021-sigmoid colon tumor, 0/16 lymph nodes, no lymphovascular or perineural invasion, no loss of mismatch repair protein expression, MSS Foundation 1-MSS, tumor mutation burden 6, K-ras and NRAS wild-type, ERBB2 amplification-equivocal HER2 2+, negative by FISH Colonoscopy 04/27/2022-polyps removed from the ascending and transverse colon-tubular adenomas 05/31/2022 CEA 8.54 06/28/2022 CEA 9.48 07/10/2022 CTs-new lesion central left hepatic lobe 07/16/2022 MRI 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 Seen by Dr. Modesta Messing with recommendation for a 37-month course of chemotherapy prior to surgery Cycle 1 FOLFOX 08/09/2022 Cycle 2 FOLFOX 08/23/2022 Cycle 3 FOLFOX 09/06/2022 Cycle 4 FOLFOX 09/20/2022 Cycle 5 FOLFOX 10/04/2022 Cycle 6 FOLFOX 10/18/2022 Segment 4A/8 partial hepatectomy 11/14/2022-moderately differentiated adenocarcinoma compatible with a colorectal metastasis, 70% tumor necrosis, resection margin positive, R1 vascular margin per Dr. Modesta Messing, tumor dissected off of the vessel treated with intraoperative argon Cycle 7 FOLFOX 12/20/2022 Cycle 8 FOLFOX 01/03/2023 Cycle 9 FOLFOX 01/24/2023-Emend added Cycle 10 FOLFOX 02/08/2023, oxaliplatin dose reduced due to mild neuropathy symptoms Cycle 11 FOLFOX 02/21/2023, oxaliplatin held due to increased neuropathy symptoms and thrombocytopenia, 5-FU bolus held due to thrombocytopenia Cycle 12 FOLFOX 03/07/2023, oxaliplatin held due to neuropathy CTs 03/19/2023-status post right hepatectomy, no new liver mass, asymmetric wall thickening proximal to the sigmoid  anastomosis, new from 10/30/2022 Colonoscopy  03/22/2023-nodular and edematous mucosa in the sigmoid colon 5 cm proximal to the anastomosis, biopsy-ulcerated colonic mucosa with reactive epithelial changes, negative for malignancy, completed a course of antibiotics   2.   Rectal bleeding, appointment with gastroenterology 09/25/2022-anal fissure per GI exam 09/11/2022.  Completed treatment for an anal fissure.  Flexible sigmoidoscopy 10/03/2022-moderate diverticulosis in the sigmoid colon and in the descending colon; patent end-to-side colocolonic anastomosis; anal papilla hypertrophied.  Rectal bleeding felt to be due to anal fissure.    Disposition: Joan Owen completed a final cycle of adjuvant chemotherapy on 03/07/2023.  She has mild oxaliplatin neuropathy.  Restaging CTs on 03/22/2023 revealed new wall thickening at the sigmoid colon.  A colonoscopy revealed nodular and edematous mucosa proximal to the sigmoid anastomosis.  A biopsy was negative for malignancy.  She is scheduled for a follow-up sigmoidoscopy on 05/02/2023. She will return for an office visit and Port-A-Cath flush in approximately 6 weeks.  She will be scheduled for surveillance imaging at [facility] at a 67-month interval. Thornton Papas, MD  04/10/2023  12:19 PM

## 2023-04-10 NOTE — Patient Instructions (Signed)

## 2023-05-02 ENCOUNTER — Ambulatory Visit: Payer: Medicare Other | Admitting: Internal Medicine

## 2023-05-02 ENCOUNTER — Encounter: Payer: Self-pay | Admitting: Internal Medicine

## 2023-05-02 VITALS — BP 123/69 | HR 69 | Temp 97.1°F | Resp 15 | Ht 64.0 in | Wt 142.0 lb

## 2023-05-02 DIAGNOSIS — Z08 Encounter for follow-up examination after completed treatment for malignant neoplasm: Secondary | ICD-10-CM

## 2023-05-02 DIAGNOSIS — D124 Benign neoplasm of descending colon: Secondary | ICD-10-CM | POA: Diagnosis not present

## 2023-05-02 DIAGNOSIS — K635 Polyp of colon: Secondary | ICD-10-CM | POA: Diagnosis not present

## 2023-05-02 DIAGNOSIS — K639 Disease of intestine, unspecified: Secondary | ICD-10-CM

## 2023-05-02 DIAGNOSIS — Z85038 Personal history of other malignant neoplasm of large intestine: Secondary | ICD-10-CM | POA: Diagnosis not present

## 2023-05-02 MED ORDER — SODIUM CHLORIDE 0.9 % IV SOLN: 500.0000 mL | Freq: Once | INTRAVENOUS | Status: DC

## 2023-05-02 NOTE — Progress Notes (Signed)
Vital-CW  Medical history reviewed.

## 2023-05-02 NOTE — Progress Notes (Signed)
Uneventful anesthetic. Report to pacu rn. Vss. Care resumed by rn. 

## 2023-05-02 NOTE — Progress Notes (Signed)
GASTROENTEROLOGY PROCEDURE H&P NOTE   Primary Care Physician: Bradd Canary, MD    Reason for Procedure:   personal history of colon cancer, abnormal CT  Plan:    Flex sig  Patient is appropriate for endoscopic procedure(s) in the ambulatory ([facility]) setting.  The nature of the procedure, as well as the risks, benefits, and alternatives were carefully and thoroughly reviewed with the patient. Ample time for discussion and questions allowed. The patient understood, was satisfied, and agreed to proceed.     HPI: Joan Owen is a 67 y.o. female who presents for flex sig.  Medical history as below.  Tolerated the prep.  No recent chest pain or shortness of breath.  No abdominal pain today.  Past Medical History:  Diagnosis Date   Anemia    BCC (basal cell carcinoma), face 01/05/2015   Lip   Colon cancer (HCC) 04/29/2021   Colon polyps    hyperplastic   Endometriosis    Gallstones 1981   History of chicken pox    Osteopenia 01/05/2015   Osteoporosis    PONV (postoperative nausea and vomiting)    Right shoulder pain 01/05/2015    Past Surgical History:  Procedure Laterality Date   BASAL CELL CARCINOMA EXCISION     upper lip   CHOLECYSTECTOMY  08/14/1978   COLECTOMY  04/29/2021   COLONOSCOPY     IR IMAGING GUIDED PORT INSERTION  07/31/2022   LAPAROSCOPY  08/15/1983   LIVER RESECTION      Prior to Admission medications   Not on File    No current outpatient medications on file.   Current Facility-Administered Medications  Medication Dose Route Frequency Provider Last Rate Last Admin   0.9 %  sodium chloride infusion  500 mL Intravenous Once Zaidyn Claire, Carie Caddy, MD        Allergies as of 05/02/2023   (No Known Allergies)    Family History  Problem Relation Age of Onset   Dementia Mother        Alzheimer   Alzheimer's disease Mother    Heart disease Father    Cancer Sister        skin   Dementia Sister    Alzheimer's disease Sister    Cancer Brother         skin   Thyroid nodules Daughter    Colon cancer Neg Hx    Esophageal cancer Neg Hx    Rectal cancer Neg Hx    Stomach cancer Neg Hx    Colon polyps Neg Hx     Social History   Socioeconomic History   Marital status: Married    Spouse name: Not on file   Number of children: 3   Years of education: Not on file   Highest education level: Not on file  Occupational History   Occupation: Network engineer  Tobacco Use   Smoking status: Never   Smokeless tobacco: Never  Vaping Use   Vaping status: Never Used  Substance and Sexual Activity   Alcohol use: Not Currently    Alcohol/week: 2.0 standard drinks of alcohol    Types: 2 Glasses of wine per week   Drug use: No   Sexual activity: Not on file  Other Topics Concern   Not on file  Social History Narrative   Not on file   Social Determinants of Health   Financial Resource Strain: Low Risk  (11/15/2022)   Received from [facility], [facility]  Overall Financial Resource Strain (CARDIA)    Difficulty of Paying Living Expenses: Not hard at all  Food Insecurity: No Food Insecurity (11/20/2022)   Hunger Vital Sign    Worried About Running Out of Food in the Last Year: Never true    Ran Out of Food in the Last Year: Never true  Transportation Needs: No Transportation Needs (11/20/2022)   PRAPARE - Administrator, Civil Service (Medical): No    Lack of Transportation (Non-Medical): No  Physical Activity: Not on file  Stress: Not on file  Social Connections: Not on file  Intimate Partner Violence: Not At Risk (08/08/2022)   Humiliation, Afraid, Rape, and Kick questionnaire    Fear of Current or Ex-Partner: No    Emotionally Abused: No    Physically Abused: No    Sexually Abused: No    Physical Exam: Vital signs in last 24 hours: @BP  130/77 (BP Location: Right Arm, Patient Position: Sitting, Cuff Size: Normal)   Pulse 69   Temp (!) 97.1 F (36.2 C) (Temporal)   Ht 5\' 4"   (1.626 m)   Wt 142 lb (64.4 kg)   LMP 03/28/2013   SpO2 98%   BMI 24.37 kg/m  GEN: NAD EYE: Sclerae anicteric ENT: MMM CV: Non-tachycardic Pulm: CTA b/l GI: Soft, NT/ND NEURO:  Alert & Oriented x 3   Erick Blinks, MD [facility]  05/02/2023 10:07 AM

## 2023-05-02 NOTE — Patient Instructions (Signed)
Please read handouts provided. Continue present medications. Await pathology results.   YOU HAD AN ENDOSCOPIC PROCEDURE TODAY AT THE [facility]:   Refer to the procedure report that was given to you for any specific questions about what was found during the examination.  If the procedure report does not answer your questions, please call your gastroenterologist to clarify.  If you requested that your care partner not be given the details of your procedure findings, then the procedure report has been included in a sealed envelope for you to review at your convenience later.  YOU SHOULD EXPECT: Some feelings of bloating in the abdomen. Passage of more gas than usual.  Walking can help get rid of the air that was put into your GI tract during the procedure and reduce the bloating. If you had a lower endoscopy (such as a colonoscopy or flexible sigmoidoscopy) you may notice spotting of blood in your stool or on the toilet paper. If you underwent a bowel prep for your procedure, you may not have a normal bowel movement for a few days.  Please Note:  You might notice some irritation and congestion in your nose or some drainage.  This is from the oxygen used during your procedure.  There is no need for concern and it should clear up in a day or so.  SYMPTOMS TO REPORT IMMEDIATELY:  Following lower endoscopy (colonoscopy or flexible sigmoidoscopy):  Excessive amounts of blood in the stool  Significant tenderness or worsening of abdominal pains  Swelling of the abdomen that is new, acute  Fever of 100F or higher  For urgent or emergent issues, a gastroenterologist can be reached at any hour by calling [phone]. Do not use MyChart messaging for urgent concerns.    DIET:  We do recommend a small meal at first, but then you may proceed to your regular diet.  Drink plenty of fluids but you should avoid alcoholic beverages for 24 hours.  ACTIVITY:  You should plan to take it easy for  the rest of today and you should NOT DRIVE or use heavy machinery until tomorrow (because of the sedation medicines used during the test).    FOLLOW UP: Our staff will call the number listed on your records the next business day following your procedure.  We will call around 7:15- 8:00 am to check on you and address any questions or concerns that you may have regarding the information given to you following your procedure. If we do not reach you, we will leave a message.     If any biopsies were taken you will be contacted by phone or by letter within the next 1-3 weeks.  Please call us at [phone] if you have not heard about the biopsies in 3 weeks.    SIGNATURES/CONFIDENTIALITY: You and/or your care partner have signed paperwork which will be entered into your electronic medical record.  These signatures attest to the fact that that the information above on your After Visit Summary has been reviewed and is understood.  Full responsibility of the confidentiality of this discharge information lies with you and/or your care-partner. 

## 2023-05-02 NOTE — Op Note (Signed)
[facility] Patient Name: Joan Owen Procedure Date: 05/02/2023 10:12 AM MRN: [id number] Endoscopist: Beverley Fiedler , MD, [id number] Age: 67 Referring MD:  Date of Birth: [birth year] Gender: Female Account #: [id number] Procedure:                Flexible Sigmoidoscopy Indications:              Personal history of malignant neoplasm of the                            sigmoid status post resection, recently abnormal CT                            of the colon with concern for recurrence, flexible                            sigmoidoscopy 6 weeks ago revealing an                            inflammatory/nodular area 4 to 5 cm above the                            anastomosis (biopsies benign); patient treated with                            antibiotics and returns today for surveillance Medicines:                Monitored Anesthesia Care Procedure:                Pre-Anesthesia Assessment:                           - Prior to the procedure, a History and Physical                            was performed, and patient medications and                            allergies were reviewed. The patient's tolerance of                            previous anesthesia was also reviewed. The risks                            and benefits of the procedure and the sedation                            options and risks were discussed with the patient.                            All questions were answered, and informed consent                            was obtained. Prior Anticoagulants: The patient has  taken no anticoagulant or antiplatelet agents. ASA                            Grade Assessment: II - A patient with mild systemic                            disease. After reviewing the risks and benefits,                            the patient was deemed in satisfactory condition to                            undergo the procedure.                           After obtaining  informed consent, the scope was                            passed under direct vision. The Olympus PCF-[id number]                            (#[id number]) Colonoscope was introduced through the                            anus and advanced to the descending colon. The                            flexible sigmoidoscopy was accomplished without                            difficulty. The patient tolerated the procedure                            well. The quality of the bowel preparation was good. Scope In: Scope Out: Findings:                 The digital rectal exam was normal.                           A 2 mm polyp was found in the descending colon. The                            polyp was sessile. The polyp was removed with a                            cold snare. Resection and retrieval were complete.                           A localized area of mildly nodular mucosa was found                            in the mid sigmoid colon. This was previously seen  and biopsied. Much improved today and consistent                            with healed inflammation (likely diverticular).                            Biopsies were taken with a cold forceps for                            histology.                           Multiple medium-mouthed and small-mouthed                            diverticula were found in the sigmoid colon and                            distal descending colon.                           There was evidence of a prior end-to-side                            colo-colonic anastomosis in the distal sigmoid                            colon. This was patent and was characterized by                            healthy appearing mucosa.                           The retroflexed view of the distal rectum and anal                            verge was normal and showed no anal or rectal                            abnormalities. Complications:            No immediate  complications. Estimated Blood Loss:     Estimated blood loss was minimal. Impression:               - One 2 mm polyp in the descending colon, removed                            with a cold snare. Resected and retrieved.                           - Nodular mucosa in the mid sigmoid colon. Much                            improved and benign appearing. Biopsied.                           -  Mild diverticulosis in the sigmoid colon and in                            the distal descending colon.                           - Patent end-to-side colo-colonic anastomosis,                            characterized by healthy appearing mucosa. Recommendation:           - Patient has a contact number available for                            emergencies. The signs and symptoms of potential                            delayed complications were discussed with the                            patient. Return to normal activities tomorrow.                            Written discharge instructions were provided to the                            patient.                           - Continue present medications.                           - Await pathology results.                           - Repeat colonoscopy for surveillance based on                            pathology results. Beverley Fiedler, MD 05/02/2023 10:45:45 AM This report has been signed electronically.

## 2023-05-02 NOTE — Progress Notes (Signed)
Called to room to assist during endoscopic procedure.  Patient ID and intended procedure confirmed with present staff. Received instructions for my participation in the procedure from the performing physician.  

## 2023-05-03 ENCOUNTER — Telehealth: Payer: Self-pay

## 2023-05-03 NOTE — Telephone Encounter (Signed)
Left message on follow up call.

## 2023-05-04 LAB — SURGICAL PATHOLOGY

## 2023-05-07 ENCOUNTER — Encounter: Payer: Self-pay | Admitting: Internal Medicine

## 2023-05-08 ENCOUNTER — Other Ambulatory Visit: Payer: Medicare Other | Admitting: Internal Medicine

## 2023-05-22 ENCOUNTER — Inpatient Hospital Stay: Payer: Medicare Other | Admitting: Oncology

## 2023-05-22 ENCOUNTER — Inpatient Hospital Stay: Payer: Medicare Other

## 2023-05-22 ENCOUNTER — Inpatient Hospital Stay: Payer: Medicare Other | Attending: Oncology

## 2023-05-22 VITALS — BP 117/73 | HR 70 | Temp 98.1°F | Resp 18 | Ht 64.0 in | Wt 144.9 lb

## 2023-05-22 DIAGNOSIS — C187 Malignant neoplasm of sigmoid colon: Secondary | ICD-10-CM | POA: Insufficient documentation

## 2023-05-22 DIAGNOSIS — Z85038 Personal history of other malignant neoplasm of large intestine: Secondary | ICD-10-CM

## 2023-05-22 LAB — CBC WITH DIFFERENTIAL (CANCER CENTER ONLY)
Abs Immature Granulocytes: 0.01 10*3/uL (ref 0.00–0.07)
Basophils Absolute: 0 10*3/uL (ref 0.0–0.1)
Basophils Relative: 1 %
Eosinophils Absolute: 0.1 10*3/uL (ref 0.0–0.5)
Eosinophils Relative: 3 %
HCT: 37.1 % (ref 36.0–46.0)
Hemoglobin: 12.6 g/dL (ref 12.0–15.0)
Immature Granulocytes: 0 %
Lymphocytes Relative: 39 %
Lymphs Abs: 1.4 10*3/uL (ref 0.7–4.0)
MCH: 31.7 pg (ref 26.0–34.0)
MCHC: 34 g/dL (ref 30.0–36.0)
MCV: 93.2 fL (ref 80.0–100.0)
Monocytes Absolute: 0.4 10*3/uL (ref 0.1–1.0)
Monocytes Relative: 10 %
Neutro Abs: 1.7 10*3/uL (ref 1.7–7.7)
Neutrophils Relative %: 47 %
Platelet Count: 164 10*3/uL (ref 150–400)
RBC: 3.98 MIL/uL (ref 3.87–5.11)
RDW: 12.2 % (ref 11.5–15.5)
WBC Count: 3.6 10*3/uL — ABNORMAL LOW (ref 4.0–10.5)
nRBC: 0 % (ref 0.0–0.2)

## 2023-05-22 LAB — CEA (ACCESS): CEA (CHCC): 1.03 ng/mL (ref 0.00–5.00)

## 2023-05-22 MED ORDER — HEPARIN SOD (PORK) LOCK FLUSH 100 UNIT/ML IV SOLN: 500.0000 [IU] | Freq: Once | INTRAVENOUS | Status: DC

## 2023-05-22 NOTE — Progress Notes (Signed)
[facility] OFFICE PROGRESS NOTE   Diagnosis: Colon cancer  INTERVAL HISTORY:   [name] returns as scheduled.  She feels well.  She underwent a repeat sigmoidoscopy 05/02/2023.  The nodular mucosa in the sigmoid colon appeared improved.  A biopsy was negative for malignancy.  She reports mild cold sensitivity in the feet.  No other complaint.  Objective:  Vital signs in last 24 hours:  Blood pressure 117/73, pulse 70, temperature 98.1 F (36.7 C), temperature source Temporal, resp. rate 18, height 5\' 4"  (1.626 m), weight 144 lb 14.4 oz (65.7 kg), last menstrual period 03/28/2013, SpO2 100%.    Lymphatics: No cervical, supraclavicular, axillary, or inguinal nodes Resp: Lungs clear bilaterally Cardio: Regular rate and rhythm GI: No hepatosplenomegaly Vascular: No leg edema    Portacath/PICC-without erythema  Lab Results:  Lab Results  Component Value Date   WBC 3.6 (L) 05/22/2023   HGB 12.6 05/22/2023   HCT 37.1 05/22/2023   MCV 93.2 05/22/2023   PLT 164 05/22/2023   NEUTROABS 1.7 05/22/2023    CMP  Lab Results  Component Value Date   NA 140 03/12/2023   K 4.3 03/12/2023   CL 101 03/12/2023   CO2 31 03/12/2023   GLUCOSE 97 03/12/2023   BUN 19 03/12/2023   CREATININE 0.76 03/12/2023   CALCIUM 9.4 03/12/2023   PROT 6.9 03/12/2023   ALBUMIN 4.2 03/12/2023   AST 20 03/12/2023   ALT 18 03/12/2023   ALKPHOS 95 03/12/2023   BILITOT 0.9 03/12/2023   GFRNONAA >60 03/07/2023    Lab Results  Component Value Date   CEA 1.03 05/22/2023    No results found for: "INR", "LABPROT"  Imaging:  No results found.  Medications: I have reviewed the patient's current medications.   Assessment/Plan:  Colon cancer, stage IIa (T3 N0 M0), sigmoidectomy 04/29/2021 Colonoscopy 03/24/2021-partially obstructing mass in the sigmoid colon, biopsy-adenocarcinoma CTs 03/28/2021-sigmoid colon mass, prominent presacral and sigmoid mesentery lymph  nodes-nonspecific, no evidence of distant metastatic disease, sclerotic lesions at the left intertrochanteric region-likely a bone island Sigmoidectomy 04/29/2021-sigmoid colon tumor, 0/16 lymph nodes, no lymphovascular or perineural invasion, no loss of mismatch repair protein expression, MSS Foundation 1-MSS, tumor mutation burden 6, K-ras and NRAS wild-type, ERBB2 amplification-equivocal HER2 2+, negative by FISH Colonoscopy 04/27/2022-polyps removed from the ascending and transverse colon-tubular adenomas 05/31/2022 CEA 8.54 06/28/2022 CEA 9.48 07/10/2022 CTs-new lesion central left hepatic lobe 07/16/2022 MRI 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 Seen by Dr. Modesta Messing with recommendation for a 1-month course of chemotherapy prior to surgery Cycle 1 FOLFOX 08/09/2022 Cycle 2 FOLFOX 08/23/2022 Cycle 3 FOLFOX 09/06/2022 Cycle 4 FOLFOX 09/20/2022 Cycle 5 FOLFOX 10/04/2022 Cycle 6 FOLFOX 10/18/2022 Segment 4A/8 partial hepatectomy 11/14/2022-moderately differentiated adenocarcinoma compatible with a colorectal metastasis, 70% tumor necrosis, resection margin positive, R1 vascular margin per Dr. Modesta Messing, tumor dissected off of the vessel treated with intraoperative argon Cycle 7 FOLFOX 12/20/2022 Cycle 8 FOLFOX 01/03/2023 Cycle 9 FOLFOX 01/24/2023-Emend added Cycle 10 FOLFOX 02/08/2023, oxaliplatin dose reduced due to mild neuropathy symptoms Cycle 11 FOLFOX 02/21/2023, oxaliplatin held due to increased neuropathy symptoms and thrombocytopenia, 5-FU bolus held due to thrombocytopenia Cycle 12 FOLFOX 03/07/2023, oxaliplatin held due to neuropathy CTs 03/19/2023-status post right hepatectomy, no new liver mass, asymmetric wall thickening proximal to the sigmoid anastomosis, new from 10/30/2022 Colonoscopy 03/22/2023-nodular and edematous mucosa in the sigmoid colon 5 cm proximal to the anastomosis, biopsy-ulcerated colonic mucosa with reactive epithelial changes, negative for malignancy,  completed a  course of antibiotics 05/02/2023-sigmoidoscopy, localized area of mildly nodular mucosa in the mid sigmoid improved, polyp removed from the descending colon, descending colon tubular adenoma, sigmoid polypoid mucosa-hyperplastic polyp, negative for dysplasia   2.   Rectal bleeding, appointment with gastroenterology 09/25/2022-anal fissure per GI exam 09/11/2022.  Completed treatment for an anal fissure.  Flexible sigmoidoscopy 10/03/2022-moderate diverticulosis in the sigmoid colon and in the descending colon; patent end-to-side colocolonic anastomosis; anal papilla hypertrophied.  Rectal bleeding felt to be due to anal fissure.     Disposition: Joan Owen appears well.  She is in clinical remission from colon cancer.  The repeat sigmoidoscopy revealed improvement in the sigmoid nodular mucosa and the biopsy was negative for malignancy.  She will be scheduled for follow-up imaging at [facility].  A Port-A-Cath remains in place.  She will return for a Port-A-Cath flush in 6 weeks and an office visit in 12 weeks.  Thornton Papas, MD  05/22/2023  10:17 AM

## 2023-06-11 ENCOUNTER — Encounter: Payer: Self-pay | Admitting: Oncology

## 2023-06-11 DIAGNOSIS — K080 Exfoliation of teeth due to systemic causes: Secondary | ICD-10-CM | POA: Diagnosis not present

## 2023-07-02 ENCOUNTER — Other Ambulatory Visit: Payer: Self-pay

## 2023-07-02 DIAGNOSIS — Z85038 Personal history of other malignant neoplasm of large intestine: Secondary | ICD-10-CM

## 2023-07-02 DIAGNOSIS — Z9089 Acquired absence of other organs: Secondary | ICD-10-CM | POA: Diagnosis not present

## 2023-07-02 DIAGNOSIS — R911 Solitary pulmonary nodule: Secondary | ICD-10-CM | POA: Diagnosis not present

## 2023-07-02 DIAGNOSIS — C787 Secondary malignant neoplasm of liver and intrahepatic bile duct: Secondary | ICD-10-CM | POA: Diagnosis not present

## 2023-07-02 DIAGNOSIS — C189 Malignant neoplasm of colon, unspecified: Secondary | ICD-10-CM | POA: Diagnosis not present

## 2023-07-02 DIAGNOSIS — Z08 Encounter for follow-up examination after completed treatment for malignant neoplasm: Secondary | ICD-10-CM | POA: Diagnosis not present

## 2023-07-02 NOTE — Progress Notes (Signed)
Per DR. Sherrill placed orders BMP, CEA, CAP scan for 3 months. Scheduling message sent for follow up after scans.

## 2023-07-04 ENCOUNTER — Encounter: Payer: Self-pay | Admitting: Oncology

## 2023-07-04 NOTE — Telephone Encounter (Signed)
Telephone call  

## 2023-07-17 ENCOUNTER — Ambulatory Visit (INDEPENDENT_AMBULATORY_CARE_PROVIDER_SITE_OTHER): Payer: Medicare Other

## 2023-07-17 VITALS — Ht 64.0 in | Wt 144.0 lb

## 2023-07-17 DIAGNOSIS — Z0000 Encounter for general adult medical examination without abnormal findings: Secondary | ICD-10-CM

## 2023-07-17 NOTE — Patient Instructions (Addendum)
Joan Owen , Thank you for taking time to come for your Medicare Wellness Visit. I appreciate your ongoing commitment to your health goals. Please review the following plan we discussed and let me know if I can assist you in the future.   Referrals/Orders/Follow-Ups/Clinician Recommendations:   This is a list of the screening recommended for you and due dates:  Health Maintenance  Topic Date Due   Hepatitis C Screening  Never done   Pneumonia Vaccine (1 of 1 - PCV) Never done   Flu Shot  05/21/2024*   Mammogram  11/10/2023   Medicare Annual Wellness Visit  07/16/2024   DTaP/Tdap/Td vaccine (2 - Td or Tdap) 01/04/2025   Colon Cancer Screening  03/21/2025   DEXA scan (bone density measurement)  Completed   HPV Vaccine  Aged Out   COVID-19 Vaccine  Discontinued   Zoster (Shingles) Vaccine  Discontinued  *Topic was postponed. The date shown is not the original due date.    Advanced directives: (In Chart) A copy of your advanced directives are scanned into your chart should your provider ever need it.  Next Medicare Annual Wellness Visit scheduled for next year: Yes

## 2023-07-17 NOTE — Progress Notes (Signed)
Subjective:   Joan Owen is a 67 y.o. female who presents for Medicare Annual (Subsequent) preventive examination.  Visit Complete: Virtual I connected with  Joan Owen on 07/17/23 by a audio enabled telemedicine application and verified that I am speaking with the correct person using two identifiers.  Patient Location: Home  Provider Location: Home Office  I discussed the limitations of evaluation and management by telemedicine. The patient expressed understanding and agreed to proceed.  Vital Signs: Because this visit was a virtual/telehealth visit, some criteria may be missing or patient reported. Any vitals not documented were not able to be obtained and vitals that have been documented are patient reported.  Patient Medicare AWV questionnaire was completed by the patient on 07/10/23; I have confirmed that all information answered by patient is correct and no changes since this date.  Cardiac Risk Factors include: advanced age (>31men, >69 women)     Objective:    Today's Vitals   07/17/23 1017  Weight: 144 lb (65.3 kg)  Height: 5\' 4"  (1.626 m)   Body mass index is 24.72 kg/m.     07/17/2023   10:23 AM 05/22/2023    9:51 AM 04/10/2023   11:57 AM 03/07/2023    9:53 AM 02/21/2023    8:59 AM 02/08/2023    8:39 AM 01/24/2023    9:06 AM  Advanced Directives  Does Patient Have a Medical Advance Directive? Yes Yes Yes Yes Yes Yes Yes  Type of Estate agent of [place] [employer] [place];Living will Healthcare Power of [place];Living will Healthcare Power of [place];Living will Living will;Healthcare Power of Attorney Living will;Healthcare Power of Attorney Living will;Healthcare Power of [employer] Power of [place];Living will  Does patient want to make changes to medical advance directive? No - Patient declined No - Patient declined No - Patient declined No - Patient declined No - Patient declined No - Patient declined No - Patient declined  Copy of Healthcare  Power of Attorney in Chart? Yes - validated most recent copy scanned in chart (See row information) Yes - validated most recent copy scanned in chart (See row information) Yes - validated most recent copy scanned in chart (See row information)    Yes - validated most recent copy scanned in chart (See row information)  Would patient like information on creating a medical advance directive?    No - Patient declined No - Patient declined No - Patient declined     Current Medications (verified) No outpatient encounter medications on file as of 07/17/2023.   No facility-administered encounter medications on file as of 07/17/2023.    Allergies (verified) Patient has no known allergies.   History: Past Medical History:  Diagnosis Date   Anemia    BCC (basal cell carcinoma), face 01/05/2015   Lip   Colon cancer (HCC) 04/29/2021   Colon polyps    hyperplastic   Endometriosis    Gallstones 1981   History of chicken pox    Osteopenia 01/05/2015   Osteoporosis    PONV (postoperative nausea and vomiting)    Right shoulder pain 01/05/2015   Past Surgical History:  Procedure Laterality Date   BASAL CELL CARCINOMA EXCISION     upper lip   CHOLECYSTECTOMY  08/14/1978   COLECTOMY  04/29/2021   COLONOSCOPY     IR IMAGING GUIDED PORT INSERTION  07/31/2022   LAPAROSCOPY  08/15/1983   LIVER RESECTION     Family History  Problem Relation Age of Onset   Dementia Mother  Alzheimer   Alzheimer's disease Mother    Heart disease Father    Cancer Sister        skin   Dementia Sister    Alzheimer's disease Sister    Cancer Brother        skin   Thyroid nodules Daughter    Colon cancer Neg Hx    Esophageal cancer Neg Hx    Rectal cancer Neg Hx    Stomach cancer Neg Hx    Colon polyps Neg Hx    Social History   Socioeconomic History   Marital status: Married    Spouse name: Not on file   Number of children: 3   Years of education: Not on file   Highest education level: Not on  file  Occupational History   Occupation: Network engineer  Tobacco Use   Smoking status: Never   Smokeless tobacco: Never  Vaping Use   Vaping status: Never Used  Substance and Sexual Activity   Alcohol use: Not Currently    Alcohol/week: 2.0 standard drinks of alcohol    Types: 2 Glasses of wine per week   Drug use: No   Sexual activity: Not on file  Other Topics Concern   Not on file  Social History Narrative   Not on file   Social Determinants of Health   Financial Resource Strain: Low Risk  (07/10/2023)   Overall Financial Resource Strain (CARDIA)    Difficulty of Paying Living Expenses: Not hard at all  Food Insecurity: No Food Insecurity (07/10/2023)   Hunger Vital Sign    Worried About Running Out of Food in the Last Year: Never true    Ran Out of Food in the Last Year: Never true  Transportation Needs: No Transportation Needs (07/10/2023)   PRAPARE - Administrator, Civil Service (Medical): No    Lack of Transportation (Non-Medical): No  Physical Activity: Sufficiently Active (07/10/2023)   Exercise Vital Sign    Days of Exercise per Week: 5 days    Minutes of Exercise per Session: 50 min  Stress: No Stress Concern Present (07/10/2023)   [employer] of Occupational Health - Occupational Stress Questionnaire    Feeling of Stress : Not at all  Social Connections: Unknown (07/10/2023)   Social Connection and Isolation Panel [NHANES]    Frequency of Communication with Friends and Family: More than three times a week    Frequency of Social Gatherings with Friends and Family: Once a week    Attends Religious Services: Not on Marketing executive or Organizations: Yes    Attends Engineer, structural: More than 4 times per year    Marital Status: Married    Tobacco Counseling Counseling given: Not Answered   Clinical Intake:  Pre-visit preparation completed: Yes  Pain : No/denies pain     BMI - recorded:  24.72 Nutritional Status: BMI of 19-24  Normal Nutritional Risks: None Diabetes: No  How often do you need to have someone help you when you read instructions, pamphlets, or other written materials from your doctor or pharmacy?: 1 - Never  Interpreter Needed?: No  Information entered by :: Theresa Mulligan LPN   Activities of Daily Living    07/10/2023    1:00 PM  In your present state of health, do you have any difficulty performing the following activities:  Hearing? 0  Vision? 0  Difficulty concentrating or making decisions? 0  Walking or climbing stairs? 0  Dressing or bathing? 0  Doing errands, shopping? 0  Preparing Food and eating ? N  Using the Toilet? N  In the past six months, have you accidently leaked urine? N  Do you have problems with loss of bowel control? N  Managing your Medications? N  Managing your Finances? N  Housekeeping or managing your Housekeeping? N    Patient Care Team: Bradd Canary, MD as PCP - General (Family Medicine) Teodora Medici, MD as Consulting Physician (Gynecology) Hart Carwin, MD (Inactive) as Consulting Physician (Gastroenterology) Elesa Hacker, MD as Referring Physician (Dermatology) Ardell Isaacs, RN as Oncology Nurse Navigator  Indicate any recent Medical Services you may have received from other than [facility] providers in the past year (date may be approximate).     Assessment:   This is a routine wellness examination for [place].  Hearing/Vision screen Hearing Screening - Comments:: Denies hearing difficulties   Vision Screening - Comments:: Wears reading glasses - up to date with routine eye exams with [facility]    Goals Addressed               This Visit's Progress     Increase physical activity (pt-stated)         Depression Screen    07/17/2023   10:27 AM 03/12/2023    1:20 PM 11/07/2021    9:19 AM 11/02/2020    9:07 AM 01/05/2015   11:10 AM  PHQ 2/9 Scores  PHQ - 2 Score 0 0 0 0 0   PHQ- 9 Score    0     Fall Risk    07/17/2023   10:22 AM 07/10/2023    1:00 PM 03/12/2023    1:20 PM 11/07/2021    9:19 AM 01/05/2015   11:10 AM  Fall Risk   Falls in the past year? 0 0 0 1 No  Number falls in past yr: 0 0 0 1   Injury with Fall? 0 0 0 0   Risk for fall due to : No Fall Risks   History of fall(s)   Follow up Falls prevention discussed  Falls evaluation completed Falls evaluation completed;Education provided     MEDICARE RISK AT HOME: Medicare Risk at Home Any stairs in or around the home?: Yes If so, are there any without handrails?: Yes Home free of loose throw rugs in walkways, pet beds, electrical cords, etc?: Yes Adequate lighting in your home to reduce risk of falls?: Yes Life alert?: No Use of a cane, walker or w/c?: No Grab bars in the bathroom?: No Shower chair or bench in shower?: No Elevated toilet seat or a handicapped toilet?: No  TIMED UP AND GO:  Was the test performed?  No    Cognitive Function:        07/17/2023   10:23 AM  6CIT Screen  What Year? 0 points  What month? 0 points  What time? 0 points  Count back from 20 0 points  Months in reverse 0 points  Repeat phrase 0 points  Total Score 0 points    Immunizations Immunization History  Administered Date(s) Administered   Influenza,inj,Quad PF,6+ Mos 06/13/2021   Influenza-Unspecified 06/13/2021   PFIZER(Purple Top)SARS-COV-2 Vaccination 11/06/2019, 12/01/2019, 07/20/2020   Tdap 01/05/2015   Unspecified SARS-COV-2 Vaccination 06/13/2021    TDAP status: Up to date  Flu Vaccine status: Due, Education has been provided regarding the importance of this vaccine. Advised may receive this vaccine at local pharmacy or Health  Dept. Aware to provide a copy of the vaccination record if obtained from local pharmacy or Health Dept. Verbalized acceptance and understanding.  Pneumococcal vaccine status: Due, Education has been provided regarding the importance of this vaccine. Advised  may receive this vaccine at local pharmacy or Health Dept. Aware to provide a copy of the vaccination record if obtained from local pharmacy or Health Dept. Verbalized acceptance and understanding.      Screening Tests Health Maintenance  Topic Date Due   Hepatitis C Screening  Never done   Pneumonia Vaccine 82+ Years old (1 of 1 - PCV) Never done   INFLUENZA VACCINE  05/21/2024 (Originally 03/15/2023)   MAMMOGRAM  11/10/2023   Medicare Annual Wellness (AWV)  07/16/2024   DTaP/Tdap/Td (2 - Td or Tdap) 01/04/2025   Colonoscopy  03/21/2025   DEXA SCAN  Completed   HPV VACCINES  Aged Out   COVID-19 Vaccine  Discontinued   Zoster Vaccines- Shingrix  Discontinued    Health Maintenance  Health Maintenance Due  Topic Date Due   Hepatitis C Screening  Never done   Pneumonia Vaccine 38+ Years old (1 of 1 - PCV) Never done    Colorectal cancer screening: Type of screening: Colonoscopy. Completed 03/22/23. Repeat every 2 years  Mammogram status: Completed 11/09/21. Repeat every year  Bone Density status: Completed 07/17/19. Results reflect: Bone density results: OSTEOPOROSIS. Repeat every   years.    Additional Screening:  Hepatitis C Screening: does qualify;  Deferred  Vision Screening: Recommended annual ophthalmology exams for early detection of glaucoma and other disorders of the eye. Is the patient up to date with their annual eye exam?  Yes  Who is the provider or what is the name of the office in which the patient attends annual eye exams? [facility] If pt is not established with a provider, would they like to be referred to a provider to establish care? No .   Dental Screening: Recommended annual dental exams for proper oral hygiene   Community Resource Referral / Chronic Care Management:  CRR required this visit?  No   CCM required this visit?  No     Plan:     I have personally reviewed and noted the following in the patient's chart:   Medical and social  history Use of alcohol, tobacco or illicit drugs  Current medications and supplements including opioid prescriptions. Patient is not currently taking opioid prescriptions. Functional ability and status Nutritional status Physical activity Advanced directives List of other physicians Hospitalizations, surgeries, and ER visits in previous 12 months Vitals Screenings to include cognitive, depression, and falls Referrals and appointments  In addition, I have reviewed and discussed with patient certain preventive protocols, quality metrics, and best practice recommendations. A written personalized care plan for preventive services as well as general preventive health recommendations were provided to patient.     Tillie Rung, LPN   29/12/2839   After Visit Summary: (MyChart) Due to this being a telephonic visit, the after visit summary with patients personalized plan was offered to patient via MyChart   Nurse Notes: None

## 2023-07-18 ENCOUNTER — Other Ambulatory Visit: Payer: Self-pay

## 2023-08-13 ENCOUNTER — Other Ambulatory Visit (HOSPITAL_BASED_OUTPATIENT_CLINIC_OR_DEPARTMENT_OTHER): Payer: Self-pay

## 2023-08-13 MED ORDER — PREVNAR 20 0.5 ML IM SUSY
0.5000 mL | PREFILLED_SYRINGE | Freq: Once | INTRAMUSCULAR | 0 refills | Status: AC
  Filled 2023-08-13: qty 0.5, 1d supply, fill #0

## 2023-08-16 ENCOUNTER — Other Ambulatory Visit: Payer: Medicare Other

## 2023-08-16 ENCOUNTER — Ambulatory Visit: Payer: Medicare Other | Admitting: Oncology

## 2023-08-24 ENCOUNTER — Inpatient Hospital Stay: Payer: Medicare Other | Admitting: Oncology

## 2023-08-24 ENCOUNTER — Inpatient Hospital Stay: Payer: Medicare Other

## 2023-08-24 ENCOUNTER — Inpatient Hospital Stay: Payer: Medicare Other | Attending: Oncology

## 2023-08-24 VITALS — BP 124/67 | HR 82 | Temp 98.1°F | Resp 18 | Ht 64.0 in | Wt 149.3 lb

## 2023-08-24 DIAGNOSIS — C187 Malignant neoplasm of sigmoid colon: Secondary | ICD-10-CM | POA: Diagnosis not present

## 2023-08-24 DIAGNOSIS — Z23 Encounter for immunization: Secondary | ICD-10-CM | POA: Insufficient documentation

## 2023-08-24 LAB — CEA (ACCESS): CEA (CHCC): <1 ng/mL (ref 0.00–5.00)

## 2023-08-24 MED ORDER — INFLUENZA VAC A&B SURF ANT ADJ 0.5 ML IM SUSY
0.5000 mL | PREFILLED_SYRINGE | Freq: Once | INTRAMUSCULAR | Status: AC
  Administered 2023-08-24: 0.5 mL via INTRAMUSCULAR
  Filled 2023-08-24: qty 0.5

## 2023-08-24 NOTE — Progress Notes (Signed)
 Joan Owen OFFICE PROGRESS NOTE   Diagnosis: Colon cancer  INTERVAL HISTORY:  Joan Owen returns as scheduled.  She feels well.  No neuropathy symptoms.  No complaint.  Objective:  Vital signs in last 24 hours:  Blood pressure 124/67, pulse 82, temperature 98.1 F (36.7 C), temperature source Temporal, resp. rate 18, height 5' 4 (1.626 m), weight 149 lb 4.8 oz (67.7 kg), last menstrual period 03/28/2013, SpO2 100%.    Lymphatics: No cervical, supraclavicular, axillary, or inguinal nodes Resp: Lungs clear bilaterally Cardio: Regular rate and rhythm GI: No hepatosplenomegaly, no mass, nontender Vascular: No leg edema    Portacath/PICC-without erythema  Lab Results:  Lab Results  Component Value Date   WBC 3.6 (L) 05/22/2023   HGB 12.6 05/22/2023   HCT 37.1 05/22/2023   MCV 93.2 05/22/2023   PLT 164 05/22/2023   NEUTROABS 1.7 05/22/2023    CMP  Lab Results  Component Value Date   NA 140 03/12/2023   K 4.3 03/12/2023   CL 101 03/12/2023   CO2 31 03/12/2023   GLUCOSE 97 03/12/2023   BUN 19 03/12/2023   CREATININE 0.76 03/12/2023   CALCIUM  9.4 03/12/2023   PROT 6.9 03/12/2023   ALBUMIN 4.2 03/12/2023   AST 20 03/12/2023   ALT 18 03/12/2023   ALKPHOS 95 03/12/2023   BILITOT 0.9 03/12/2023   GFRNONAA >60 03/07/2023    Lab Results  Component Value Date   CEA <1.00 08/24/2023    Medications: I have reviewed the patient's current medications.   Assessment/Plan:  Colon cancer, stage IIa (T3 N0 M0), sigmoidectomy 04/29/2021 Colonoscopy 03/24/2021-partially obstructing mass in the sigmoid colon, biopsy-adenocarcinoma CTs 03/28/2021-sigmoid colon mass, prominent presacral and sigmoid mesentery lymph nodes-nonspecific, no evidence of distant metastatic disease, sclerotic lesions at the left intertrochanteric region-likely a bone island Sigmoidectomy 04/29/2021-sigmoid colon tumor, 0/16 lymph nodes, no lymphovascular or perineural invasion, no loss  of mismatch repair protein expression, MSS Foundation 1-MSS, tumor mutation burden 6, K-ras and NRAS wild-type, ERBB2 amplification-equivocal HER2 2+, negative by FISH Colonoscopy 04/27/2022-polyps removed from the ascending and transverse colon-tubular adenomas 05/31/2022 CEA 8.54 06/28/2022 CEA 9.48 07/10/2022 CTs-new lesion central left hepatic lobe 07/16/2022 MRI 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 Seen by Dr. Barbaraann with recommendation for a 31-month course of chemotherapy prior to surgery Cycle 1 FOLFOX 08/09/2022 Cycle 2 FOLFOX 08/23/2022 Cycle 3 FOLFOX 09/06/2022 Cycle 4 FOLFOX 09/20/2022 Cycle 5 FOLFOX 10/04/2022 Cycle 6 FOLFOX 10/18/2022 Segment 4A/8 partial hepatectomy 11/14/2022-moderately differentiated adenocarcinoma compatible with a colorectal metastasis, 70% tumor necrosis, resection margin positive, R1 vascular margin per Dr. Barbaraann, tumor dissected off of the vessel treated with intraoperative argon Cycle 7 FOLFOX 12/20/2022 Cycle 8 FOLFOX 01/03/2023 Cycle 9 FOLFOX 01/24/2023-Emend  added Cycle 10 FOLFOX 02/08/2023, oxaliplatin  dose reduced due to mild neuropathy symptoms Cycle 11 FOLFOX 02/21/2023, oxaliplatin  held due to increased neuropathy symptoms and thrombocytopenia, 5-FU bolus held due to thrombocytopenia Cycle 12 FOLFOX 03/07/2023, oxaliplatin  held due to neuropathy CTs 03/19/2023-status post right hepatectomy, no new liver mass, asymmetric wall thickening proximal to the sigmoid anastomosis, new from 10/30/2022 Colonoscopy 03/22/2023-nodular and edematous mucosa in the sigmoid colon 5 cm proximal to the anastomosis, biopsy-ulcerated colonic mucosa with reactive epithelial changes, negative for malignancy, completed a course of antibiotics 05/02/2023-sigmoidoscopy, localized area of mildly nodular mucosa in the mid sigmoid improved, polyp removed from the descending colon, descending colon tubular adenoma, sigmoid polypoid mucosa-hyperplastic polyp, negative  for dysplasia CTs at [facility] 07/03/2023: No evidence of metastatic disease,  new 4 mm right upper lobe nodule favored inflammatory   2.   Rectal bleeding, appointment with gastroenterology 09/25/2022-anal fissure per GI exam 09/11/2022.  Completed treatment for an anal fissure.  Flexible sigmoidoscopy 10/03/2022-moderate diverticulosis in the sigmoid colon and in the descending colon; patent end-to-side colocolonic anastomosis; anal papilla hypertrophied.  Rectal bleeding felt to be due to anal fissure.      Disposition: Joan Owen is in clinical remission from colon cancer.  She will undergo surveillance CTs prior to an office visit in 3 months.  She is scheduled for follow-up with Dr. Barbaraann in May.  Arley Hof, MD  08/24/2023  4:14 PM

## 2023-09-03 DIAGNOSIS — Z1283 Encounter for screening for malignant neoplasm of skin: Secondary | ICD-10-CM | POA: Diagnosis not present

## 2023-09-03 DIAGNOSIS — L570 Actinic keratosis: Secondary | ICD-10-CM | POA: Diagnosis not present

## 2023-09-03 DIAGNOSIS — X32XXXD Exposure to sunlight, subsequent encounter: Secondary | ICD-10-CM | POA: Diagnosis not present

## 2023-09-03 DIAGNOSIS — D225 Melanocytic nevi of trunk: Secondary | ICD-10-CM | POA: Diagnosis not present

## 2023-09-13 ENCOUNTER — Ambulatory Visit: Payer: Medicare Other | Admitting: Family Medicine

## 2023-09-20 ENCOUNTER — Encounter: Payer: Self-pay | Admitting: Oncology

## 2023-09-20 ENCOUNTER — Inpatient Hospital Stay: Payer: Medicare Other

## 2023-09-20 ENCOUNTER — Inpatient Hospital Stay: Payer: Medicare Other | Attending: Oncology

## 2023-09-20 ENCOUNTER — Ambulatory Visit (HOSPITAL_BASED_OUTPATIENT_CLINIC_OR_DEPARTMENT_OTHER)
Admission: RE | Admit: 2023-09-20 | Discharge: 2023-09-20 | Disposition: A | Discharge status: Home / Self Care | Payer: Medicare Other | Source: Ambulatory Visit | Attending: Oncology | Admitting: Oncology

## 2023-09-20 DIAGNOSIS — Z85038 Personal history of other malignant neoplasm of large intestine: Secondary | ICD-10-CM | POA: Insufficient documentation

## 2023-09-20 DIAGNOSIS — R911 Solitary pulmonary nodule: Secondary | ICD-10-CM | POA: Insufficient documentation

## 2023-09-20 DIAGNOSIS — C187 Malignant neoplasm of sigmoid colon: Secondary | ICD-10-CM | POA: Insufficient documentation

## 2023-09-20 DIAGNOSIS — K080 Exfoliation of teeth due to systemic causes: Secondary | ICD-10-CM | POA: Diagnosis not present

## 2023-09-20 DIAGNOSIS — K5730 Diverticulosis of large intestine without perforation or abscess without bleeding: Secondary | ICD-10-CM | POA: Diagnosis not present

## 2023-09-20 LAB — CEA (ACCESS): CEA (CHCC): <1 ng/mL (ref 0.00–5.00)

## 2023-09-20 LAB — BASIC METABOLIC PANEL - CANCER CENTER ONLY
Anion gap: 7 (ref 5–15)
BUN: 17 mg/dL (ref 8–23)
CO2: 29 mmol/L (ref 22–32)
Calcium: 9.2 mg/dL (ref 8.9–10.3)
Chloride: 105 mmol/L (ref 98–111)
Creatinine: 0.71 mg/dL (ref 0.44–1.00)
GFR, Estimated: >60 mL/min (ref >60)
Glucose, Bld: 109 mg/dL — ABNORMAL HIGH (ref 70–99)
Potassium: 3.8 mmol/L (ref 3.5–5.1)
Sodium: 141 mmol/L (ref 135–145)

## 2023-09-20 MED ORDER — IOHEXOL 300 MG/ML  SOLN
100.0000 mL | Freq: Once | INTRAMUSCULAR | Status: AC | PRN
  Administered 2023-09-20: 85 mL via INTRAVENOUS

## 2023-09-20 MED ORDER — HEPARIN SOD (PORK) LOCK FLUSH 100 UNIT/ML IV SOLN
500.0000 [IU] | Freq: Once | INTRAVENOUS | Status: AC
  Administered 2023-09-20: 500 [IU] via INTRAVENOUS

## 2023-09-20 NOTE — Patient Instructions (Signed)

## 2023-09-21 ENCOUNTER — Other Ambulatory Visit: Payer: Self-pay | Admitting: *Deleted

## 2023-09-21 ENCOUNTER — Inpatient Hospital Stay
Admission: RE | Admit: 2023-09-21 | Discharge: 2023-09-21 | Disposition: A | Discharge status: Home / Self Care | Payer: Self-pay | Source: Ambulatory Visit | Attending: Oncology | Admitting: Oncology

## 2023-09-21 DIAGNOSIS — C187 Malignant neoplasm of sigmoid colon: Secondary | ICD-10-CM

## 2023-09-26 ENCOUNTER — Telehealth: Payer: Self-pay | Admitting: *Deleted

## 2023-09-26 ENCOUNTER — Inpatient Hospital Stay: Payer: Medicare Other | Admitting: Oncology

## 2023-09-26 ENCOUNTER — Telehealth: Payer: Self-pay | Admitting: Oncology

## 2023-09-26 NOTE — Telephone Encounter (Signed)
LVM for patient to call to reschedule her missed appointment today to review her CT scan

## 2023-09-26 NOTE — Telephone Encounter (Signed)
Spoke w/patient and she aplogized--thought her appointment today was at 11:00. Is available to come on Friday if that is possible.

## 2023-09-26 NOTE — Telephone Encounter (Signed)
Spoke with patient confirming upcoming appointment

## 2023-09-27 DIAGNOSIS — H6123 Impacted cerumen, bilateral: Secondary | ICD-10-CM | POA: Diagnosis not present

## 2023-09-28 ENCOUNTER — Inpatient Hospital Stay (HOSPITAL_BASED_OUTPATIENT_CLINIC_OR_DEPARTMENT_OTHER): Payer: Medicare Other | Admitting: Nurse Practitioner

## 2023-09-28 ENCOUNTER — Encounter: Payer: Self-pay | Admitting: Nurse Practitioner

## 2023-09-28 VITALS — BP 117/70 | HR 61 | Temp 98.1°F | Resp 18 | Ht 64.0 in | Wt 153.7 lb

## 2023-09-28 DIAGNOSIS — R911 Solitary pulmonary nodule: Secondary | ICD-10-CM | POA: Diagnosis not present

## 2023-09-28 DIAGNOSIS — C187 Malignant neoplasm of sigmoid colon: Secondary | ICD-10-CM | POA: Diagnosis not present

## 2023-09-28 NOTE — Progress Notes (Signed)
[facility] OFFICE PROGRESS NOTE   Diagnosis: Colon cancer  INTERVAL HISTORY:   Joan Owen returns for follow-up.  She feels well.  Bowels moving regularly.  She has a good appetite.  No abdominal pain.  No nausea or vomiting.  No fever, cough, shortness of breath.  Objective:  Vital signs in last 24 hours:  Blood pressure 117/70, pulse 61, temperature 98.1 F (36.7 C), temperature source Temporal, resp. rate 18, height 5\' 4"  (1.626 m), weight 153 lb 11.2 oz (69.7 kg), last menstrual period 03/28/2013, SpO2 100%.    HEENT: No thrush or ulcers. Lymphatics: No palpable cervical, supraclavicular, axillary or inguinal lymph nodes. Resp: Lungs clear bilaterally. Cardio: Regular rate and rhythm. GI: Abdomen soft and nontender.  No hepatosplenomegaly. Vascular: No leg edema. Port-A-Cath without erythema.  Lab Results:  Lab Results  Component Value Date   WBC 3.6 (L) 05/22/2023   HGB 12.6 05/22/2023   HCT 37.1 05/22/2023   MCV 93.2 05/22/2023   PLT 164 05/22/2023   NEUTROABS 1.7 05/22/2023    Imaging:  No results found.  Medications: I have reviewed the patient's current medications.  Assessment/Plan: Colon cancer, stage IIa (T3 N0 M0), sigmoidectomy 04/29/2021 Colonoscopy 03/24/2021-partially obstructing mass in the sigmoid colon, biopsy-adenocarcinoma CTs 03/28/2021-sigmoid colon mass, prominent presacral and sigmoid mesentery lymph nodes-nonspecific, no evidence of distant metastatic disease, sclerotic lesions at the left intertrochanteric region-likely a bone island Sigmoidectomy 04/29/2021-sigmoid colon tumor, 0/16 lymph nodes, no lymphovascular or perineural invasion, no loss of mismatch repair protein expression, MSS Foundation 1-MSS, tumor mutation burden 6, K-ras and NRAS wild-type, ERBB2 amplification-equivocal HER2 2+, negative by FISH Colonoscopy 04/27/2022-polyps removed from the ascending and transverse colon-tubular adenomas 05/31/2022 CEA  8.54 06/28/2022 CEA 9.48 07/10/2022 CTs-new lesion central left hepatic lobe 07/16/2022 MRI 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 Seen by Dr. Modesta Messing with recommendation for a 30-month course of chemotherapy prior to surgery Cycle 1 FOLFOX 08/09/2022 Cycle 2 FOLFOX 08/23/2022 Cycle 3 FOLFOX 09/06/2022 Cycle 4 FOLFOX 09/20/2022 Cycle 5 FOLFOX 10/04/2022 Cycle 6 FOLFOX 10/18/2022 Segment 4A/8 partial hepatectomy 11/14/2022-moderately differentiated adenocarcinoma compatible with a colorectal metastasis, 70% tumor necrosis, resection margin positive, R1 vascular margin per Dr. Modesta Messing, tumor dissected off of the vessel treated with intraoperative argon Cycle 7 FOLFOX 12/20/2022 Cycle 8 FOLFOX 01/03/2023 Cycle 9 FOLFOX 01/24/2023-Emend added Cycle 10 FOLFOX 02/08/2023, oxaliplatin dose reduced due to mild neuropathy symptoms Cycle 11 FOLFOX 02/21/2023, oxaliplatin held due to increased neuropathy symptoms and thrombocytopenia, 5-FU bolus held due to thrombocytopenia Cycle 12 FOLFOX 03/07/2023, oxaliplatin held due to neuropathy CTs 03/19/2023-status post right hepatectomy, no new liver mass, asymmetric wall thickening proximal to the sigmoid anastomosis, new from 10/30/2022 Colonoscopy 03/22/2023-nodular and edematous mucosa in the sigmoid colon 5 cm proximal to the anastomosis, biopsy-ulcerated colonic mucosa with reactive epithelial changes, negative for malignancy, completed a course of antibiotics 05/02/2023-sigmoidoscopy, localized area of mildly nodular mucosa in the mid sigmoid improved, polyp removed from the descending colon, descending colon tubular adenoma, sigmoid polypoid mucosa-hyperplastic polyp, negative for dysplasia CTs at [facility] 07/03/2023: No evidence of metastatic disease, new 4 mm right upper lobe nodule favored inflammatory CTs 09/20/2023-right upper lobe pulmonary nodule measures 4 mm, previously measured 2 mm on CT 07/10/2022.  Prior partial right hepatectomy.  No suspicious  liver lesion.   2.   Rectal bleeding, appointment with gastroenterology 09/25/2022-anal fissure per GI exam 09/11/2022.  Completed treatment for an anal fissure.  Flexible sigmoidoscopy 10/03/2022-moderate diverticulosis in the sigmoid colon and in  the descending colon; patent end-to-side colocolonic anastomosis; anal papilla hypertrophied.  Rectal bleeding felt to be due to anal fissure.    Disposition: Ms. Kovatch appears stable.  We reviewed the CT images from 09/20/2023 as well as multiple prior CTs including the scans done at [facility] on 07/03/2023.  The right upper lobe nodule appears stable compared to 07/03/2023, slightly larger compared to 07/10/2022.  Plan to continue to monitor.  She is scheduled for CTs at [facility] in about 3 months.  She agrees with this plan.  Recent CEA was stable in normal range.  She will continue Port-A-Cath flushes every 8 weeks.  We will see her in follow-up in August which will be about 3 months after the appointment at [place].  We are available to see her sooner if needed.  Patient seen with Dr. Truett Perna.    Lonna Cobb ANP/GNP-BC   09/28/2023  9:06 AM  This was a shared visit with Lonna Cobb.  We reviewed the restaging CT findings and images with Ms. Kareem.  The right lung nodule noted on the 09/20/2023 CT appears unchanged compared to the [facility] CT 07/03/2023, but was present in in 2022 and appears larger compared to the August 2022 study.  I will asked Dr. Modesta Messing to review the images.  She is scheduled to follow-up at [facility] in May.  I was present for greater than 50% of today's visit.  I performed Medical Decision Making.  Mancel Bale, MD

## 2023-09-30 DIAGNOSIS — R739 Hyperglycemia, unspecified: Secondary | ICD-10-CM | POA: Insufficient documentation

## 2023-09-30 NOTE — Assessment & Plan Note (Signed)
 Supplement and monitor

## 2023-09-30 NOTE — Assessment & Plan Note (Signed)
 hgba1c acceptable, minimize simple carbs. Increase exercise as tolerated.

## 2023-09-30 NOTE — Assessment & Plan Note (Signed)
 Encouraged to get adequate exercise, calcium and vitamin d intake

## 2023-09-30 NOTE — Assessment & Plan Note (Signed)
 Encourage heart healthy diet such as MIND or DASH diet, increase exercise, avoid trans fats, simple carbohydrates and processed foods, consider a krill or fish or flaxseed oil cap daily.

## 2023-10-01 ENCOUNTER — Ambulatory Visit: Payer: Medicare Other | Admitting: Family Medicine

## 2023-10-01 VITALS — BP 114/70 | HR 79 | Temp 97.9°F | Resp 18 | Ht 64.0 in | Wt 154.2 lb

## 2023-10-01 DIAGNOSIS — E785 Hyperlipidemia, unspecified: Secondary | ICD-10-CM

## 2023-10-01 DIAGNOSIS — D649 Anemia, unspecified: Secondary | ICD-10-CM | POA: Diagnosis not present

## 2023-10-01 DIAGNOSIS — Z85038 Personal history of other malignant neoplasm of large intestine: Secondary | ICD-10-CM

## 2023-10-01 DIAGNOSIS — M810 Age-related osteoporosis without current pathological fracture: Secondary | ICD-10-CM

## 2023-10-01 DIAGNOSIS — R739 Hyperglycemia, unspecified: Secondary | ICD-10-CM

## 2023-10-01 DIAGNOSIS — E559 Vitamin D deficiency, unspecified: Secondary | ICD-10-CM | POA: Diagnosis not present

## 2023-10-01 LAB — LIPID PANEL
Cholesterol: 218 mg/dL — ABNORMAL HIGH (ref 0–200)
HDL: 57 mg/dL (ref >39.00)
LDL Cholesterol: 117 mg/dL — ABNORMAL HIGH (ref 0–99)
NonHDL: 160.58
Total CHOL/HDL Ratio: 4
Triglycerides: 218 mg/dL — ABNORMAL HIGH (ref 0.0–149.0)
VLDL: 43.6 mg/dL — ABNORMAL HIGH (ref 0.0–40.0)

## 2023-10-01 LAB — COMPREHENSIVE METABOLIC PANEL
ALT: 20 U/L (ref 0–35)
AST: 19 U/L (ref 0–37)
Albumin: 4.4 g/dL (ref 3.5–5.2)
Alkaline Phosphatase: 69 U/L (ref 39–117)
BUN: 12 mg/dL (ref 6–23)
CO2: 31 meq/L (ref 19–32)
Calcium: 9.5 mg/dL (ref 8.4–10.5)
Chloride: 102 meq/L (ref 96–112)
Creatinine, Ser: 0.73 mg/dL (ref 0.40–1.20)
GFR: 85.71 mL/min (ref >60.00)
Glucose, Bld: 93 mg/dL (ref 70–99)
Potassium: 5 meq/L (ref 3.5–5.1)
Sodium: 139 meq/L (ref 135–145)
Total Bilirubin: 1.1 mg/dL (ref 0.2–1.2)
Total Protein: 6.9 g/dL (ref 6.0–8.3)

## 2023-10-01 LAB — CBC WITH DIFFERENTIAL/PLATELET
Basophils Absolute: 0 10*3/uL (ref 0.0–0.1)
Basophils Relative: 0.6 % (ref 0.0–3.0)
Eosinophils Absolute: 0 10*3/uL (ref 0.0–0.7)
Eosinophils Relative: 1.1 % (ref 0.0–5.0)
HCT: 38.7 % (ref 36.0–46.0)
Hemoglobin: 13.2 g/dL (ref 12.0–15.0)
Lymphocytes Relative: 30.2 % (ref 12.0–46.0)
Lymphs Abs: 1.3 10*3/uL (ref 0.7–4.0)
MCHC: 34 g/dL (ref 30.0–36.0)
MCV: 94.1 fL (ref 78.0–100.0)
Monocytes Absolute: 0.3 10*3/uL (ref 0.1–1.0)
Monocytes Relative: 7.4 % (ref 3.0–12.0)
Neutro Abs: 2.6 10*3/uL (ref 1.4–7.7)
Neutrophils Relative %: 60.7 % (ref 43.0–77.0)
Platelets: 220 10*3/uL (ref 150.0–400.0)
RBC: 4.12 Mil/uL (ref 3.87–5.11)
RDW: 13.7 % (ref 11.5–15.5)
WBC: 4.3 10*3/uL (ref 4.0–10.5)

## 2023-10-01 LAB — TSH: TSH: 1.44 u[IU]/mL (ref 0.35–5.50)

## 2023-10-01 LAB — HEMOGLOBIN A1C: Hgb A1c MFr Bld: 5.5 % (ref 4.6–6.5)

## 2023-10-01 NOTE — Patient Instructions (Signed)
 Tinnitus Tinnitus is when you hear a sound that there's no actual source for. It may sound like ringing in your ears or something else. The sound may be loud, soft, or somewhere in between. It can last for a few seconds or be constant for days. It can come and go. Almost everyone has tinnitus at some point. It's not the same as hearing loss. But you may need to see a health care provider if: It lasts for a long time. It comes back often. You have trouble sleeping and focusing. What are the causes? The cause of tinnitus is often unknown. In some cases, you may get it if: You're around loud noises, such as from machines or music. An object gets stuck in your ear. Earwax builds up in Landscape architect. You drink a lot of alcohol or caffeine. You take certain medicines. You start to lose your hearing. You may also get it from some medical conditions. These may include: Ear or sinus infections. Heart diseases. High blood pressure. Allergies. Mnire's disease. Problems with your thyroid. A tumor. This is a growth of cells that isn't normal. A weak, bulging blood vessel called an aneurysm near your ear. What increases the risk? You may be more likely to get tinnitus if: You're around loud noises a lot. You're older. You drink alcohol. You smoke. What are the signs or symptoms? The main symptom is hearing a sound that there's no source for. It may sound like ringing. It may also sound like: Buzzing. Sizzling. Blowing air. Hissing. Whistling. Other sounds may include: Roaring. Running water. A musical note. Tapping. Humming. You may have symptoms in one ear or both ears. How is this diagnosed? Tinnitus is diagnosed based on your symptoms, your medical history, and an exam. Your provider may do a full hearing test if your tinnitus: Is in just one ear. Makes it hard for you to hear. Lasts 6 months or longer. You may also need to see an expert in hearing disorders called an audiologist.  They may ask you about your symptoms and how tinnitus affects your daily life. You may have other tests done. These may include: A CT scan. An MRI. An angiogram. This shows how blood flows through your blood vessels. How is this treated? Treatment may include: Therapy to help you manage the stress of living with tinnitus. Finding ways to mask or cover the sound of tinnitus. These include: Sound or white noise machines. Devices that fit in your ear and play sounds or music. A loud humidifier. Acoustic neural stimulation. This is when you use headphones to listen to music that has a special signal in it. Over time, this signal may change some of the pathways in your brain. This can make you less sensitive to tinnitus. This treatment is used for very severe cases. Using hearing aids or cochlear implants if your tinnitus is from hearing loss. If your tinnitus is caused by a medical condition, treating the condition may make it go away.  Follow these instructions at home: Managing symptoms     Try to avoid being in loud places or around loud noises. Wear earplugs or headphones when you're around loud noises. Find ways to reduce stress. These may include meditation, yoga, or deep breathing. Sleep with your head slightly raised. General instructions Take over-the-counter and prescription medicines only as told by your provider. Track the things that cause symptoms (triggers). Try to avoid these things. To stop your tinnitus from getting worse: Do not drink alcohol. Do  not have caffeine. Do not use any products that contain nicotine or tobacco. These products include cigarettes, chewing tobacco, and vaping devices, such as e-cigarettes. If you need help quitting, ask your provider. Avoid using too much salt. Get enough sleep each night. Where to find more information American Tinnitus Association: https://www.johnson-hamilton.org/ Contact a health care provider if: Your symptoms last for 3 weeks or longer without  stopping. You have sudden hearing loss. Your symptoms get worse or don't get better with home care. You can't manage the stress of living with tinnitus. Get help right away if: You get tinnitus after a head injury. You have tinnitus and: Dizziness. Nausea and vomiting. Loss of balance. A sudden, severe headache. Changes to your eyesight. Weakness in your face, arms, or legs. These symptoms may be an emergency. Get help right away. Call 911. Do not wait to see if the symptoms will go away. Do not drive yourself to the hospital. This information is not intended to replace advice given to you by your health care provider. Make sure you discuss any questions you have with your health care provider. Document Revised: 11/06/2022 Document Reviewed: 11/06/2022 Elsevier Patient Education  2024 [employer].

## 2023-10-02 ENCOUNTER — Encounter: Payer: Self-pay | Admitting: Family Medicine

## 2023-10-03 ENCOUNTER — Encounter: Payer: Self-pay | Admitting: Family Medicine

## 2023-10-03 MED ORDER — ATORVASTATIN CALCIUM 10 MG PO TABS: 10.0000 mg | ORAL_TABLET | Freq: Every day | ORAL | 3 refills | Status: DC

## 2023-10-03 NOTE — Progress Notes (Signed)
 Subjective:    Patient ID: Joan Owen, female    DOB: 08-05-1956, 68 y.o.   MRN: [id number]  Chief Complaint  Patient presents with  . Follow-up    HPI Discussed the use of AI scribe software for clinical note transcription with the patient, who gave verbal consent to proceed.  History of Present Illness   The patient, with a history of colon cancer and liver metastasis, presents for a routine follow-up. The patient was initially diagnosed with colon cancer in 2022, which metastasized to the liver a year later. The patient underwent liver resection and completed twelve rounds of chemotherapy in 2024. Since the surgery, the patient has had three clear scans, with a minor scare of inflammation that was later deemed non-concerning. The patient reports feeling well overall and has been maintaining an active lifestyle with yoga and body pump exercises since retirement in July.  The patient also reports experiencing tinnitus, which was evaluated by an ear doctor. The patient does not report any hearing loss or difficulty in hearing conversations or watching television. The tinnitus is more noticeable in quiet environments.        Past Medical History:  Diagnosis Date  . Anemia   . BCC (basal cell carcinoma), face 01/05/2015   Lip  . Colon cancer (HCC) 04/29/2021  . Colon polyps    hyperplastic  . Endometriosis   . Gallstones 1981  . History of chicken pox   . Osteopenia 01/05/2015  . Osteoporosis   . PONV (postoperative nausea and vomiting)   . Right shoulder pain 01/05/2015    Past Surgical History:  Procedure Laterality Date  . BASAL CELL CARCINOMA EXCISION     upper lip  . CHOLECYSTECTOMY  08/14/1978  . COLECTOMY  04/29/2021  . COLONOSCOPY    . IR IMAGING GUIDED PORT INSERTION  07/31/2022  . LAPAROSCOPY  08/15/1983  . LIVER RESECTION      Family History  Problem Relation Age of Onset  . Dementia Mother        Alzheimer  . Alzheimer's disease Mother   . Heart  disease Father   . Cancer Sister        skin  . Dementia Sister   . Alzheimer's disease Sister   . Cancer Brother        skin  . Thyroid nodules Daughter   . Colon cancer Neg Hx   . Esophageal cancer Neg Hx   . Rectal cancer Neg Hx   . Stomach cancer Neg Hx   . Colon polyps Neg Hx     Social History   Socioeconomic History  . Marital status: Married    Spouse name: Not on file  . Number of children: 3  . Years of education: Not on file  . Highest education level: Bachelor's degree (e.g., BA, AB, BS)  Occupational History  . Occupation: Network engineer  Tobacco Use  . Smoking status: Never  . Smokeless tobacco: Never  Vaping Use  . Vaping status: Never Used  Substance and Sexual Activity  . Alcohol use: Not Currently    Alcohol/week: 2.0 standard drinks of alcohol    Types: 2 Glasses of wine per week  . Drug use: No  . Sexual activity: Not on file  Other Topics Concern  . Not on file  Social History Narrative  . Not on file   Social Drivers of Health   Financial Resource Strain: Low Risk  (09/25/2023)   Overall Financial Resource Strain (CARDIA)   .  Difficulty of Paying Living Expenses: Not hard at all  Food Insecurity: No Food Insecurity (09/25/2023)   Hunger Vital Sign   . Worried About Programme researcher, broadcasting/film/video in the Last Year: Never true   . Ran Out of Food in the Last Year: Never true  Transportation Needs: No Transportation Needs (09/25/2023)   PRAPARE - Transportation   . Lack of Transportation (Medical): No   . Lack of Transportation (Non-Medical): No  Physical Activity: Sufficiently Active (09/25/2023)   Exercise Vital Sign   . Days of Exercise per Week: 3 days   . Minutes of Exercise per Session: 120 min  Stress: No Stress Concern Present (09/25/2023)   [employer] of Occupational Health - Occupational Stress Questionnaire   . Feeling of Stress : Not at all  Social Connections: Socially Integrated (09/25/2023)   Social Connection and Isolation Panel  [NHANES]   . Frequency of Communication with Friends and Family: More than three times a week   . Frequency of Social Gatherings with Friends and Family: Once a week   . Attends Religious Services: 1 to 4 times per year   . Active Member of Clubs or Organizations: No   . Attends Banker Meetings: More than 4 times per year   . Marital Status: Married  Catering manager Violence: Not At Risk (07/17/2023)   Humiliation, Afraid, Rape, and Kick questionnaire   . Fear of Current or Ex-Partner: No   . Emotionally Abused: No   . Physically Abused: No   . Sexually Abused: No    No outpatient medications prior to visit.   No facility-administered medications prior to visit.    No Known Allergies  Review of Systems  Constitutional:  Negative for fever and malaise/fatigue.  HENT:  Positive for tinnitus. Negative for congestion.   Eyes:  Negative for blurred vision.  Respiratory:  Negative for shortness of breath.   Cardiovascular:  Negative for chest pain, palpitations and leg swelling.  Gastrointestinal:  Negative for abdominal pain, blood in stool and nausea.  Genitourinary:  Negative for dysuria and frequency.  Musculoskeletal:  Negative for falls.  Skin:  Negative for rash.  Neurological:  Negative for dizziness, loss of consciousness and headaches.  Endo/Heme/Allergies:  Negative for environmental allergies.  Psychiatric/Behavioral:  Negative for depression. The patient is not nervous/anxious.        Objective:    Physical Exam Constitutional:      General: She is not in acute distress.    Appearance: Normal appearance. She is well-developed. She is not toxic-appearing.  HENT:     Head: Normocephalic and atraumatic.     Right Ear: External ear normal.     Left Ear: External ear normal.     Nose: Nose normal.  Eyes:     General:        Right eye: No discharge.        Left eye: No discharge.     Conjunctiva/sclera: Conjunctivae normal.  Neck:     Thyroid: No  thyromegaly.  Cardiovascular:     Rate and Rhythm: Normal rate and regular rhythm.     Heart sounds: Normal heart sounds. No murmur heard. Pulmonary:     Effort: Pulmonary effort is normal. No respiratory distress.     Breath sounds: Normal breath sounds.  Abdominal:     General: Bowel sounds are normal.     Palpations: Abdomen is soft.     Tenderness: There is no abdominal tenderness. There is no guarding.  Musculoskeletal:        General: Normal range of motion.     Cervical back: Neck supple.  Lymphadenopathy:     Cervical: No cervical adenopathy.  Skin:    General: Skin is warm and dry.  Neurological:     Mental Status: She is alert and oriented to person, place, and time.  Psychiatric:        Mood and Affect: Mood normal.        Behavior: Behavior normal.        Thought Content: Thought content normal.        Judgment: Judgment normal.    BP 114/70 (BP Location: Left Arm, Patient Position: Sitting, Cuff Size: Normal)   Pulse 79   Temp 97.9 F (36.6 C) (Oral)   Resp 18   Ht 5\' 4"  (1.626 m)   Wt 154 lb 3.2 oz (69.9 kg)   LMP 03/28/2013   SpO2 97%   BMI 26.47 kg/m  Wt Readings from Last 3 Encounters:  10/01/23 154 lb 3.2 oz (69.9 kg)  09/28/23 153 lb 11.2 oz (69.7 kg)  08/24/23 149 lb 4.8 oz (67.7 kg)    Diabetic Foot Exam - Simple   No data filed    Lab Results  Component Value Date   WBC 4.3 10/01/2023   HGB 13.2 10/01/2023   HCT 38.7 10/01/2023   PLT 220.0 10/01/2023   GLUCOSE 93 10/01/2023   CHOL 218 (H) 10/01/2023   TRIG 218.0 (H) 10/01/2023   HDL 57.00 10/01/2023   LDLDIRECT 119.0 03/12/2023   LDLCALC 117 (H) 10/01/2023   ALT 20 10/01/2023   AST 19 10/01/2023   NA 139 10/01/2023   K 5.0 10/01/2023   CL 102 10/01/2023   CREATININE 0.73 10/01/2023   BUN 12 10/01/2023   CO2 31 10/01/2023   TSH 1.44 10/01/2023   HGBA1C 5.5 10/01/2023    Lab Results  Component Value Date   TSH 1.44 10/01/2023   Lab Results  Component Value Date   WBC  4.3 10/01/2023   HGB 13.2 10/01/2023   HCT 38.7 10/01/2023   MCV 94.1 10/01/2023   PLT 220.0 10/01/2023   Lab Results  Component Value Date   NA 139 10/01/2023   K 5.0 10/01/2023   CO2 31 10/01/2023   GLUCOSE 93 10/01/2023   BUN 12 10/01/2023   CREATININE 0.73 10/01/2023   BILITOT 1.1 10/01/2023   ALKPHOS 69 10/01/2023   AST 19 10/01/2023   ALT 20 10/01/2023   PROT 6.9 10/01/2023   ALBUMIN 4.4 10/01/2023   CALCIUM 9.5 10/01/2023   ANIONGAP 7 09/20/2023   GFR 85.71 10/01/2023   Lab Results  Component Value Date   CHOL 218 (H) 10/01/2023   Lab Results  Component Value Date   HDL 57.00 10/01/2023   Lab Results  Component Value Date   LDLCALC 117 (H) 10/01/2023   Lab Results  Component Value Date   TRIG 218.0 (H) 10/01/2023   Lab Results  Component Value Date   CHOLHDL 4 10/01/2023   Lab Results  Component Value Date   HGBA1C 5.5 10/01/2023       Assessment & Plan:  Hyperlipidemia, unspecified hyperlipidemia type Assessment & Plan: Encourage heart healthy diet such as MIND or DASH diet, increase exercise, avoid trans fats, simple carbohydrates and processed foods, consider a krill or fish or flaxseed oil cap daily.   Orders: -     Lipid panel -     TSH -  Lipid panel; Future  Osteoporosis, unspecified osteoporosis type, unspecified pathological fracture presence Assessment & Plan: Encouraged to get adequate exercise, calcium and vitamin d intake   Orders: -     TSH  Vitamin D deficiency Assessment & Plan: Supplement and monitor   Hyperglycemia Assessment & Plan: hgba1c acceptable, minimize simple carbs. Increase exercise as tolerated.   Orders: -     Comprehensive metabolic panel -     Hemoglobin A1c -     TSH -     Comprehensive metabolic panel; Future  History of colon cancer -     CBC with Differential/Platelet  Anemia, unspecified type -     CBC with Differential/Platelet  Other orders -     Atorvastatin Calcium; Take 1  tablet (10 mg total) by mouth daily.  Dispense: 90 tablet; Refill: 3    Assessment and Plan    Colon Cancer with liver metastasis Diagnosed in 2022, metastasis to liver in 2023. Underwent chemotherapy and liver resection in 2024. Recent scans have been clear, and no current symptoms reported. -Continue current management and surveillance.  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 No significant hearing loss reported. -Continue monitoring symptoms. -Consider audiology evaluation if hearing loss becomes apparent. -Continue healthy lifestyle habits including balanced diet, hydration, and fatty acid supplementation.  Impaired Glucose Tolerance Slightly elevated blood sugar noted in previous tests. -Order blood work today to monitor glucose levels. -Encourage continued healthy lifestyle habits including balanced diet and regular exercise.  Follow-up -Return in fall 2025 for annual exam. -If all remains stable, consider extending follow-up to yearly.         Danise Edge, MD

## 2023-10-05 DIAGNOSIS — C189 Malignant neoplasm of colon, unspecified: Secondary | ICD-10-CM | POA: Diagnosis not present

## 2023-10-05 DIAGNOSIS — C787 Secondary malignant neoplasm of liver and intrahepatic bile duct: Secondary | ICD-10-CM | POA: Diagnosis not present

## 2023-10-09 ENCOUNTER — Other Ambulatory Visit: Payer: Medicare Other | Admitting: Oncology

## 2023-10-15 ENCOUNTER — Encounter: Payer: Self-pay | Admitting: Oncology

## 2023-11-14 ENCOUNTER — Inpatient Hospital Stay: Attending: Oncology

## 2023-11-14 DIAGNOSIS — C187 Malignant neoplasm of sigmoid colon: Secondary | ICD-10-CM | POA: Insufficient documentation

## 2023-11-14 DIAGNOSIS — Z452 Encounter for adjustment and management of vascular access device: Secondary | ICD-10-CM | POA: Insufficient documentation

## 2023-11-14 DIAGNOSIS — Z95828 Presence of other vascular implants and grafts: Secondary | ICD-10-CM

## 2023-11-14 MED ORDER — HEPARIN SOD (PORK) LOCK FLUSH 100 UNIT/ML IV SOLN
500.0000 [IU] | Freq: Once | INTRAVENOUS | Status: AC
  Administered 2023-11-14: 500 [IU] via INTRAVENOUS

## 2023-11-14 MED ORDER — SODIUM CHLORIDE 0.9% FLUSH
10.0000 mL | Freq: Once | INTRAVENOUS | Status: AC
  Administered 2023-11-14: 10 mL via INTRAVENOUS

## 2023-11-16 ENCOUNTER — Inpatient Hospital Stay: Payer: Medicare Other

## 2023-12-31 ENCOUNTER — Other Ambulatory Visit: Payer: Self-pay | Admitting: *Deleted

## 2023-12-31 DIAGNOSIS — Z79899 Other long term (current) drug therapy: Secondary | ICD-10-CM | POA: Diagnosis not present

## 2023-12-31 DIAGNOSIS — C187 Malignant neoplasm of sigmoid colon: Secondary | ICD-10-CM

## 2023-12-31 DIAGNOSIS — C787 Secondary malignant neoplasm of liver and intrahepatic bile duct: Secondary | ICD-10-CM | POA: Diagnosis not present

## 2023-12-31 DIAGNOSIS — C19 Malignant neoplasm of rectosigmoid junction: Secondary | ICD-10-CM | POA: Diagnosis not present

## 2023-12-31 DIAGNOSIS — C189 Malignant neoplasm of colon, unspecified: Secondary | ICD-10-CM | POA: Diagnosis not present

## 2023-12-31 DIAGNOSIS — R911 Solitary pulmonary nodule: Secondary | ICD-10-CM | POA: Diagnosis not present

## 2024-01-01 ENCOUNTER — Other Ambulatory Visit: Payer: Medicare Other

## 2024-01-01 ENCOUNTER — Inpatient Hospital Stay
Admission: RE | Admit: 2024-01-01 | Discharge: 2024-01-01 | Disposition: A | Discharge status: Home / Self Care | Payer: Self-pay | Source: Ambulatory Visit | Attending: Oncology | Admitting: Oncology

## 2024-01-01 ENCOUNTER — Other Ambulatory Visit: Payer: Self-pay | Admitting: *Deleted

## 2024-01-01 DIAGNOSIS — C187 Malignant neoplasm of sigmoid colon: Secondary | ICD-10-CM

## 2024-01-01 NOTE — Progress Notes (Signed)
 Per Dr. Scherrie Curt: Needs lab/flush/CT scan 1 week prior to OV on 8/25 and to compare with [facility] CT dated 12/31/23. Order placed (awaiting managed care to determine if can be done at [facility] due to [facility] plan). Notified radiology to upload [facility] scan.

## 2024-01-10 ENCOUNTER — Encounter: Payer: Self-pay | Admitting: Oncology

## 2024-01-14 ENCOUNTER — Other Ambulatory Visit

## 2024-01-21 ENCOUNTER — Telehealth: Payer: Self-pay | Admitting: Family Medicine

## 2024-01-21 ENCOUNTER — Other Ambulatory Visit (INDEPENDENT_AMBULATORY_CARE_PROVIDER_SITE_OTHER)

## 2024-01-21 ENCOUNTER — Other Ambulatory Visit: Payer: Self-pay | Admitting: Family Medicine

## 2024-01-21 DIAGNOSIS — R739 Hyperglycemia, unspecified: Secondary | ICD-10-CM | POA: Diagnosis not present

## 2024-01-21 DIAGNOSIS — E785 Hyperlipidemia, unspecified: Secondary | ICD-10-CM

## 2024-01-21 LAB — LIPID PANEL
Cholesterol: 214 mg/dL — ABNORMAL HIGH (ref 0–200)
HDL: 56.5 mg/dL (ref >39.00)
LDL Cholesterol: 132 mg/dL — ABNORMAL HIGH (ref 0–99)
NonHDL: 157.32
Total CHOL/HDL Ratio: 4
Triglycerides: 125 mg/dL (ref 0.0–149.0)
VLDL: 25 mg/dL (ref 0.0–40.0)

## 2024-01-21 NOTE — Telephone Encounter (Signed)
 Good morning this pt had a lab appointment with orders for a lipid and a comp from you. I saw in her chart she had a comp done on 12/31/2023 with [facility] and the results are in. I only released the lipid panel today because of that however if you need to me add on the comp under you I can please let me know

## 2024-01-22 ENCOUNTER — Ambulatory Visit: Payer: Self-pay | Admitting: Family Medicine

## 2024-02-11 DIAGNOSIS — K080 Exfoliation of teeth due to systemic causes: Secondary | ICD-10-CM | POA: Diagnosis not present

## 2024-02-14 ENCOUNTER — Encounter: Payer: Self-pay | Admitting: *Deleted

## 2024-02-14 DIAGNOSIS — H524 Presbyopia: Secondary | ICD-10-CM | POA: Diagnosis not present

## 2024-03-03 ENCOUNTER — Inpatient Hospital Stay

## 2024-03-03 ENCOUNTER — Inpatient Hospital Stay: Payer: Medicare Other | Attending: Oncology

## 2024-03-03 ENCOUNTER — Inpatient Hospital Stay: Attending: Oncology

## 2024-03-03 DIAGNOSIS — Z1283 Encounter for screening for malignant neoplasm of skin: Secondary | ICD-10-CM | POA: Diagnosis not present

## 2024-03-03 DIAGNOSIS — C187 Malignant neoplasm of sigmoid colon: Secondary | ICD-10-CM | POA: Diagnosis not present

## 2024-03-03 DIAGNOSIS — D225 Melanocytic nevi of trunk: Secondary | ICD-10-CM | POA: Diagnosis not present

## 2024-03-03 DIAGNOSIS — L820 Inflamed seborrheic keratosis: Secondary | ICD-10-CM | POA: Diagnosis not present

## 2024-03-03 DIAGNOSIS — Z95828 Presence of other vascular implants and grafts: Secondary | ICD-10-CM

## 2024-03-03 LAB — BASIC METABOLIC PANEL - CANCER CENTER ONLY
Anion gap: 10 (ref 5–15)
BUN: 16 mg/dL (ref 8–23)
CO2: 25 mmol/L (ref 22–32)
Calcium: 9.7 mg/dL (ref 8.9–10.3)
Chloride: 103 mmol/L (ref 98–111)
Creatinine: 0.76 mg/dL (ref 0.44–1.00)
GFR, Estimated: >60 mL/min (ref >60)
Glucose, Bld: 103 mg/dL — ABNORMAL HIGH (ref 70–99)
Potassium: 4.2 mmol/L (ref 3.5–5.1)
Sodium: 138 mmol/L (ref 135–145)

## 2024-03-03 LAB — CEA (ACCESS): CEA (CHCC): 1.29 ng/mL (ref 0.00–5.00)

## 2024-03-03 MED ORDER — HEPARIN SOD (PORK) LOCK FLUSH 100 UNIT/ML IV SOLN
500.0000 [IU] | Freq: Once | INTRAVENOUS | Status: AC
  Administered 2024-03-03: 500 [IU] via INTRAVENOUS

## 2024-03-03 MED ORDER — SODIUM CHLORIDE 0.9% FLUSH
10.0000 mL | INTRAVENOUS | Status: DC | PRN
  Administered 2024-03-03: 10 mL via INTRAVENOUS

## 2024-03-03 NOTE — Patient Instructions (Signed)

## 2024-03-03 NOTE — Progress Notes (Signed)
 The patient was stable upon discharge from the cancer center, with no complaints reported at that time.

## 2024-03-06 ENCOUNTER — Ambulatory Visit: Payer: Self-pay | Admitting: Oncology

## 2024-03-06 NOTE — Telephone Encounter (Signed)
-----   Message from Arley Hof sent at 03/06/2024  5:20 PM EDT ----- Please call patient, the CEA is normal, follow-up as scheduled  ----- Message ----- From: Debby Olam POUR, NP Sent: 03/06/2024   3:58 PM EDT To: Arley KATHEE Hof, MD   ----- Message ----- From: Rebecka, Lab In [place] Sent: 03/03/2024  10:45 AM EDT To: Olam POUR Debby, NP

## 2024-03-06 NOTE — Telephone Encounter (Signed)
 Patient gave verbal understanding and had no further questions or concerns

## 2024-03-13 ENCOUNTER — Encounter: Payer: Medicare Other | Admitting: Family Medicine

## 2024-03-24 ENCOUNTER — Ambulatory Visit (HOSPITAL_BASED_OUTPATIENT_CLINIC_OR_DEPARTMENT_OTHER)
Admission: RE | Admit: 2024-03-24 | Discharge: 2024-03-24 | Disposition: A | Discharge status: Home / Self Care | Source: Ambulatory Visit | Attending: Oncology | Admitting: Oncology

## 2024-03-24 ENCOUNTER — Inpatient Hospital Stay: Attending: Oncology

## 2024-03-24 DIAGNOSIS — C7801 Secondary malignant neoplasm of right lung: Secondary | ICD-10-CM | POA: Insufficient documentation

## 2024-03-24 DIAGNOSIS — R911 Solitary pulmonary nodule: Secondary | ICD-10-CM | POA: Diagnosis not present

## 2024-03-24 DIAGNOSIS — Z1211 Encounter for screening for malignant neoplasm of colon: Secondary | ICD-10-CM | POA: Diagnosis not present

## 2024-03-24 DIAGNOSIS — C187 Malignant neoplasm of sigmoid colon: Secondary | ICD-10-CM | POA: Insufficient documentation

## 2024-03-24 DIAGNOSIS — Z452 Encounter for adjustment and management of vascular access device: Secondary | ICD-10-CM | POA: Diagnosis not present

## 2024-03-24 DIAGNOSIS — I700 Atherosclerosis of aorta: Secondary | ICD-10-CM | POA: Diagnosis not present

## 2024-03-24 MED ORDER — IOHEXOL 300 MG/ML  SOLN
100.0000 mL | Freq: Once | INTRAMUSCULAR | Status: AC | PRN
  Administered 2024-03-24 (×2): 100 mL via INTRAVENOUS

## 2024-03-24 MED ORDER — HEPARIN SOD (PORK) LOCK FLUSH 100 UNIT/ML IV SOLN: 500.0000 [IU] | Freq: Once | INTRAVENOUS | Status: DC

## 2024-03-25 ENCOUNTER — Inpatient Hospital Stay: Admitting: Oncology

## 2024-03-25 VITALS — BP 130/84 | HR 84 | Temp 98.1°F | Resp 18 | Ht 64.0 in | Wt 155.0 lb

## 2024-03-25 DIAGNOSIS — C7801 Secondary malignant neoplasm of right lung: Secondary | ICD-10-CM | POA: Diagnosis not present

## 2024-03-25 DIAGNOSIS — C187 Malignant neoplasm of sigmoid colon: Secondary | ICD-10-CM | POA: Diagnosis not present

## 2024-03-25 DIAGNOSIS — Z452 Encounter for adjustment and management of vascular access device: Secondary | ICD-10-CM | POA: Diagnosis not present

## 2024-03-25 DIAGNOSIS — Z85038 Personal history of other malignant neoplasm of large intestine: Secondary | ICD-10-CM

## 2024-03-25 NOTE — Progress Notes (Signed)
 [facility] OFFICE PROGRESS NOTE   Diagnosis: Colon cancer  INTERVAL HISTORY:   Joan Owen reports feeling well.  No complaint.  She underwent restaging evaluation at [facility] in May.  There was no evidence of disease progression.  She underwent scheduled restaging CTs yesterday.  Objective:  Vital signs in last 24 hours:  Blood pressure 130/84, pulse 84, temperature 98.1 F (36.7 C), temperature source Temporal, resp. rate 18, height 5' 4 (1.626 m), weight 155 lb (70.3 kg), last menstrual period 03/28/2013, SpO2 100%.    Lymphatics: No cervical, supraclavicular, axillary, or inguinal nodes Resp: Lungs clear bilaterally Cardio: Regular rate and rhythm GI: No mass, nontender, no hepatosplenomegaly Vascular: No leg edema   Portacath/PICC-without erythema  Lab Results:  Lab Results  Component Value Date   WBC 4.3 10/01/2023   HGB 13.2 10/01/2023   HCT 38.7 10/01/2023   MCV 94.1 10/01/2023   PLT 220.0 10/01/2023   NEUTROABS 2.6 10/01/2023    CMP  Lab Results  Component Value Date   NA 138 03/03/2024   K 4.2 03/03/2024   CL 103 03/03/2024   CO2 25 03/03/2024   GLUCOSE 103 (H) 03/03/2024   BUN 16 03/03/2024   CREATININE 0.76 03/03/2024   CALCIUM  9.7 03/03/2024   PROT 6.9 10/01/2023   ALBUMIN 4.4 10/01/2023   AST 19 10/01/2023   ALT 20 10/01/2023   ALKPHOS 69 10/01/2023   BILITOT 1.1 10/01/2023   GFRNONAA >60 03/03/2024    Lab Results  Component Value Date   CEA 1.29 03/03/2024    No results found for: INR, LABPROT  Imaging:  CT CHEST ABDOMEN PELVIS W CONTRAST Result Date: 03/24/2024 CLINICAL DATA:  Colon cancer surveillance.  * Tracking Code: BO * EXAM: CT CHEST, ABDOMEN, AND PELVIS WITH CONTRAST TECHNIQUE: Multidetector CT imaging of the chest, abdomen and pelvis was performed following the standard protocol during bolus administration of intravenous contrast. RADIATION DOSE REDUCTION: This exam was performed according to the  departmental dose-optimization program which includes automated exposure control, adjustment of the mA and/or kV according to patient size and/or use of iterative reconstruction technique. CONTRAST:  OMNIPAQUE  IOHEXOL  300 MG/ML  SOLN COMPARISON:  09/20/2023. FINDINGS: CT CHEST FINDINGS Cardiovascular: Right IJ Port-A-Cath terminates in the right atrium. Atherosclerotic calcification of the aorta and left anterior descending coronary artery. Heart is at the upper limits of normal in size. No pericardial effusion. Mediastinum/Nodes: Thoracic inlet lymph nodes are not enlarged by CT size criteria. No pathologically enlarged mediastinal, hilar or axillary lymph nodes. Esophagus is grossly unremarkable. Lungs/Pleura: Posterior segment right upper lobe nodule measures 8 mm (3/57), increased in size from 4 mm on 09/20/2023. No new pulmonary nodules. No pleural fluid. Airway is unremarkable. Musculoskeletal: No worrisome lytic or sclerotic lesions. Minimal degenerative change in the spine. CT ABDOMEN PELVIS FINDINGS Hepatobiliary: Partial left hepatectomy. Liver is otherwise unremarkable. Cholecystectomy. No unexpected biliary ductal dilatation. Pancreas: Negative. Spleen: Negative. Adrenals/Urinary Tract: Adrenal glands are unremarkable. Millimetric low-attenuation lesion in the right kidney, too small to characterize. No specific follow-up necessary. Kidneys are otherwise unremarkable. Ureters are decompressed. Bladder is grossly unremarkable. Stomach/Bowel: Stomach, small bowel and appendix are unremarkable. Sigmoidectomy. Colon is otherwise unremarkable. Vascular/Lymphatic: Atherosclerotic calcification of the aorta. No pathologically enlarged lymph nodes. Reproductive: Uterus is visualized.  No adnexal mass. Other: No free fluid.  Mesenteries and peritoneum are unremarkable. Musculoskeletal: None. IMPRESSION: 1. Enlarging right upper lobe nodule is indicative metastatic disease. 2. No additional evidence of  metastatic disease. 3. Aortic atherosclerosis (  ICD10-I70.0). Left anterior descending coronary calcification. Electronically Signed   By: Newell Eke M.D.   On: 03/24/2024 11:53    Medications: I have reviewed the patient's current medications.   Assessment/Plan: Colon cancer, stage IIa (T3 N0 M0), sigmoidectomy 04/29/2021 Colonoscopy 03/24/2021-partially obstructing mass in the sigmoid colon, biopsy-adenocarcinoma CTs 03/28/2021-sigmoid colon mass, prominent presacral and sigmoid mesentery lymph nodes-nonspecific, no evidence of distant metastatic disease, sclerotic lesions at the left intertrochanteric region-likely a bone island Sigmoidectomy 04/29/2021-sigmoid colon tumor, 0/16 lymph nodes, no lymphovascular or perineural invasion, no loss of mismatch repair protein expression, MSS Foundation 1-MSS, tumor mutation burden 6, K-ras and NRAS wild-type, ERBB2 amplification-equivocal HER2 2+, negative by FISH Colonoscopy 04/27/2022-polyps removed from the ascending and transverse colon-tubular adenomas 05/31/2022 CEA 8.54 06/28/2022 CEA 9.48 07/10/2022 CTs-new lesion central left hepatic lobe 07/16/2022 MRI 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 Seen by Dr. Barbaraann with recommendation for a 45-month course of chemotherapy prior to surgery Cycle 1 FOLFOX 08/09/2022 Cycle 2 FOLFOX 08/23/2022 Cycle 3 FOLFOX 09/06/2022 Cycle 4 FOLFOX 09/20/2022 Cycle 5 FOLFOX 10/04/2022 Cycle 6 FOLFOX 10/18/2022 Segment 4A/8 partial hepatectomy 11/14/2022-moderately differentiated adenocarcinoma compatible with a colorectal metastasis, 70% tumor necrosis, resection margin positive, R1 vascular margin per Dr. Barbaraann, tumor dissected off of the vessel treated with intraoperative argon Cycle 7 FOLFOX 12/20/2022 Cycle 8 FOLFOX 01/03/2023 Cycle 9 FOLFOX 01/24/2023-Emend  added Cycle 10 FOLFOX 02/08/2023, oxaliplatin  dose reduced due to mild neuropathy symptoms Cycle 11 FOLFOX 02/21/2023, oxaliplatin  held  due to increased neuropathy symptoms and thrombocytopenia, 5-FU bolus held due to thrombocytopenia Cycle 12 FOLFOX 03/07/2023, oxaliplatin  held due to neuropathy CTs 03/19/2023-status post right hepatectomy, no new liver mass, asymmetric wall thickening proximal to the sigmoid anastomosis, new from 10/30/2022 Colonoscopy 03/22/2023-nodular and edematous mucosa in the sigmoid colon 5 cm proximal to the anastomosis, biopsy-ulcerated colonic mucosa with reactive epithelial changes, negative for malignancy, completed a course of antibiotics 05/02/2023-sigmoidoscopy, localized area of mildly nodular mucosa in the mid sigmoid improved, polyp removed from the descending colon, descending colon tubular adenoma, sigmoid polypoid mucosa-hyperplastic polyp, negative for dysplasia CTs at [facility] 07/03/2023: No evidence of metastatic disease, new 4 mm right upper lobe nodule favored inflammatory CTs 09/20/2023-right upper lobe pulmonary nodule measures 4 mm, previously measured 2 mm on CT 07/10/2022.  Prior partial right hepatectomy.  No suspicious liver lesion. CTs 12/31/2023-unchanged less than 5 mm right upper lobe nodule,, no evidence of metastatic disease CTs 03/24/2024-enlargement of right upper lobe nodule, 8 mm, no additional evidence of metastatic disease   2.   Rectal bleeding, appointment with gastroenterology 09/25/2022-anal fissure per GI exam 09/11/2022.  Completed treatment for an anal fissure.  Flexible sigmoidoscopy 10/03/2022-moderate diverticulosis in the sigmoid colon and in the descending colon; patent end-to-side colocolonic anastomosis; anal papilla hypertrophied.  Rectal bleeding felt to be due to anal fissure.      Disposition: Joan Owen has a history of metastatic colon cancer.  She underwent a partial hepatectomy in April 2024.  I reviewed the restaging CT findings with her.  We reviewed the CT images from February, May, and August of this year.  The right upper lobe nodule appears to be  enlarging.  We discussed the likelihood the lesion represents metastatic colon cancer.  She will be referred for a staging PET scan.  I will contact Dr. Barbaraann to review the evaluation plan.  I recommend a referral to thoracic surgery or radiation oncology if the lesion is hypermetabolic.  She will be scheduled for a PET scan  and a return visit within the next 2 weeks.  Arley Hof, MD  03/25/2024  11:37 AM

## 2024-03-27 ENCOUNTER — Telehealth: Payer: Self-pay | Admitting: *Deleted

## 2024-03-27 NOTE — Telephone Encounter (Signed)
 Attempted to reach Ms. Beazley re: PET scan. LVM to check for Mychart message.

## 2024-03-31 ENCOUNTER — Telehealth: Payer: Self-pay | Admitting: Oncology

## 2024-03-31 NOTE — Telephone Encounter (Signed)
 Unable to reach via phone, voicemail was left notifying pt of appt change.   Scheduling Message Entered by STEVA DEVERE CROME on 03/31/2024 at 12:28 PM Priority: High <No visit type provided>  Department: [facility]  Provider:  Scheduling Notes:  Please move her appt on 8/27 to 1:30, 45 min (per Dr. Cloretta request)--call patient Thanks

## 2024-04-02 NOTE — Assessment & Plan Note (Deleted)
 Continues to follow with oncology.

## 2024-04-02 NOTE — Assessment & Plan Note (Deleted)
 Encourage heart healthy diet such as MIND or DASH diet, increase exercise, avoid trans fats, simple carbohydrates and processed foods, consider a krill or fish or flaxseed oil cap daily.

## 2024-04-02 NOTE — Assessment & Plan Note (Deleted)
 Supplement and monitor

## 2024-04-02 NOTE — Assessment & Plan Note (Deleted)
 hgba1c acceptable, minimize simple carbs. Increase exercise as tolerated.

## 2024-04-02 NOTE — Assessment & Plan Note (Deleted)
 Encouraged to get adequate exercise, calcium and vitamin d intake

## 2024-04-03 ENCOUNTER — Ambulatory Visit: Payer: Medicare Other | Admitting: Family Medicine

## 2024-04-03 DIAGNOSIS — R739 Hyperglycemia, unspecified: Secondary | ICD-10-CM

## 2024-04-03 DIAGNOSIS — Z85038 Personal history of other malignant neoplasm of large intestine: Secondary | ICD-10-CM

## 2024-04-03 DIAGNOSIS — E785 Hyperlipidemia, unspecified: Secondary | ICD-10-CM

## 2024-04-03 DIAGNOSIS — E559 Vitamin D deficiency, unspecified: Secondary | ICD-10-CM

## 2024-04-03 DIAGNOSIS — M810 Age-related osteoporosis without current pathological fracture: Secondary | ICD-10-CM

## 2024-04-04 ENCOUNTER — Ambulatory Visit

## 2024-04-04 ENCOUNTER — Ambulatory Visit (HOSPITAL_COMMUNITY)
Admission: RE | Admit: 2024-04-04 | Discharge: 2024-04-04 | Disposition: A | Discharge status: Home / Self Care | Source: Ambulatory Visit | Attending: Oncology | Admitting: Oncology

## 2024-04-04 DIAGNOSIS — Z452 Encounter for adjustment and management of vascular access device: Secondary | ICD-10-CM | POA: Diagnosis not present

## 2024-04-04 DIAGNOSIS — Z85038 Personal history of other malignant neoplasm of large intestine: Secondary | ICD-10-CM | POA: Insufficient documentation

## 2024-04-04 DIAGNOSIS — C7801 Secondary malignant neoplasm of right lung: Secondary | ICD-10-CM | POA: Diagnosis not present

## 2024-04-04 DIAGNOSIS — C187 Malignant neoplasm of sigmoid colon: Secondary | ICD-10-CM | POA: Diagnosis not present

## 2024-04-04 DIAGNOSIS — R911 Solitary pulmonary nodule: Secondary | ICD-10-CM | POA: Diagnosis not present

## 2024-04-04 LAB — GLUCOSE, CAPILLARY: Glucose-Capillary: 98 mg/dL (ref 70–99)

## 2024-04-04 MED ORDER — FLUDEOXYGLUCOSE F - 18 (FDG) INJECTION
7.7500 | Freq: Once | INTRAVENOUS | Status: AC
  Administered 2024-04-04: 7.73 via INTRAVENOUS

## 2024-04-07 ENCOUNTER — Inpatient Hospital Stay: Payer: Medicare Other

## 2024-04-07 ENCOUNTER — Inpatient Hospital Stay: Payer: Medicare Other | Admitting: Oncology

## 2024-04-09 ENCOUNTER — Other Ambulatory Visit: Admitting: Oncology

## 2024-04-09 ENCOUNTER — Inpatient Hospital Stay: Admitting: Oncology

## 2024-04-10 ENCOUNTER — Encounter: Payer: Self-pay | Admitting: *Deleted

## 2024-04-10 ENCOUNTER — Inpatient Hospital Stay (HOSPITAL_BASED_OUTPATIENT_CLINIC_OR_DEPARTMENT_OTHER): Admitting: Oncology

## 2024-04-10 VITALS — BP 124/85 | HR 80 | Temp 97.7°F | Resp 18 | Ht 64.0 in | Wt 154.3 lb

## 2024-04-10 DIAGNOSIS — C187 Malignant neoplasm of sigmoid colon: Secondary | ICD-10-CM

## 2024-04-10 DIAGNOSIS — Z452 Encounter for adjustment and management of vascular access device: Secondary | ICD-10-CM | POA: Diagnosis not present

## 2024-04-10 DIAGNOSIS — C7801 Secondary malignant neoplasm of right lung: Secondary | ICD-10-CM | POA: Diagnosis not present

## 2024-04-10 NOTE — Progress Notes (Signed)
 [facility] OFFICE PROGRESS NOTE   Diagnosis: Colon cancer  INTERVAL HISTORY:   Ms. Brisby returns as scheduled.  She feels well.  No complaint.  She had a gum procedure earlier this week.  She is here today with her husband.  Objective:  Vital signs in last 24 hours:  Blood pressure 124/85, pulse 80, temperature 97.7 F (36.5 C), resp. rate 18, height 5' 4 (1.626 m), weight 154 lb 4.8 oz (70 kg), last menstrual period 03/28/2013, SpO2 100%.   Physical examination not performed today  Lab Results:  Lab Results  Component Value Date   WBC 4.3 10/01/2023   HGB 13.2 10/01/2023   HCT 38.7 10/01/2023   MCV 94.1 10/01/2023   PLT 220.0 10/01/2023   NEUTROABS 2.6 10/01/2023    CMP  Lab Results  Component Value Date   NA 138 03/03/2024   K 4.2 03/03/2024   CL 103 03/03/2024   CO2 25 03/03/2024   GLUCOSE 103 (H) 03/03/2024   BUN 16 03/03/2024   CREATININE 0.76 03/03/2024   CALCIUM  9.7 03/03/2024   PROT 6.9 10/01/2023   ALBUMIN 4.4 10/01/2023   AST 19 10/01/2023   ALT 20 10/01/2023   ALKPHOS 69 10/01/2023   BILITOT 1.1 10/01/2023   GFRNONAA >60 03/03/2024    Lab Results  Component Value Date   CEA 1.29 03/03/2024    Medications: I have reviewed the patient's current medications.   Assessment/Plan: Colon cancer, stage IIa (T3 N0 M0), sigmoidectomy 04/29/2021 Colonoscopy 03/24/2021-partially obstructing mass in the sigmoid colon, biopsy-adenocarcinoma CTs 03/28/2021-sigmoid colon mass, prominent presacral and sigmoid mesentery lymph nodes-nonspecific, no evidence of distant metastatic disease, sclerotic lesions at the left intertrochanteric region-likely a bone island Sigmoidectomy 04/29/2021-sigmoid colon tumor, 0/16 lymph nodes, no lymphovascular or perineural invasion, no loss of mismatch repair protein expression, MSS Foundation 1-MSS, tumor mutation burden 6, K-ras and NRAS wild-type, ERBB2 amplification-equivocal HER2 2+, negative by  FISH Colonoscopy 04/27/2022-polyps removed from the ascending and transverse colon-tubular adenomas 05/31/2022 CEA 8.54 06/28/2022 CEA 9.48 07/10/2022 CTs-new lesion central left hepatic lobe 07/16/2022 MRI 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 Seen by Dr. Barbaraann with recommendation for a 37-month course of chemotherapy prior to surgery Cycle 1 FOLFOX 08/09/2022 Cycle 2 FOLFOX 08/23/2022 Cycle 3 FOLFOX 09/06/2022 Cycle 4 FOLFOX 09/20/2022 Cycle 5 FOLFOX 10/04/2022 Cycle 6 FOLFOX 10/18/2022 Segment 4A/8 partial hepatectomy 11/14/2022-moderately differentiated adenocarcinoma compatible with a colorectal metastasis, 70% tumor necrosis, resection margin positive, R1 vascular margin per Dr. Barbaraann, tumor dissected off of the vessel treated with intraoperative argon Cycle 7 FOLFOX 12/20/2022 Cycle 8 FOLFOX 01/03/2023 Cycle 9 FOLFOX 01/24/2023-Emend  added Cycle 10 FOLFOX 02/08/2023, oxaliplatin  dose reduced due to mild neuropathy symptoms Cycle 11 FOLFOX 02/21/2023, oxaliplatin  held due to increased neuropathy symptoms and thrombocytopenia, 5-FU bolus held due to thrombocytopenia Cycle 12 FOLFOX 03/07/2023, oxaliplatin  held due to neuropathy CTs 03/19/2023-status post right hepatectomy, no new liver mass, asymmetric wall thickening proximal to the sigmoid anastomosis, new from 10/30/2022 Colonoscopy 03/22/2023-nodular and edematous mucosa in the sigmoid colon 5 cm proximal to the anastomosis, biopsy-ulcerated colonic mucosa with reactive epithelial changes, negative for malignancy, completed a course of antibiotics 05/02/2023-sigmoidoscopy, localized area of mildly nodular mucosa in the mid sigmoid improved, polyp removed from the descending colon, descending colon tubular adenoma, sigmoid polypoid mucosa-hyperplastic polyp, negative for dysplasia CTs at [facility] 07/03/2023: No evidence of metastatic disease, new 4 mm right upper lobe nodule favored inflammatory CTs 09/20/2023-right upper lobe  pulmonary nodule measures 4 mm,  previously measured 2 mm on CT 07/10/2022.  Prior partial right hepatectomy.  No suspicious liver lesion. CTs 12/31/2023-unchanged less than 5 mm right upper lobe nodule,, no evidence of metastatic disease CTs 03/24/2024-enlargement of right upper lobe nodule, 8 mm, no additional evidence of metastatic disease PET 04/04/2024-hypermetabolic right upper lobe nodule, no other evidence of metastatic disease   2.   Rectal bleeding, appointment with gastroenterology 09/25/2022-anal fissure per GI exam 09/11/2022.  Completed treatment for an anal fissure.  Flexible sigmoidoscopy 10/03/2022-moderate diverticulosis in the sigmoid colon and in the descending colon; patent end-to-side colocolonic anastomosis; anal papilla hypertrophied.  Rectal bleeding felt to be due to anal fissure.        Disposition: Joan Owen has a history of metastatic colon cancer.  She underwent a partial right hepatectomy in April 2024.  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  I reviewed the PET findings and images with her.  We discussed parenteral diagnosis and treatment options.  She understands the hypermetabolic lung nodule most likely represents metastatic colon cancer, though she could have a lung primary, or less likely a benign lesion.  We discussed surgical resection and SBRT.  She appears to be a candidate for surgical resection.  I recommend a surgical referral.  I will refer her to Dr. Valli at [facility].  [name] understands the the likelihood of undergoing curative therapy is small, but possible.  She is in agreement.  She will return for an office visit and Port-A-Cath flush in approximately 5 weeks.  Arley Hof, MD  04/10/2024  8:51 AM

## 2024-04-10 NOTE — Progress Notes (Signed)
 Referral placed with Dr Dickey Orem at [facility] for surgical consult. Spoke with office and they are processing her urgent referral.

## 2024-04-17 ENCOUNTER — Other Ambulatory Visit: Payer: Self-pay | Admitting: *Deleted

## 2024-04-17 DIAGNOSIS — C187 Malignant neoplasm of sigmoid colon: Secondary | ICD-10-CM

## 2024-04-17 NOTE — Progress Notes (Signed)
 Per Dr. Cloretta: Needs referral to Dr. Dewey in radiation oncology re: hypermetabolic lung nodule on PET. Referral order placed today. Scheduling message sent to move 10/08 appointments out 2 weeks.

## 2024-04-18 ENCOUNTER — Telehealth: Payer: Self-pay | Admitting: Radiation Oncology

## 2024-04-18 NOTE — Telephone Encounter (Signed)
 Called pt to schedule urgent referral with Dr. Dewey at the request of Dr. Cloretta. Pt refused 9/9 consult and advised she would be travelling the next 2 weeks. She also stated her consult with the surgeon is scheduled week of 9/22 and she did not want consult for RadOnc prior to this. Pt was advised referral was sent urgently but we would be happy to accommodate to her request. She verbalized understanding and requested consult 9/23@2 :30pm.

## 2024-04-21 DIAGNOSIS — Z6825 Body mass index (BMI) 25.0-25.9, adult: Secondary | ICD-10-CM | POA: Diagnosis not present

## 2024-04-21 DIAGNOSIS — Z1231 Encounter for screening mammogram for malignant neoplasm of breast: Secondary | ICD-10-CM | POA: Diagnosis not present

## 2024-04-21 DIAGNOSIS — Z1151 Encounter for screening for human papillomavirus (HPV): Secondary | ICD-10-CM | POA: Diagnosis not present

## 2024-04-21 DIAGNOSIS — M816 Localized osteoporosis [Lequesne]: Secondary | ICD-10-CM | POA: Diagnosis not present

## 2024-04-21 DIAGNOSIS — Z124 Encounter for screening for malignant neoplasm of cervix: Secondary | ICD-10-CM | POA: Diagnosis not present

## 2024-04-21 DIAGNOSIS — Z01419 Encounter for gynecological examination (general) (routine) without abnormal findings: Secondary | ICD-10-CM | POA: Diagnosis not present

## 2024-04-21 LAB — HM DEXA SCAN

## 2024-05-01 NOTE — Progress Notes (Addendum)
 Thoracic Surgery Clinic - New Patient Visit  PATIENT IDENTIFICATION  Joan Owen is a 68 y.o. female from [address] [place] [address] seen in consultation at the request of Dr. Arley Hof, [facility] medical oncology, for evaluation and management of RUL pulmonary nodule.  Reason for Visit  RUL pulmonary nodule  History of Present Illness  Joan Owen [id number] is a 68 y.o. female referred for evaluation of a gradually enlarging 8mm RUL pulmonary nodule in the setting of metastatic colon cancer. She was diagnosed with colon cancer in 2022, and was treated with sigmoidectomy. In 07/2022 metastatic disease to the liver was identified and received FOLFOX systemic therapy and partial hepatectomy. She received additional adjuvant FOLFOX after the hepatectomy.  CT chest abdomen pelvis scan on 03/24/24, which demonstrated an interval increase in size of posterior segment right upper lobe lung nodule now measuring up to 8 mm. PET/CT scan on 04/04/24, demonstrated hypermetabolic activity in lung nodule and no other evidence of metastatic disease.   Today she is in her USOH and feels well. She has 6 granchildren and helps to care for them. No activity limitations.  She denies chest pain, shortness of breath, cough, hemoptysis, fever, night sweats, headaches, and visual disturbances. Appetite is good and bowel habits are regular. Weight is stable.  She has a right chest wall port. There has been no other chest wall surgery or previous trauma to the chest.  Ms. Chauca presents to clinic today along with her husband. Her husband is visually impaired.   1) Colon Cancer (2022) Medical oncology: Dr. Arley Hof - 04/29/21, s/p sigmoidectomy with 0/16 lymph nodes, no lymphovascular invasion. - 07/16/2022, left hepatic liver mass measuring 4.3 cm - 08/09/22 to 10/18/22, 6 cycles of FOLFOX - 11/14/22, s/p segment 4A/8 partial hepatectomy with Dr. Barbaraann at [facility] - 12/20/22 to 02/15/23,  6 additional cycles of FOLFOX   2) Skin Cancer- BCC on face (2022)  Past Medical History significant for HLD, osteoporosis   Smoking Status:  Never smoked cigarettes  Anticoagulation/Antiplatelet therapy: None  GLP-1/SGLT-2 status: none  Past Medical History   Past Medical History:  Diagnosis Date  . History of cancer     Past Surgical History   Past Surgical History:  Procedure Laterality Date  . SIGMOIDOSCOPY FLEXIBLE  10/03/2022  . RESECTION LIVER FOR PARTIAL LOBECTOMY N/A 11/14/2022   Procedure: HEPATECTOMY, RESECTION OF LIVER; PARTIAL LOBECTOMY;  Surgeon: Barbaraann Ozell Birmingham, MD;  Location: [facility] OR;  Service: General Surgery;  Laterality: N/A;  . LAPAROSCOPY DIAGNOSTIC N/A 11/14/2022   Procedure: LAPAROSCOPY, SURGICAL; ABDOMEN, PERITONEUM, AND OMENTUM, DIAGNOSTIC, WITH OR WITHOUT COLLECTION OF SPECIMEN(S) BY BRUSHING OR WASHING (SEPARATE PROCEDURE);  Surgeon: Barbaraann Ozell Birmingham, MD;  Location: [facility] OR;  Service: General Surgery;  Laterality: N/A;  . FLAP MYOCUTANEOUS/FASCIOCUTANEOUS ABDOMEN N/A 11/14/2022   Procedure: MUSCLE, MYOCUTANEOUS, OR FASCIOCUTANEOUS FLAP; ABDOMEN;  Surgeon: Barbaraann Ozell Birmingham, MD;  Location: [facility] OR;  Service: General Surgery;  Laterality: N/A;  . CHOLECYSTECTOMY    . COLONOSCOPY    . ROBOTIC COLECTOMY PARTIAL     04/2021     Social History   Social History   Socioeconomic History  . Marital status: Married    Spouse name: Darnella Zeiter  . Number of children: 3  Tobacco Use  . Smoking status: Never  . Smokeless tobacco: Never  Vaping Use  . Vaping status: Never Used  Substance and Sexual Activity  . Alcohol use: Not Currently  Comment: stopped with recent dx  . Drug use: Never  . Sexual activity: Defer   Social Drivers of Health   Financial Resource Strain: Low Risk  (03/27/2024)   Received from [facility]   Overall Financial Resource Strain (CARDIA)   . How hard is it for you to pay for the very basics like  food, housing, medical care, and heating?: Not hard at all  Food Insecurity: No Food Insecurity (03/27/2024)   Received from [facility]   Hunger Vital Sign   . Within the past 12 months, you worried that your food would run out before you got the money to buy more.: Never true   . Within the past 12 months, the food you bought just didn't last and you didn't have money to get more.: Never true  Transportation Needs: No Transportation Needs (03/27/2024)   Received from [facility] - Transportation   . In the past 12 months, has lack of transportation kept you from medical appointments or from getting medications?: No   . In the past 12 months, has lack of transportation kept you from meetings, work, or from getting things needed for daily living?: No  Physical Activity: Sufficiently Active (03/27/2024)   Received from [facility]   Exercise Vital Sign   . On average, how many days per week do you engage in moderate to strenuous exercise (like a brisk walk)?: 3 days   . On average, how many minutes do you engage in exercise at this level?: 60 min  Stress: No Stress Concern Present (03/27/2024)   Received from [facility] - Occupational Stress Questionnaire   . Do you feel stress - tense, restless, nervous, or anxious, or unable to sleep at night because your mind is troubled all the time - these days?: Not at all  Social Connections: Socially Integrated (03/27/2024)   Received from [facility]   Social Connection and Isolation Panel   . In a typical week, how many times do you talk on the phone with family, friends, or neighbors?: More than three times a week   . How often do you get together with friends or relatives?: Twice a week   . How often do you attend church or religious services?: More than 4 times per year   . Do you belong to any clubs or organizations such as church groups, unions, fraternal or athletic groups, or school groups?:  Yes   . How often do you attend meetings of the clubs or organizations you belong to?: More than 4 times per year   . Are you married, widowed, divorced, separated, never married, or living with a partner?: Married  Housing Stability: Unknown (12/26/2023)   Housing Stability Vital Sign   . Unable to Pay for Housing in the Last Year: No   . Homeless in the Last Year: No     Family History    Family History  Problem Relation Name Age of Onset  . Colon cancer Mother    . Coronary Artery Disease (Blocked arteries around heart) Father    . Anesthesia problems Neg Hx    . Malignant hypertension Neg Hx    . Malignant hyperthermia Neg Hx       Allergies   No Known Allergies   Medications   Current Outpatient Medications  Medication Sig Dispense Refill  . ibuprofen (MOTRIN) 200 MG tablet Take 200 mg by mouth every 8 (eight) hours as  needed for Pain (Patient not taking: Reported on 05/05/2024)    . multivitamin tablet Take 1 tablet by mouth once daily    . cholecalciferol (VITAMIN D3) 2,000 unit capsule Take 2,000 Units by mouth once daily    . docosahexaenoic acid/epa (FISH OIL ORAL) Take by mouth    . Herbal Supplement Herbal Name: Bone up with K2, Calcium , Vit D, Mag, Copper     No current facility-administered medications for this visit.    OBJECTIVE   Vital Signs:  Vitals:   05/05/24 0949  BP: 110/67  Pulse: 69  Resp: 12  Temp: 36.6 C (97.9 F)  TempSrc: Oral  SpO2: 98%  Weight: 67.7 kg (149 lb 4 oz)  Height: 162 cm (5' 3.78)       ECOG Performance Scale:  0 - fully active, able to carry on all pre-disease performance without restriction  Physical Exam  General: well developed, well nourished, and in no acute distress Neurologic:   non-focal exam Psych: oriented to time, place and person, mood and affect are within normal limits, pt is a good historian; no memory problems were noted Skin:  warm and dry Neck:  Supple, no masses, and no tenderness Lymph Nodes:  No  palpable  cervical and supraclavicular lymph nodes Heart:   regular rate and rhythm , S1 and S2 present , no murmur, no rub, and no gallop Breast/Chest Wall: deferred Lungs:  clear to ausculatation bilaterally , no rhonchi, no wheezing, and no crackles GI/Abdomen:  soft , nontender, nondistended, and bowel sounds present  Extremities:   capillary refill < 3 seconds  and no edema   DIAGNOSTIC STUDIES  The following studies were personally reviewed by Dr. Valli in clinic today:   Pulmonary function testing (05/05/2024): FEV1 2.51L, 113% predicted DLCO 16.35, 84.8% predicted  04/04/2024- PET/CT Skull Base to Mid Thigh ([facility])   CLINICAL DATA:  Subsequent treatment strategy for colorectal  carcinoma. Lung nodule    COMPARISON:  CT 03/24/2024   FINDINGS:  NECK: No hypermetabolic lymph nodes in the neck.   Incidental CT findings: None.   CHEST: Round nodule of concern in the RIGHT upper lobe measures 8 mm  on image 65. This nodule has associated metabolic activity with SUV max equal 5.0. No additional pulmonary nodules.   No hypermetabolic mediastinal lymph nodes.   Incidental CT findings: Port in the anterior chest wall with tip in distal SVC.   ABDOMEN/PELVIS: No abnormal metabolic activity liver. Surgical clips  along the anterior margin of the LEFT hepatic lobe. No hypermetabolic abdominopelvic lymph nodes.   Anastomosis in the proximal rectum. No nodularity or abnormal metabolic activity. No hypermetabolic lymph nodes in the colon mesentery.   Incidental CT findings: Uterus and adnexa unremarkable.   SKELETON: No focal hypermetabolic activity to suggest skeletal metastasis.   Incidental CT findings: None.   IMPRESSION:  1. Hypermetabolic RIGHT upper lobe pulmonary nodule is most consistent with solitary pulmonary metastasis.  2. No evidence of local colorectal carcinoma recurrence or metastasis in the abdomen pelvis.      03/24/2024- CT Chest Abdomen Pelvis  with contrast ([facility])   COMPARISON:  09/20/2023.   FINDINGS:  CT CHEST FINDINGS   Cardiovascular: Right IJ Port-A-Cath terminates in the right atrium. Atherosclerotic calcification of the aorta and left anterior descending coronary artery. Heart is at the upper limits of normal in size. No pericardial effusion.   Mediastinum/Nodes: Thoracic inlet lymph nodes are not enlarged by CT  size criteria.  No pathologically enlarged mediastinal, hilar or axillary lymph nodes. Esophagus is grossly unremarkable.   Lungs/Pleura: Posterior segment right upper lobe nodule measures 8 mm (3/57), increased in size from 4 mm on 09/20/2023. No new pulmonary nodules. No pleural fluid. Airway is unremarkable.   Musculoskeletal: No worrisome lytic or sclerotic lesions. Minimal degenerative change in the spine.   CT ABDOMEN PELVIS FINDINGS   Hepatobiliary: Partial left hepatectomy. Liver is otherwise unremarkable. Cholecystectomy. No unexpected biliary ductal dilatation.   Pancreas: Negative.   Spleen: Negative.   Adrenals/Urinary Tract: Adrenal glands are unremarkable. Millimetric low-attenuation lesion in the right kidney, too small to characterize. No specific follow-up necessary. Kidneys are otherwise unremarkable. Ureters are decompressed. Bladder is grossly unremarkable.   Stomach/Bowel: Stomach, small bowel and appendix are unremarkable. Sigmoidectomy. Colon is otherwise unremarkable.   Vascular/Lymphatic: Atherosclerotic calcification of the aorta. No pathologically enlarged lymph nodes.   Reproductive: Uterus is visualized.  No adnexal mass.   Other: No free fluid.  Mesenteries and peritoneum are unremarkable.   Musculoskeletal: None.   IMPRESSION:  1. Enlarging right upper lobe nodule is indicative metastatic disease.  2. No additional evidence of metastatic disease.  3. Aortic atherosclerosis (ICD10-I70.0). Left anterior descending coronary calcification.      12/31/2023- CT  Chest Abdomen Pelvis with contrast ([facility])   Comparison: 09/20/2023    Findings:   Chest:   The visualized thyroid  is normal. No enlarged lower cervical, axillary, mediastinal, or hilar lymph nodes. Nondilated esophagus.   Normal heart size. No pericardial effusion. Scattered coronary calcifications. The thoracic aorta is nondilated. Normal caliber pulmonary arteries. Right internal jugular chest wall port tip terminates at the superior cavoatrial junction.   Mild bibasilar dependent atelectasis. No airspace consolidation, pleural fluid, or pneumothorax. Patent central airways. No suspicious nodules or opacities. Posterior dependent atelectasis. Unchanged lung nodule in the right upper lobe measures [less than 5 mm (301:43).      Abdomen and pelvis:  Postsurgical scarring within the anterior abdominal wall.   Stable postsurgical changes from partial hepatectomy. Scattered areas of hypoattenuation about the resection margin are unchanged and likely postsurgical. No new hepatic lesions. Absent gallbladder. Stable caliber of the biliary tree likely owing to post cholecystectomy state. Normal spleen, pancreas, and adrenal glands and. Symmetric renal parenchymal enhancement. No hydronephrosis. Anteverted uterus. Normal urinary bladder.   Postsurgical changes from prior distal partial colectomy. Previous is seen area of asymmetric anterior colonic wall thickening approximately anastomosis is no longer visualized. Scattered colonic diverticulosis. Relatively low-lying cecum. Normal appendix. Decompressed stomach.   No free air. No enlarged lymph nodes. Mild aortoiliac atherosclerotic plaque without aneurysm. Patent portal veins.   No suspicious osseous lesions.   Impression:   No definite evidence of metastatic disease in the chest, abdomen and pelvis.     09/20/2023- CT Chest Abdomen Pelvis with contrast ([facility])   COMPARISON:  Multiple priors including MRI July 16, 2022 and  CT  July 10, 2022   FINDINGS:  CT CHEST FINDINGS   Cardiovascular: Accessed right chest Port-A-Cath with tip at the superior cavoatrial junction. Aortic atherosclerosis. Normal size heart. Coronary artery calcifications.   Mediastinum/Nodes: No supraclavicular adenopathy. No suspicious thyroid  nodule. No pathologically enlarged mediastinal, hilar or axillary lymph nodes. The esophagus is grossly unremarkable.   Lungs/Pleura: Biapical pleuroparenchymal scarring.   Right upper lobe pulmonary nodule measures 4 mm on image 60/4 previously 2 mm on CT July 10, 2022 and new from CT March 28, 2021.   Musculoskeletal: No aggressive lytic or blastic  lesion of bone.   CT ABDOMEN PELVIS FINDINGS   Hepatobiliary: No suspicious hepatic lesion.   Prior partial right hepatectomy.   Gallbladder surgically absent. Similar prominence of the biliary tree within normal limits for reservoir effect post cholecystectomy.   Pancreas: No pancreatic ductal dilation or evidence of acute inflammation.   Spleen: No splenomegaly.   Adrenals/Urinary Tract: Bilateral adrenal glands appear normal. No hydronephrosis. Kidneys demonstrate symmetric enhancement. Urinary  bladder is unremarkable for degree of distension.   Stomach/Bowel: Radiopaque enteric contrast material traverses distal loops of small bowel. Heterogeneous material in the stomach favored enteric contrast and other ingested gastric contents. No pathologic dilation of small or large bowel. Normal appendix. Left-sided colonic diverticulosis.   Prior sigmoidectomy with rectosigmoid anastomosis in the midline pelvis. No new suspicious nodularity along the suture line.   Vascular/Lymphatic: Normal caliber abdominal aorta. Smooth IVC contours. The portal, splenic and superior mesenteric veins are patent. No pathologically enlarged abdominal or pelvic lymph nodes.   Reproductive: Uterus and bilateral adnexa are unremarkable.   Other: No  significant abdominopelvic free fluid. Surgical clip in the right hemiabdomen. No discrete peritoneal or omental nodularity.   Musculoskeletal: No aggressive lytic or blastic lesion of bone.   IMPRESSION:  1. Prior sigmoidectomy with rectosigmoid anastomosis in the midline pelvis. No new suspicious nodularity along the suture line.  2. No convincing evidence of metastatic disease in the abdomen or pelvis.  3. Slowly enlarging 4 mm right upper lobe pulmonary nodule, nonspecific and warranting continued attention on follow-up imaging.  4.  Aortic Atherosclerosis (ICD10-I70.0).    IMPRESSION AND PLAN     ICD-10-CM   1. Lung nodule  R91.1 lidocaine  (XYLOCAINE ) 1 % injection 0.5 mL    Type And Screen    sodium chloride  0.9% flush inj syringe 5 mL    chlorhexidine  (PERIDEX ) 0.12 % solution 15 mL      Joan Owen is being referred for evaluation of slowly enlarging well circumscribed solid lung nodule in the setting of h/o colorectal cancer metastatic to liver. She currently reports no respiratory symptoms. At today's visit, I personally reviewed all available studies and evaluated the patient. Based on this discussion, I have recommended consideration of resection, which would likely require posterior segmentectomy. To this end, I spoke with Dr. Remi regarding placement of ICG-soaked coil prior to surgery that day. She elected to proceed on May 20, 2024.  Of note, she is meeting with a local Radiation Oncologist tomorrow to discuss SBRT.  We discussed that this is also a reasonable strategy.   I discussed the plan above with the patient. Kasara Schomer Gandolfi expressed an understanding of this discussion and recommendations above. All questions were answered to her satisfaction. Patient seen with Delon Biles, PA-C.  I personally saw the patient and performed a substantive portion of this encounter in conjunction with the listed APP as documented above.  DICKEY JAYSON OREM,  MD   ADDENDUM: Additional information obtained during collection of HPI documented, including ROS, pertinent surgical history, and details related to pt's social history added to document Paz /23/2025, 0955)  Clinical Notes on My Chart: Progress notes documented by your healthcare team are available on my chart portal. We encourage you to review notes after visits and in preparation for upcoming appointments. This provides the opportunity to review recommendations as well as prepare questions for your healthcare team to address during your next visit. If you have specific questions related to the notes, please bring them to your next scheduled  visit to discuss with your physician, nurse practitioner or physician assistant. With increased transparency, our hope is that we can create better communication, more shared decision-making, and increased satisfaction. However, if you have questions regarding the plan of care, these may be managed by calling our office or through MyChart.  Patient Care Team: Domenica Harlene LABOR, MD as PCP - General (Family Medicine) Cloretta Arley Cain, MD (Hematology and Oncology) Pyrtle, Gordy Sayre, MD (Gastroenterology)

## 2024-05-02 ENCOUNTER — Telehealth: Payer: Self-pay

## 2024-05-02 NOTE — Telephone Encounter (Signed)
 LVM for pt to return my phone call in regards to her disability forms.

## 2024-05-05 ENCOUNTER — Telehealth: Payer: Self-pay

## 2024-05-05 DIAGNOSIS — R918 Other nonspecific abnormal finding of lung field: Secondary | ICD-10-CM | POA: Diagnosis not present

## 2024-05-05 DIAGNOSIS — C189 Malignant neoplasm of colon, unspecified: Secondary | ICD-10-CM | POA: Diagnosis not present

## 2024-05-05 DIAGNOSIS — C787 Secondary malignant neoplasm of liver and intrahepatic bile duct: Secondary | ICD-10-CM | POA: Diagnosis not present

## 2024-05-05 DIAGNOSIS — R911 Solitary pulmonary nodule: Secondary | ICD-10-CM | POA: Diagnosis not present

## 2024-05-05 DIAGNOSIS — Z01818 Encounter for other preprocedural examination: Secondary | ICD-10-CM | POA: Diagnosis not present

## 2024-05-05 NOTE — Progress Notes (Signed)
 Thoracic Location of Tumor / Histology: Metastatic colon cancer metastatic to RUL Lung  Patient presented   PET:   Biopsies of    Past/Anticipated interventions by pulmonary, if any:  Past/Anticipated interventions by cardiothoracic surgery, if any:   Past/Anticipated interventions by medical oncology, if any:  Dr. Cloretta 04/10/2024 history of metastatic colon cancer. She underwent a partial right hepatectomy in April 2024.  -She now appears to have an isolated pulmonary metastasis.  -She understands the hypermetabolic lung nodule most likely represents metastatic colon cancer, though she could have a lung primary, or less likely a benign lesion.  -We discussed surgical resection and SBRT.  She appears to be a candidate for surgical resection. I recommend a surgical referral. I will refer her to Dr. Valli at [facility].    Tobacco/Marijuana/Snuff/ETOH use:   Signs/Symptoms Weight changes, if any:  Respiratory complaints, if any:  Hemoptysis, if any:  Pain issues, if any:    SAFETY ISSUES: Prior radiation?  Pacemaker/ICD?   Possible current pregnancy? Postmenopausal Is the patient on methotrexate?   Current Complaints / other details:

## 2024-05-05 NOTE — Pre-Procedure Assessment (Signed)
 [facility]-Perioperative Anesthesia [facility] Screening Clinic interview ALERTS:     Procedure:   Date/Time: TBD - 05/20/24 - not currently posted   Procedure: TBD Right video-assisted thoracoscopic segmentectomy   Location: TBD - [facility]    CC/HPI  Joan Owen is a 68 y.o. 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  Plan is for right video-assisted thoracoscopic segmentectomy of the lung.  Pt presents today for preoperative anesthesia and surgical evaluation prior to procedure.  Vitals (36hrs): Temp:  [36.4 C (97.6 F)-36.6 C (97.9 F)] 36.4 C (97.6 F) Heart Rate:  [69-80] 80 Resp:  [12-18] 18 BP: (110-120)/(66-67) 120/66 SpO2:  [98 %-99 %] 99 % Height:  [162 cm (5' 3.78)-162.6 cm (5' 4)] 162.6 cm (5' 4) Weight:  [67.7 kg (149 lb 4 oz)] 67.7 kg (149 lb 4 oz) BMI (Calculated):  [25.61-25.8] 25.61 Relevant Risk Scores: STOP BANG: 1 [facility] Activity Status Index (DASI): 46.2 Mini-COG: 5 Medical History                                    HEENT Reading glasses Dental implant Hx of TMJ  Pulmonary . Negative for URI PET 04/04/2024-hypermetabolic right upper lobe nodule, no other evidence of metastatic disease   Cardiovascular  .  Exercise tolerance: >4 METS (Can complete all ADLs, walk 1-2 blocks and up FOS, all household chores. Walks and does yoga daily, Body pump a few times a week. SABRA)  Denies cardiac symptoms including chest pain, DOE, orthopnea 10/30/22 ECG;  NSR Gastrointestinal .  GI malignancy: Colon, Liver hx of sigmod colon cancer s/p sigmoidectomy 04/29/21, liver lesion in 06/2022 s/p chemo (completed 02/21/23) s/p partial hepatectomy  11/14/22, now with RUL pulmonary nodule.    GU/Renal Normal as reviewed.   Neuro/Spine/CNS Normal as reviewed.   Hematology/Oncology  . negative for DVT negative for coagulation disorder. negative for history of blood transfusion cancer of the sigmoid  colon (s/p rectosigmoid resection 04/2021) with development of liver metastasis.  Right port  Rheumatology Normal as reviewed   Endocrine Normal as reviewed.  Musculoskeletal osteoporosis  Pain Denies pain Skin  . Skin cancer (BCC s/p excision. Followed by dermatology specialist annually.): GYN .  Post-menopausal  .   *H/o endometriosis No LMP recorded. Infectious Disease Normal as reviewed.   Psych Normal as reviewed.    Review of Systems   A comprehensive 10-system ROS was asked and was negative except for the following:  HEENT: contacts/glasses (Wears reading glasses) PULM: denies CARDIAC: denies   Past Surgical History   Past Surgical History:  Procedure Laterality Date  . CHOLECYSTECTOMY    . COLONOSCOPY    . FLAP MYOCUTANEOUS/FASCIOCUTANEOUS ABDOMEN N/A 11/14/2022   Procedure: MUSCLE, MYOCUTANEOUS, OR FASCIOCUTANEOUS FLAP; ABDOMEN;  Surgeon: Barbaraann Ozell Birmingham, MD;  Location: [facility];  Service: General Surgery;  Laterality: N/A;  . LAPAROSCOPY DIAGNOSTIC N/A 11/14/2022   Procedure: LAPAROSCOPY, SURGICAL; ABDOMEN, PERITONEUM, AND OMENTUM, DIAGNOSTIC, WITH OR WITHOUT COLLECTION OF SPECIMEN(S) BY BRUSHING OR WASHING (SEPARATE PROCEDURE);  Surgeon: Barbaraann Ozell Birmingham, MD;  Location: [facility] OR;  Service: General Surgery;  Laterality: N/A;  . RESECTION LIVER FOR PARTIAL LOBECTOMY N/A 11/14/2022   Procedure: HEPATECTOMY, RESECTION OF LIVER; PARTIAL LOBECTOMY;  Surgeon: Barbaraann Ozell Birmingham, MD;  Location: [facility] OR;  Service: General Surgery;  Laterality: N/A;  . ROBOTIC COLECTOMY PARTIAL  04/2021  . SIGMOIDOSCOPY FLEXIBLE  10/03/2022    Social/Family History   Social History   Occupational History  . Not on file  Tobacco Use  . Smoking status: Never  . Smokeless tobacco: Never  Vaping Use  . Vaping status: Never Used  Substance and Sexual Activity  . Alcohol use: Not Currently    Comment: stopped with recent dx  . Drug use: Never   . Sexual activity: Defer   Vaping/E-Cigarettes  . Vaping/E-Cigarette Use Never User   . Start Date    . Cartridges/Day    . Quit Date     Family History  Problem Relation Age of Onset  . Colon cancer Mother   . Coronary Artery Disease (Blocked arteries around heart) Father   . Anesthesia problems Neg Hx   . Malignant hypertension Neg Hx   . Malignant hyperthermia Neg Hx    Allergies  No Known Allergies Medications   Current Outpatient Medications  Medication  . docosahexaenoic acid/epa (FISH OIL ORAL)  . cholecalciferol (VITAMIN D3) 2,000 unit capsule  . Herbal Supplement  . ibuprofen (MOTRIN) 200 MG tablet  . multivitamin tablet   No current facility-administered medications for this visit.   Physical Exam  Airway/HEENT/Dental: Mallampati score: IV. Thyromental distance is >3 FB finger breadths. Mouth opening: Normal. Neck ROM is Full. Limited mouth opening but > 2 FB. Dental details: Normal. implant  Cardiovascular: Regular rate and rhythm. Intact distal pulses.  Pulmonary: Breath Sounds Clear. Respiratory effort: Normal.   Skin: Skin not assessed.  Musculoskeletal: Moves all extremities. Normal strength. Functional assessment: none/independent. Lower extremity edema not present.  Neuro: Oriented to person, place and time. Neuro grossly intact.  Abdominal: Bowel sounds are normal.   Constitutional: Well-developed, well-nourished, no distress.   Test Results    No results found for: WBC, HGB, HCT, PLT, CHOL, TRIG, HDL, LDLDIRECT, ALT, AST, GLUCOSE, NA, K, CL, CALCIUM , MG, CREATININE, BUN, CO2, ALB, TSH, PSA, HGBA1C, INR, PT, APTT, VITD         Patient Instructions  Medication instructions prior to procedure:    Medication Sig INSTRUCTIONS  . cholecalciferol (VITAMIN D3) 2,000 unit capsule Take 2,000 Units by mouth once daily DO NOT TAKE day of procedure  . docosahexaenoic acid/epa (FISH OIL ORAL) Take by  mouth STOP taking 7 days prior to procedure  . Herbal Supplement Herbal Name: Bone up with K2, Calcium , Vit D, Mag, Copper DO NOT TAKE day of procedure  . multivitamin tablet Take 1 tablet by mouth once daily STOP taking 7 days prior to procedure    Problem List   Patient Active Problem List  Diagnosis  . Preoperative evaluation to rule out surgical contraindication  . Colon cancer metastasized to liver (CMS/HHS-HCC)   Assessment & Plan  ASA: 3   Orders Placed This Encounter  Procedures  . Complete Blood Count (CBC)    Standing Status:   Future    Expected Date:   05/05/2024    Expiration Date:   09/04/2024    Release to patient:   Immediate  . Comprehensive Metabolic Panel (CMP)    Standing Status:   Future    Expected Date:   05/05/2024    Expiration Date:   09/04/2024    Release to patient:   Immediate  . ECG 12-lead  . Type And Screen    Standing Status:   Future    Expected Date:   05/05/2024    Expiration Date:   09/04/2024  Release to patient:   Immediate     Diagnoses and all orders for this visit:  Colon cancer metastasized to liver (CMS/HHS-HCC) -     Complete Blood Count (CBC); Future -     ECG 12-lead -     Comprehensive Metabolic Panel (CMP); Future -     Type And Screen; Future  Preoperative evaluation to rule out surgical contraindication -     Complete Blood Count (CBC); Future -     ECG 12-lead -     Comprehensive Metabolic Panel (CMP); Future -     Type And Screen; Future  Mass of lung -     Complete Blood Count (CBC); Future -     ECG 12-lead -     Comprehensive Metabolic Panel (CMP); Future -     Type And Screen; Future    Anesthesia considerations STOP_BANG: 1 PONV: No  Last Airway Note  Cardiovascular Risk Pt denies new-onset cardiovascular symptoms or prior cardiac history.  Normal ECG in 2024 Activity tolerance very good:      DASI: 46.2  Pulmonary Risk RUL lung mass ARISCAT Score for Postoperative Pulmonary Complications,  patient is at high risk of in-hospital post-op pulmonary complications for Intrathoracic surgery Pt denies recent URI, recent respiratory symptoms   Anticoagulation/Antiplatelet Plan N/A Frailty -Clinical Frailty Score 2. The CFS provides a score from 1 to 9 with higher scores indicating greater frailty and lower level of fitness.  Appropriateness of surgery location Surgery is appropriate for [facility]  Summary Labs and testing were reviewed as above.   Further medical assessment or optimization is not required to proceeding with procedure.  Action Items: Medication, activity and nutrition inductions discussed/provided via printoff Labs and ECG ordered.  Pt requesting T&S as she was under the impression she was O neg.  2022 ([facility]) and 2024 ([facility]) have A neg.    Overall Assessment The patient has no outstanding questions or concerns and was given the opportunity to ask questions. Instructions for day of procedure, contact information including number to call for arrival time, importance of adhering to NPO guidelines, patient verification, pain management, bathing instructions, and reducing alcohol and tobacco products if applicable. Patient verbalized understanding of instructions.  SUMMER STEPHANE RUBENSTEIN, FNP 05/05/2024    Day of Surgery  Day of Surgery Exam

## 2024-05-05 NOTE — Telephone Encounter (Signed)
 LVM for pt regarding her completed Disability forms. No questions or concerns to be noted.

## 2024-05-06 ENCOUNTER — Ambulatory Visit
Admission: RE | Admit: 2024-05-06 | Discharge: 2024-05-06 | Disposition: A | Discharge status: Home / Self Care | Source: Ambulatory Visit | Attending: Radiation Oncology | Admitting: Radiation Oncology

## 2024-05-06 ENCOUNTER — Telehealth: Payer: Self-pay

## 2024-05-06 ENCOUNTER — Encounter: Payer: Self-pay | Admitting: Radiation Oncology

## 2024-05-06 DIAGNOSIS — C3411 Malignant neoplasm of upper lobe, right bronchus or lung: Secondary | ICD-10-CM

## 2024-05-06 DIAGNOSIS — Z808 Family history of malignant neoplasm of other organs or systems: Secondary | ICD-10-CM | POA: Diagnosis not present

## 2024-05-06 DIAGNOSIS — Z79899 Other long term (current) drug therapy: Secondary | ICD-10-CM | POA: Diagnosis not present

## 2024-05-06 DIAGNOSIS — C7801 Secondary malignant neoplasm of right lung: Secondary | ICD-10-CM | POA: Diagnosis not present

## 2024-05-06 DIAGNOSIS — M810 Age-related osteoporosis without current pathological fracture: Secondary | ICD-10-CM | POA: Insufficient documentation

## 2024-05-06 DIAGNOSIS — C187 Malignant neoplasm of sigmoid colon: Secondary | ICD-10-CM | POA: Diagnosis not present

## 2024-05-06 DIAGNOSIS — Z860101 Personal history of adenomatous and serrated colon polyps: Secondary | ICD-10-CM | POA: Diagnosis not present

## 2024-05-06 DIAGNOSIS — Z85828 Personal history of other malignant neoplasm of skin: Secondary | ICD-10-CM | POA: Insufficient documentation

## 2024-05-06 NOTE — Telephone Encounter (Signed)
 Pt returned my phone call regarding her disability form. I sent an ROI form for her to sign.

## 2024-05-07 ENCOUNTER — Ambulatory Visit

## 2024-05-07 ENCOUNTER — Ambulatory Visit: Admitting: Radiation Oncology

## 2024-05-08 DIAGNOSIS — C3411 Malignant neoplasm of upper lobe, right bronchus or lung: Secondary | ICD-10-CM | POA: Diagnosis not present

## 2024-05-08 NOTE — Progress Notes (Signed)
 Radiation Oncology         [phone] ________________________________  Name: Joan Owen        MRN: [id number]  Date of Service: 05/06/2024 DOB: [birth year]  RR:Aobuy, Harlene LABOR, MD  Cloretta Arley NOVAK, MD     REFERRING PHYSICIAN: Cloretta Arley NOVAK, MD   DIAGNOSIS: The primary encounter diagnosis was Malignant neoplasm of right upper lobe of lung (HCC). A diagnosis of Cancer of sigmoid colon [facility]) was also pertinent to this visit.   HISTORY OF PRESENT ILLNESS: Joan Owen is a 68 y.o. female seen at the request of Dr. Cloretta for a history of recurrent metastatic colon carcinoma. The patient was originally diagnosed with stage II disease in 2022 and underwent sigmoidectomy.  She continued to follow-up with routine screening until 2023 when her CEA started to elevate and she was found by CT imaging in November 2023 to have a left hepatic lobe lesion.  An MRI on 07/16/2024 showed a 4.3 cm mass in the left hepatic lobe and she underwent neoadjuvant chemotherapy between December 2023 and March 2024 with subsequent partial hepatectomy on 11/14/2022.  She had a positive resection margin and was treated with intraoperative argon where tumor had been dissected off of a blood vessel.  She continued additional chemotherapy between 12/20/2022 and 03/07/2023.  No new disease was present and follow-up CT scans in August 2024 and she has continued to be followed with screening endoscopy procedures including sigmoidoscopy and CT scans.  A [facility] at [facility] on 07/03/2023 showed no evidence of metastatic disease in the abdomen or pelvis but identified a 4 mm right upper lobe nodule.  She has continued with surveillance of this and a CT chest abdomen pelvis on 03/24/2024 showed growth in this nodule measuring 8 mm in greatest dimension.  A PET scan on 04/04/2024 showed persistence of the right upper lobe nodule with an SUV of 5 and no additional disease noted in other sites.  Given that this is the only site of concern, she has been  counseled on the possibility of surgical resection and met with Dr. Valli at [facility] in the cardiothoracic surgery department.  She is conflicted about whether or not she should consider another invasive surgery as she has already been through 2 big cancer related surgeries.  That being said she is tentatively scheduled for posterior segmentectomy on 05/20/2024.  She is accompanied by her husband today to also discuss the possibility of stereotactic body radiotherapy (SBRT) as an alternative.    PREVIOUS RADIATION THERAPY: No   PAST MEDICAL HISTORY:  Past Medical History:  Diagnosis Date   Anemia    BCC (basal cell carcinoma), face 01/05/2015   Lip   Colon cancer (HCC) 04/29/2021   Colon polyps    hyperplastic   Endometriosis    Gallstones 1981   History of chicken pox    Osteopenia 01/05/2015   Osteoporosis    PONV (postoperative nausea and vomiting)    Right shoulder pain 01/05/2015       PAST SURGICAL HISTORY: Past Surgical History:  Procedure Laterality Date   BASAL CELL CARCINOMA EXCISION     upper lip   CHOLECYSTECTOMY  08/14/1978   COLECTOMY  04/29/2021   COLONOSCOPY     IR IMAGING GUIDED PORT INSERTION  07/31/2022   LAPAROSCOPY  08/15/1983   LIVER RESECTION       FAMILY HISTORY:  Family History  Problem Relation Age of Onset   Dementia Mother        Alzheimer  Alzheimer's disease Mother    Heart disease Father    Cancer Sister        skin   Dementia Sister    Alzheimer's disease Sister    Cancer Brother        skin   Thyroid  nodules Daughter    Colon cancer Neg Hx    Esophageal cancer Neg Hx    Rectal cancer Neg Hx    Stomach cancer Neg Hx    Colon polyps Neg Hx      SOCIAL HISTORY:  reports that she has never smoked. She has never used smokeless tobacco. She reports that she does not currently use alcohol after a past usage of about 2.0 standard drinks of alcohol per week. She reports that she does not use drugs.   ALLERGIES: Patient has no known  allergies.   MEDICATIONS:  Current Outpatient Medications  Medication Sig Dispense Refill   Cholecalciferol 50 MCG (2000 UT) CAPS Take 2,000 Units by mouth.     Multiple Vitamin (MULTI-VITAMIN) tablet Take 1 tablet by mouth daily.     Omega-3 Fatty Acids (FISH OIL) 300 MG CAPS Take by mouth.     No current facility-administered medications for this encounter.     REVIEW OF SYSTEMS: On review of systems, the patient reports that she is doing well but struggling to make a decision on what she thinks is the right approach to treat this right upper lobe nodule.  She is not opposed to surgical resection, but might be interested in less invasive approaches.  She states that she Is not having any unintended weight changes, shortness of breath, chest pain, productive cough or hemoptysis.  No other complaints are verbalized.    PHYSICAL EXAM:  Wt Readings from Last 3 Encounters:  05/06/24 (P) 149 lb 2 oz (67.6 kg)  04/10/24 154 lb 4.8 oz (70 kg)  03/25/24 155 lb (70.3 kg)   Temp Readings from Last 3 Encounters:  05/06/24 (!) (P) 97.3 F (36.3 C) ((P) Temporal)  04/10/24 97.7 F (36.5 C)  03/25/24 98.1 F (36.7 C) (Temporal)   BP Readings from Last 3 Encounters:  05/06/24 (P) 121/74  04/10/24 124/85  03/25/24 130/84   Pulse Readings from Last 3 Encounters:  05/06/24 (P) 71  04/10/24 80  03/25/24 84   Pain Assessment Pain Score: 0-No pain/10  In general this is a well appearing Caucasian female in no acute distress.  She's alert and oriented x4 and appropriate throughout the examination. Cardiopulmonary assessment is negative for acute distress and she exhibits normal effort.     ECOG = 0  0 - Asymptomatic (Fully active, able to carry on all predisease activities without restriction)  1 - Symptomatic but completely ambulatory (Restricted in physically strenuous activity but ambulatory and able to carry out work of a light or sedentary nature. For example, light housework,  office work)  2 - Symptomatic, <50% in bed during the day (Ambulatory and capable of all self care but unable to carry out any work activities. Up and about more than 50% of waking hours)  3 - Symptomatic, >50% in bed, but not bedbound (Capable of only limited self-care, confined to bed or chair 50% or more of waking hours)  4 - Bedbound (Completely disabled. Cannot carry on any self-care. Totally confined to bed or chair)  5 - Death   Raylene MM, Creech RH, Tormey DC, et al. [phone]). Toxicity and response criteria of the [facility]. Am. DOROTHA Bridges. Oncol.  5 (6): 649-55    LABORATORY DATA:  Lab Results  Component Value Date   WBC 4.3 10/01/2023   HGB 13.2 10/01/2023   HCT 38.7 10/01/2023   MCV 94.1 10/01/2023   PLT 220.0 10/01/2023   Lab Results  Component Value Date   NA 138 03/03/2024   K 4.2 03/03/2024   CL 103 03/03/2024   CO2 25 03/03/2024   Lab Results  Component Value Date   ALT 20 10/01/2023   AST 19 10/01/2023   ALKPHOS 69 10/01/2023   BILITOT 1.1 10/01/2023      RADIOGRAPHY: No results found.     IMPRESSION/PLAN: 1. 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  Discussed the findings from the recent imaging that the patient has had as well as her history to date. Dr. Dewey reviews that the standard of care is for surgical resection. However for patients who are not medical candidates to undergo surgery, or who choose to forgo surgery, stereotactic body radiotherapy (SBRT) is an appropriate alternative.  As the patient is a surgical candidate she is contemplating posterior segmentectomy for the right upper lobe. We discussed the risks, benefits, short, and long term effects of radiotherapy, as well as the curative intent, and the patient is interested in proceeding. Dr. Dewey discusses the delivery and logistics of radiotherapy and anticipates a course of 3-5 fractions of SBRT treatment.  She is hoping to have  additional discussion this week with Dr. Cloretta and request that we go ahead and plan a time for simulation on 05/19/2024 knowing that she will make a final decision in the next few days.  We will consent her at that time if she desires to proceed with SBRT.  In a visit lasting 75 minutes, greater than 50% of the time was spent face to face discussing the patient's condition, in preparation for the discussion, and coordinating the patient's care.   The above documentation reflects my direct findings during this shared patient visit. Please see the separate note by Dr. Dewey on this date for the remainder of the patient's plan of care.    Donald KYM Husband, [facility]   **Disclaimer: This note was dictated with voice recognition software. Similar sounding words can inadvertently be transcribed and this note may contain transcription errors which may not have been corrected upon publication of note.**

## 2024-05-12 ENCOUNTER — Encounter: Payer: Self-pay | Admitting: Radiation Oncology

## 2024-05-19 ENCOUNTER — Ambulatory Visit
Admission: RE | Admit: 2024-05-19 | Discharge: 2024-05-19 | Disposition: A | Discharge status: Home / Self Care | Source: Ambulatory Visit | Attending: Radiation Oncology | Admitting: Radiation Oncology

## 2024-05-19 DIAGNOSIS — C3411 Malignant neoplasm of upper lobe, right bronchus or lung: Secondary | ICD-10-CM | POA: Diagnosis not present

## 2024-05-19 DIAGNOSIS — C189 Malignant neoplasm of colon, unspecified: Secondary | ICD-10-CM | POA: Diagnosis not present

## 2024-05-19 DIAGNOSIS — Z510 Encounter for antineoplastic radiation therapy: Secondary | ICD-10-CM | POA: Diagnosis present

## 2024-05-19 DIAGNOSIS — C7801 Secondary malignant neoplasm of right lung: Secondary | ICD-10-CM | POA: Insufficient documentation

## 2024-05-21 ENCOUNTER — Ambulatory Visit: Admitting: Oncology

## 2024-05-21 ENCOUNTER — Other Ambulatory Visit

## 2024-05-21 ENCOUNTER — Encounter: Payer: Self-pay | Admitting: Family Medicine

## 2024-05-23 NOTE — Progress Notes (Signed)
   I, Claretha Schimke am a scribe for Dr. Artist Lloyd, MD.  Joan Owen is a 68 y.o. female who presents to [facility] at [facility] today for osteoporosis management. She works as an Network engineer. Family hx of osteoporosis.  DEXA scan (date, T-score): 04/21/24: Spine= -3.3, L-FN= -2.8, R-FN= -2.8 Prior treatment: no Bone Up supplement History of Hip, Spine, or Wrist Fx: no Heart disease or stroke: no Cancer: yes (colon, liver, lung) Kidney Disease: no Gastric/Peptic Ulcer: no Gastric bypass surgery: no Severe GERD: no Hx of seizures: no Age at Menopause: 68y/o Calcium  intake: yes Vitamin D  intake: yes Hormone replacement therapy: no Smoking history: never smoked Alcohol: not currently (wine on the weekends) Exercise: yes(walk. Yoga, and body pump) Major dental work in past year: yes- gum procedure(deep cleaning of gums) Parents with hip/spine fracture: no Height loss: 64 to 63.5   Pertinent review of systems: No fevers or chills  Relevant historical information: Cancer of the colon metastatic to the liver.  Scheduled to have radiation treatment to a lung nodule in the near future.   Exam:  BP 108/60   Pulse 77   Ht 5' 4 (1.626 m)   Wt 145 lb (65.8 kg)   LMP 03/28/2013   SpO2 98%   BMI 24.89 kg/m  General: Well Developed, well nourished, and in no acute distress.   MSK: Normal lumbar motion and gait.    Lab and Radiology Results  Last vitamin D  Lab Results  Component Value Date   VD25OH 39.03 03/12/2023     Chemistry      Component Value Date/Time   NA 138 03/03/2024 1005   K 4.2 03/03/2024 1005   CL 103 03/03/2024 1005   CO2 25 03/03/2024 1005   BUN 16 03/03/2024 1005   CREATININE 0.76 03/03/2024 1005      Component Value Date/Time   CALCIUM  9.7 03/03/2024 1005   ALKPHOS 69 10/01/2023 1238   AST 19 10/01/2023 1238   AST 20 03/07/2023 0909   ALT 20 10/01/2023 1238   ALT 16 03/07/2023 0909   BILITOT 1.1 10/01/2023 1238    BILITOT 0.5 03/07/2023 0909         Assessment and Plan: 68 y.o. female with osteoporosis with a T-score of -3.3.  This has been stable over the last several years.  Patient is taking vitamin D  and calcium  supplementation.  She is getting good high-quality weightbearing exercise and resistance training.  She is very much opposed to taking medications at this time.  We spent a lot of time talking about osteoporosis treatment strategies and ways to optimize.  Plan to continue current regimen of vitamin D  and calcium  and weightbearing exercise and body pump exercises.  If she is unable to exercise it with illness or if she has a fracture I think medications at that point would be a much stronger recommendation.  For now continue current regimen and recheck bone density in about 2 years.   PDMP not reviewed this encounter. No orders of the defined types were placed in this encounter.  No orders of the defined types were placed in this encounter.    Discussed warning signs or symptoms. Please see discharge instructions. Patient expresses understanding.   The above documentation has been reviewed and is accurate and complete Artist Lloyd, M.D.

## 2024-05-26 ENCOUNTER — Ambulatory Visit (INDEPENDENT_AMBULATORY_CARE_PROVIDER_SITE_OTHER): Admitting: Family Medicine

## 2024-05-26 VITALS — BP 108/60 | HR 77 | Ht 64.0 in | Wt 145.0 lb

## 2024-05-26 DIAGNOSIS — M810 Age-related osteoporosis without current pathological fracture: Secondary | ICD-10-CM | POA: Diagnosis not present

## 2024-05-26 NOTE — Patient Instructions (Signed)
 Thank you for coming in today. Continue Vitamin D . Continue exercises. Return as needed.

## 2024-05-27 ENCOUNTER — Encounter: Payer: Self-pay | Admitting: Oncology

## 2024-05-27 ENCOUNTER — Other Ambulatory Visit (HOSPITAL_BASED_OUTPATIENT_CLINIC_OR_DEPARTMENT_OTHER): Payer: Self-pay

## 2024-05-27 ENCOUNTER — Ambulatory Visit: Admitting: Family Medicine

## 2024-05-27 VITALS — BP 118/64 | HR 77 | Temp 98.0°F | Resp 16 | Ht 64.0 in | Wt 150.6 lb

## 2024-05-27 DIAGNOSIS — J189 Pneumonia, unspecified organism: Secondary | ICD-10-CM | POA: Diagnosis not present

## 2024-05-27 MED ORDER — DOXYCYCLINE HYCLATE 100 MG PO TABS
100.0000 mg | ORAL_TABLET | Freq: Two times a day (BID) | ORAL | 0 refills | Status: AC
  Filled 2024-05-27: qty 20, 10d supply, fill #0

## 2024-05-27 MED ORDER — PROMETHAZINE-DM 6.25-15 MG/5ML PO SYRP
5.0000 mL | ORAL_SOLUTION | Freq: Four times a day (QID) | ORAL | 0 refills | Status: AC | PRN
  Filled 2024-05-27: qty 118, 6d supply, fill #0

## 2024-05-27 NOTE — Patient Instructions (Addendum)
 Continue to push fluids, practice good hand hygiene, and cover your mouth if you cough.  If you start having fevers, shaking or shortness of breath, seek immediate care.  Do not drink alcohol, do any illicit/street drugs, drive or do anything that requires alertness while on this cough medicine.   Let us  know if you need anything.

## 2024-05-27 NOTE — Progress Notes (Signed)
 Chief Complaint  Patient presents with   Cough    Cough / Congestion    Joan Owen here for URI complaints.  Duration: 2 weeks  Associated symptoms: coughing Denies: sinus congestion, sinus pain, rhinorrhea, itchy watery eyes, ear pain, ear drainage, sore throat, wheezing, shortness of breath, myalgia, and fevers Treatment to date: Vit C, Mucinex, Zn, nasal rinse, green tea Sick contacts: Yes- spouse  Past Medical History:  Diagnosis Date   Anemia    BCC (basal cell carcinoma), face 01/05/2015   Lip   Colon cancer (HCC) 04/29/2021   Colon polyps    hyperplastic   Endometriosis    Gallstones 1981   History of chicken pox    Osteopenia 01/05/2015   Osteoporosis    PONV (postoperative nausea and vomiting)    Right shoulder pain 01/05/2015    Objective BP 118/64 (BP Location: Left Arm, Patient Position: Sitting)   Pulse 77   Temp 98 F (36.7 C) (Oral)   Resp 16   Ht 5' 4 (1.626 m)   Wt 150 lb 9.6 oz (68.3 kg)   LMP 03/28/2013   SpO2 98%   BMI 25.85 kg/m  General: Awake, alert, appears stated age HEENT: AT, [place], ears patent b/l and TM's neg, nares patent w/o discharge, pharynx pink and without exudates, MMM, no sinus TTP bilaterally Neck: No masses or asymmetry Heart: RRR Lungs: CTAB, no accessory muscle use Psych: Age appropriate judgment and insight, normal mood and affect  Walking pneumonia - Plan: doxycycline (VIBRA-TABS) 100 MG tablet, promethazine -dextromethorphan (PROMETHAZINE -DM) 6.25-15 MG/5ML syrup  Radiation treatment Monday.  Empirically treat for walking pneumonia with 7 to 10 days of doxycycline 100 mg twice daily.  Cough syrup as above.  Warned about drowsiness with this.  Continue to push fluids, practice good hand hygiene, cover mouth when coughing. F/u prn. If starting to experience fevers, shaking, or shortness of breath, seek immediate care. Pt voiced understanding and agreement to the plan.  Mabel [place], DO 05/27/24 2:37 PM

## 2024-05-28 ENCOUNTER — Telehealth: Payer: Self-pay | Admitting: *Deleted

## 2024-05-28 NOTE — Telephone Encounter (Signed)
 Spoke with the patient to let her know she could finish her course of Doxycycline and it would not interfere with her upcoming radiation treatments.  No other questions or concerns voiced.  Encouraged to call if anything arises.

## 2024-05-29 ENCOUNTER — Ambulatory Visit: Admitting: Student

## 2024-06-02 ENCOUNTER — Ambulatory Visit: Admitting: Radiation Oncology

## 2024-06-03 ENCOUNTER — Ambulatory Visit: Admitting: Radiation Oncology

## 2024-06-03 DIAGNOSIS — Z510 Encounter for antineoplastic radiation therapy: Secondary | ICD-10-CM | POA: Diagnosis not present

## 2024-06-04 ENCOUNTER — Other Ambulatory Visit: Payer: Self-pay

## 2024-06-04 ENCOUNTER — Ambulatory Visit
Admission: RE | Admit: 2024-06-04 | Discharge: 2024-06-04 | Disposition: A | Source: Ambulatory Visit | Attending: Radiation Oncology | Admitting: Radiation Oncology

## 2024-06-04 ENCOUNTER — Inpatient Hospital Stay

## 2024-06-04 ENCOUNTER — Other Ambulatory Visit: Payer: Self-pay | Admitting: *Deleted

## 2024-06-04 ENCOUNTER — Inpatient Hospital Stay: Attending: Oncology

## 2024-06-04 ENCOUNTER — Inpatient Hospital Stay (HOSPITAL_BASED_OUTPATIENT_CLINIC_OR_DEPARTMENT_OTHER): Admitting: Oncology

## 2024-06-04 VITALS — BP 113/69 | HR 72 | Temp 97.8°F | Resp 18 | Ht 64.0 in | Wt 149.7 lb

## 2024-06-04 DIAGNOSIS — C187 Malignant neoplasm of sigmoid colon: Secondary | ICD-10-CM | POA: Diagnosis not present

## 2024-06-04 DIAGNOSIS — Z23 Encounter for immunization: Secondary | ICD-10-CM | POA: Insufficient documentation

## 2024-06-04 DIAGNOSIS — Z510 Encounter for antineoplastic radiation therapy: Secondary | ICD-10-CM | POA: Diagnosis not present

## 2024-06-04 DIAGNOSIS — C787 Secondary malignant neoplasm of liver and intrahepatic bile duct: Secondary | ICD-10-CM | POA: Diagnosis not present

## 2024-06-04 DIAGNOSIS — R911 Solitary pulmonary nodule: Secondary | ICD-10-CM | POA: Diagnosis not present

## 2024-06-04 LAB — CEA (ACCESS): CEA (CHCC): 1.06 ng/mL (ref 0.00–5.00)

## 2024-06-04 LAB — RAD ONC ARIA SESSION SUMMARY
Course Elapsed Days: 0
Plan Fractions Treated to Date: 1
Plan Prescribed Dose Per Fraction: 18 Gy
Plan Total Fractions Prescribed: 3
Plan Total Prescribed Dose: 54 Gy
Reference Point Dosage Given to Date: 18 Gy
Reference Point Session Dosage Given: 18 Gy
Session Number: 1

## 2024-06-04 MED ORDER — INFLUENZA VAC SPLIT HIGH-DOSE 0.5 ML IM SUSY
0.5000 mL | PREFILLED_SYRINGE | Freq: Once | INTRAMUSCULAR | Status: AC
  Administered 2024-06-04: 0.5 mL via INTRAMUSCULAR
  Filled 2024-06-04: qty 0.5

## 2024-06-04 NOTE — Patient Instructions (Signed)

## 2024-06-04 NOTE — Progress Notes (Signed)
 [facility] OFFICE PROGRESS NOTE   Diagnosis: Colon cancer  INTERVAL HISTORY:   Ms. Gionfriddo returns as scheduled.  She is recovering from a recent upper respiratory infection with a cough and congestion.  She is completing a course of doxycycline.  She otherwise feels well. She saw Dr. Valli 05/05/2024.  Dr. Valli discussed resection of the lung nodule versus SBRT.  After meeting with Dr. Dewey Ms. Mohamed decided to proceed with SBRT.  She is scheduled to begin treatment today.  Objective:  Vital signs in last 24 hours:  Blood pressure 113/69, pulse 72, temperature 97.8 F (36.6 C), temperature source Temporal, resp. rate 18, height 5' 4 (1.626 m), weight 149 lb 11.2 oz (67.9 kg), last menstrual period 03/28/2013, SpO2 100%.   Lymphatics: No cervical, supraclavicular, axillary, or inguinal nodes Resp: Lungs clear bilaterally Cardio: Regular rate and rhythm GI: No hepatosplenomegaly, nontender, no mass Vascular: No leg edema   Portacath/PICC-without erythema  Lab Results:  Lab Results  Component Value Date   WBC 4.3 10/01/2023   HGB 13.2 10/01/2023   HCT 38.7 10/01/2023   MCV 94.1 10/01/2023   PLT 220.0 10/01/2023   NEUTROABS 2.6 10/01/2023    CMP  Lab Results  Component Value Date   NA 138 03/03/2024   K 4.2 03/03/2024   CL 103 03/03/2024   CO2 25 03/03/2024   GLUCOSE 103 (H) 03/03/2024   BUN 16 03/03/2024   CREATININE 0.76 03/03/2024   CALCIUM  9.7 03/03/2024   PROT 6.9 10/01/2023   ALBUMIN 4.4 10/01/2023   AST 19 10/01/2023   ALT 20 10/01/2023   ALKPHOS 69 10/01/2023   BILITOT 1.1 10/01/2023   GFRNONAA >60 03/03/2024    Lab Results  Component Value Date   CEA 1.29 03/03/2024    Medications: I have reviewed the patient's current medications.   Assessment/Plan:  Colon cancer, stage IIa (T3 N0 M0), sigmoidectomy 04/29/2021 Colonoscopy 03/24/2021-partially obstructing mass in the sigmoid colon, biopsy-adenocarcinoma CTs 03/28/2021-sigmoid  colon mass, prominent presacral and sigmoid mesentery lymph nodes-nonspecific, no evidence of distant metastatic disease, sclerotic lesions at the left intertrochanteric region-likely a bone island Sigmoidectomy 04/29/2021-sigmoid colon tumor, 0/16 lymph nodes, no lymphovascular or perineural invasion, no loss of mismatch repair protein expression, MSS Foundation 1-MSS, tumor mutation burden 6, K-ras and NRAS wild-type, ERBB2 amplification-equivocal HER2 2+, negative by FISH Colonoscopy 04/27/2022-polyps removed from the ascending and transverse colon-tubular adenomas 05/31/2022 CEA 8.54 06/28/2022 CEA 9.48 07/10/2022 CTs-new lesion central left hepatic lobe 07/16/2022 MRI 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 Seen by Dr. Barbaraann with recommendation for a 86-month course of chemotherapy prior to surgery Cycle 1 FOLFOX 08/09/2022 Cycle 2 FOLFOX 08/23/2022 Cycle 3 FOLFOX 09/06/2022 Cycle 4 FOLFOX 09/20/2022 Cycle 5 FOLFOX 10/04/2022 Cycle 6 FOLFOX 10/18/2022 Segment 4A/8 partial hepatectomy 11/14/2022-moderately differentiated adenocarcinoma compatible with a colorectal metastasis, 70% tumor necrosis, resection margin positive, R1 vascular margin per Dr. Barbaraann, tumor dissected off of the vessel treated with intraoperative argon Cycle 7 FOLFOX 12/20/2022 Cycle 8 FOLFOX 01/03/2023 Cycle 9 FOLFOX 01/24/2023-Emend  added Cycle 10 FOLFOX 02/08/2023, oxaliplatin  dose reduced due to mild neuropathy symptoms Cycle 11 FOLFOX 02/21/2023, oxaliplatin  held due to increased neuropathy symptoms and thrombocytopenia, 5-FU bolus held due to thrombocytopenia Cycle 12 FOLFOX 03/07/2023, oxaliplatin  held due to neuropathy CTs 03/19/2023-status post right hepatectomy, no new liver mass, asymmetric wall thickening proximal to the sigmoid anastomosis, new from 10/30/2022 Colonoscopy 03/22/2023-nodular and edematous mucosa in the sigmoid colon 5 cm proximal to the anastomosis, biopsy-ulcerated  colonic mucosa with  reactive epithelial changes, negative for malignancy, completed a course of antibiotics 05/02/2023-sigmoidoscopy, localized area of mildly nodular mucosa in the mid sigmoid improved, polyp removed from the descending colon, descending colon tubular adenoma, sigmoid polypoid mucosa-hyperplastic polyp, negative for dysplasia CTs at [facility] 07/03/2023: No evidence of metastatic disease, new 4 mm right upper lobe nodule favored inflammatory CTs 09/20/2023-right upper lobe pulmonary nodule measures 4 mm, previously measured 2 mm on CT 07/10/2022.  Prior partial right hepatectomy.  No suspicious liver lesion. CTs 12/31/2023-unchanged less than 5 mm right upper lobe nodule,, no evidence of metastatic disease CTs 03/24/2024-enlargement of right upper lobe nodule, 8 mm, no additional evidence of metastatic disease PET 04/04/2024-hypermetabolic right upper lobe nodule, no other evidence of metastatic disease SBRT to right lung nodule 06/04/2024   2.   Rectal bleeding, appointment with gastroenterology 09/25/2022-anal fissure per GI exam 09/11/2022.  Completed treatment for an anal fissure.  Flexible sigmoidoscopy 10/03/2022-moderate diverticulosis in the sigmoid colon and in the descending colon; patent end-to-side colocolonic anastomosis; anal papilla hypertrophied.  Rectal bleeding felt to be due to anal fissure.        Disposition: Ms. Mcwethy has metastatic colon cancer.  She is asymptomatic.  A staging PET scan 04/04/2024 revealed an isolated hypermetabolic right upper lobe nodule.  She considered the options of surgical resection and SBRT.  She decided to proceed with SBRT and will begin treatment today.  Ms. Schall will return for a Port-A-Cath flush in December.  She will be scheduled for restaging CTs and an office visit in January.  Arley Hof, MD  06/04/2024  10:16 AM

## 2024-06-05 ENCOUNTER — Ambulatory Visit: Admitting: Radiation Oncology

## 2024-06-05 ENCOUNTER — Other Ambulatory Visit: Payer: Self-pay

## 2024-06-06 ENCOUNTER — Ambulatory Visit
Admission: RE | Admit: 2024-06-06 | Discharge: 2024-06-06 | Disposition: A | Discharge status: Home / Self Care | Source: Ambulatory Visit | Attending: Radiation Oncology | Admitting: Radiation Oncology

## 2024-06-06 ENCOUNTER — Other Ambulatory Visit: Payer: Self-pay

## 2024-06-06 DIAGNOSIS — Z510 Encounter for antineoplastic radiation therapy: Secondary | ICD-10-CM | POA: Diagnosis not present

## 2024-06-06 DIAGNOSIS — C7801 Secondary malignant neoplasm of right lung: Secondary | ICD-10-CM | POA: Diagnosis not present

## 2024-06-06 DIAGNOSIS — C189 Malignant neoplasm of colon, unspecified: Secondary | ICD-10-CM | POA: Diagnosis not present

## 2024-06-06 LAB — RAD ONC ARIA SESSION SUMMARY
Course Elapsed Days: 2
Plan Fractions Treated to Date: 2
Plan Prescribed Dose Per Fraction: 18 Gy
Plan Total Fractions Prescribed: 3
Plan Total Prescribed Dose: 54 Gy
Reference Point Dosage Given to Date: 36 Gy
Reference Point Session Dosage Given: 18 Gy
Session Number: 2

## 2024-06-10 ENCOUNTER — Other Ambulatory Visit: Payer: Self-pay

## 2024-06-10 ENCOUNTER — Ambulatory Visit
Admission: RE | Admit: 2024-06-10 | Discharge: 2024-06-10 | Disposition: A | Source: Ambulatory Visit | Attending: Radiation Oncology | Admitting: Radiation Oncology

## 2024-06-10 DIAGNOSIS — C7801 Secondary malignant neoplasm of right lung: Secondary | ICD-10-CM | POA: Diagnosis not present

## 2024-06-10 DIAGNOSIS — C3411 Malignant neoplasm of upper lobe, right bronchus or lung: Secondary | ICD-10-CM | POA: Diagnosis not present

## 2024-06-10 DIAGNOSIS — C189 Malignant neoplasm of colon, unspecified: Secondary | ICD-10-CM | POA: Diagnosis not present

## 2024-06-10 DIAGNOSIS — Z510 Encounter for antineoplastic radiation therapy: Secondary | ICD-10-CM | POA: Diagnosis not present

## 2024-06-10 LAB — RAD ONC ARIA SESSION SUMMARY
Course Elapsed Days: 6
Plan Fractions Treated to Date: 3
Plan Prescribed Dose Per Fraction: 18 Gy
Plan Total Fractions Prescribed: 3
Plan Total Prescribed Dose: 54 Gy
Reference Point Dosage Given to Date: 54 Gy
Reference Point Session Dosage Given: 18 Gy
Session Number: 3

## 2024-06-11 NOTE — Radiation Completion Notes (Addendum)
  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 The patient tolerated radiation. She developed fatigue and anticipated skin changes in the treatment field.   The patient will receive a call in about one month from the radiation oncology department. She will continue follow up with Dr. Cloretta as well in surveillance.      Donald KYM Husband, PAC

## 2024-06-12 ENCOUNTER — Ambulatory Visit: Admitting: Radiation Oncology

## 2024-07-22 ENCOUNTER — Ambulatory Visit

## 2024-07-22 VITALS — Ht 64.0 in | Wt 149.0 lb

## 2024-07-22 DIAGNOSIS — Z0000 Encounter for general adult medical examination without abnormal findings: Secondary | ICD-10-CM

## 2024-07-22 NOTE — Patient Instructions (Addendum)
 Ms. Wherry,  Thank you for taking the time for your Medicare Wellness Visit. I appreciate your continued commitment to your health goals. Please review the care plan we discussed, and feel free to reach out if I can assist you further.  Please note that Annual Wellness Visits do not include a physical exam. Some assessments may be limited, especially if the visit was conducted virtually. If needed, we may recommend an in-person follow-up with your provider.  Ongoing Care Seeing your primary care provider every 3 to 6 months helps us  monitor your health and provide consistent, personalized care.   Referrals If a referral was made during today's visit and you haven't received any updates within two weeks, please contact the referred provider directly to check on the status.  Recommended Screenings:  Health Maintenance  Topic Date Due   Hepatitis C Screening  Never done   DTaP/Tdap/Td vaccine (2 - Td or Tdap) 01/04/2025   Colon Cancer Screening  03/21/2025   Medicare Annual Wellness Visit  07/22/2025   Breast Cancer Screening  04/21/2026   Pneumococcal Vaccine for age over 59  Completed   Flu Shot  Completed   Osteoporosis screening with Bone Density Scan  Completed   Meningitis B Vaccine  Aged Out   COVID-19 Vaccine  Discontinued   Zoster (Shingles) Vaccine  Discontinued       07/22/2024   10:11 AM  Advanced Directives  Does Patient Have a Medical Advance Directive? Yes  Type of Estate Agent of [place];Living will  Does patient want to make changes to medical advance directive? No - Patient declined  Copy of Healthcare Power of Attorney in Chart? Yes - validated most recent copy scanned in chart (See row information)    Vision: Annual vision screenings are recommended for early detection of glaucoma, cataracts, and diabetic retinopathy. These exams can also reveal signs of chronic conditions such as diabetes and high blood pressure.  Dental: Annual dental  screenings help detect early signs of oral cancer, gum disease, and other conditions linked to overall health, including heart disease and diabetes.  Please see the attached documents for additional preventive care recommendations.

## 2024-07-22 NOTE — Progress Notes (Signed)
 Chief Complaint  Patient presents with   Medicare Wellness     Subjective:   Joan Owen is a 68 y.o. female who presents for a Medicare Annual Wellness Visit.  Visit info / Clinical Intake: Medicare Wellness Visit Type:: Subsequent Annual Wellness Visit Persons participating in visit and providing information:: patient Medicare Wellness Visit Mode:: Telephone If telephone:: video declined Since this visit was completed virtually, some vitals may be partially provided or unavailable. Missing vitals are due to the limitations of the virtual format.: Documented vitals are patient reported If Telephone or Video please confirm:: I connected with patient using audio/video enable telemedicine. I verified patient identity with two identifiers, discussed telehealth limitations, and patient agreed to proceed. Patient Location:: Home Provider Location:: Office Interpreter Needed?: No Pre-visit prep was completed: yes AWV questionnaire completed by patient prior to visit?: yes Date:: 07/21/24 Living arrangements:: lives with spouse/significant other Patient's Overall Health Status Rating: excellent Typical amount of pain: none Does pain affect daily life?: no Are you currently prescribed opioids?: no  Dietary Habits and Nutritional Risks How many meals a day?: 3 Eats fruit and vegetables daily?: yes Most meals are obtained by: preparing own meals In the last 2 weeks, have you had any of the following?: none Diabetic:: no  Functional Status Activities of Daily Living (to include ambulation/medication): Independent Ambulation: Independent with device- listed below Home Assistive Devices/Equipment: Eyeglasses Medication Administration: Independent Home Management (perform basic housework or laundry): Independent Manage your own finances?: yes Primary transportation is: driving Concerns about vision?: no *vision screening is required for WTM* Concerns about hearing?: no  Fall  Screening Falls in the past year?: 0 Number of falls in past year: 0 Was there an injury with Fall?: 0 Fall Risk Category Calculator: 0 Patient Fall Risk Level: Low Fall Risk  Fall Risk Patient at Risk for Falls Due to: No Fall Risks Fall risk Follow up: Falls evaluation completed  Home and Transportation Safety: All rugs have non-skid backing?: yes All stairs or steps have railings?: yes Grab bars in the bathtub or shower?: (!) no Have non-skid surface in bathtub or shower?: yes Good home lighting?: yes Regular seat belt use?: yes Hospital stays in the last year:: no  Cognitive Assessment Difficulty concentrating, remembering, or making decisions? : no Will 6CIT or Mini Cog be Completed: no 6CIT or Mini Cog Declined: patient alert, oriented, able to answer questions appropriately and recall recent events  Advance Directives (For Healthcare) Does Patient Have a Medical Advance Directive?: Yes Does patient want to make changes to medical advance directive?: No - Patient declined Type of Advance Directive: Healthcare Power of [place]; Living will Copy of Healthcare Power of Attorney in Chart?: Yes - validated most recent copy scanned in chart (See row information) Copy of Living Will in Chart?: Yes - validated most recent copy scanned in chart (See row information) Would patient like information on creating a medical advance directive?: No - Patient declined  Reviewed/Updated  Reviewed/Updated: Reviewed All (Medical, Surgical, Family, Medications, Allergies, Care Teams, Patient Goals)    Allergies (verified) Patient has no known allergies.   Current Medications (verified) Outpatient Encounter Medications as of 07/22/2024  Medication Sig   Cholecalciferol 50 MCG (2000 UT) CAPS Take 2,000 Units by mouth.   doxycycline  (VIBRA -TABS) 100 MG tablet Take 1 tablet (100 mg total) by mouth 2 (two) times daily.   Multiple Vitamin (MULTI-VITAMIN) tablet Take 1 tablet by mouth daily.    Omega-3 Fatty Acids (FISH OIL) 300 MG CAPS Take  by mouth.   promethazine -dextromethorphan (PROMETHAZINE -DM) 6.25-15 MG/5ML syrup Take 5 mLs by mouth 4 (four) times daily as needed for cough. (Patient not taking: Reported on 06/04/2024)   No facility-administered encounter medications on file as of 07/22/2024.    History: Past Medical History:  Diagnosis Date   Anemia    BCC (basal cell carcinoma), face 01/05/2015   Lip   Colon cancer (HCC) 04/29/2021   Colon polyps    hyperplastic   Endometriosis    Gallstones 1981   History of chicken pox    Osteopenia 01/05/2015   Osteoporosis    PONV (postoperative nausea and vomiting)    Right shoulder pain 01/05/2015   Past Surgical History:  Procedure Laterality Date   BASAL CELL CARCINOMA EXCISION     upper lip   CHOLECYSTECTOMY  08/14/1978   COLECTOMY  04/29/2021   COLONOSCOPY     IR IMAGING GUIDED PORT INSERTION  07/31/2022   LAPAROSCOPY  08/15/1983   LIVER RESECTION     Family History  Problem Relation Age of Onset   Dementia Mother        Alzheimer   Alzheimer's disease Mother    Heart disease Father    Cancer Sister        skin   Dementia Sister    Alzheimer's disease Sister    Cancer Brother        skin   Thyroid  nodules Daughter    Colon cancer Neg Hx    Esophageal cancer Neg Hx    Rectal cancer Neg Hx    Stomach cancer Neg Hx    Colon polyps Neg Hx    Social History   Occupational History   Occupation: network engineer  Tobacco Use   Smoking status: Never   Smokeless tobacco: Never  Vaping Use   Vaping status: Never Used  Substance and Sexual Activity   Alcohol use: Not Currently    Alcohol/week: 2.0 standard drinks of alcohol    Types: 2 Glasses of wine per week   Drug use: No   Sexual activity: Not on file   Tobacco Counseling Counseling given: No  SDOH Screenings   Food Insecurity: No Food Insecurity (07/22/2024)  Housing: Unknown (07/22/2024)  Transportation Needs: No Transportation Needs  (07/22/2024)  Utilities: Not At Risk (07/22/2024)  Alcohol Screen: Low Risk  (05/27/2024)  Depression (PHQ2-9): Low Risk  (07/22/2024)  Financial Resource Strain: Low Risk  (05/27/2024)  Physical Activity: Sufficiently Active (07/22/2024)  Social Connections: Moderately Isolated (07/22/2024)  Stress: No Stress Concern Present (07/22/2024)  Tobacco Use: Low Risk  (07/22/2024)  Health Literacy: Adequate Health Literacy (07/22/2024)   See flowsheets for full screening details  Depression Screen PHQ 2 & 9 Depression Scale- Over the past 2 weeks, how often have you been bothered by any of the following problems? Little interest or pleasure in doing things: 0 Feeling down, depressed, or hopeless (PHQ Adolescent also includes...irritable): 0 PHQ-2 Total Score: 0 Trouble falling or staying asleep, or sleeping too much: 0 Feeling tired or having little energy: 0 Poor appetite or overeating (PHQ Adolescent also includes...weight loss): 0 Feeling bad about yourself - or that you are a failure or have let yourself or your family down: 0 Trouble concentrating on things, such as reading the newspaper or watching television (PHQ Adolescent also includes...like school work): 0 Moving or speaking so slowly that other people could have noticed. Or the opposite - being so fidgety or restless that you have been moving around a lot more  than usual: 0 Thoughts that you would be better off dead, or of hurting yourself in some way: 0 PHQ-9 Total Score: 0 If you checked off any problems, how difficult have these problems made it for you to do your work, take care of things at home, or get along with other people?: Not difficult at all     Goals Addressed               This Visit's Progress     Continue physical activity (pt-stated)        Remain active             Objective:    Today's Vitals   07/22/24 1010  Weight: 149 lb (67.6 kg)  Height: 5' 4 (1.626 m)   Body mass index is 25.58  kg/m.  Hearing/Vision screen Hearing Screening - Comments:: Denies hearing difficulties   Vision Screening - Comments:: Wears rx glasses - up to date with routine eye exams with  [facility] Immunizations and Health Maintenance Health Maintenance  Topic Date Due   Hepatitis C Screening  Never done   DTaP/Tdap/Td (2 - Td or Tdap) 01/04/2025   Colonoscopy  03/21/2025   Medicare Annual Wellness (AWV)  07/22/2025   Mammogram  04/21/2026   Pneumococcal Vaccine: 50+ Years  Completed   Influenza Vaccine  Completed   Bone Density Scan  Completed   Meningococcal B Vaccine  Aged Out   COVID-19 Vaccine  Discontinued   Zoster Vaccines- Shingrix  Discontinued        Assessment/Plan:  This is a routine wellness examination for Downey.  Patient Care Team: Domenica Harlene LABOR, MD as PCP - General (Family Medicine) Darina, Kayla, MD as Consulting Physician (Gynecology) Obie Princella HERO, MD (Inactive) as Consulting Physician (Gastroenterology) Junior Emmit Ivanoff, MD as Referring Physician (Dermatology) Nori Sari SQUIBB, RN as Oncology Nurse Navigator  I have personally reviewed and noted the following in the patient's chart:   Medical and social history Use of alcohol, tobacco or illicit drugs  Current medications and supplements including opioid prescriptions. Functional ability and status Nutritional status Physical activity Advanced directives List of other physicians Hospitalizations, surgeries, and ER visits in previous 12 months Vitals Screenings to include cognitive, depression, and falls Referrals and appointments  No orders of the defined types were placed in this encounter.  In addition, I have reviewed and discussed with patient certain preventive protocols, quality metrics, and best practice recommendations. A written personalized care plan for preventive services as well as general preventive health recommendations were provided to patient.   Rojelio LELON Blush,  LPN   [id number]   Return in 1 year on 07/29/25  After Visit Summary: (MyChart) Due to this being a telephonic visit, the after visit summary with patients personalized plan was offered to patient via MyChart   Nurse Notes: None

## 2024-07-23 ENCOUNTER — Other Ambulatory Visit: Payer: Self-pay

## 2024-08-01 ENCOUNTER — Telehealth: Payer: Self-pay | Admitting: *Deleted

## 2024-08-01 NOTE — Telephone Encounter (Signed)
 LVM for Joan Owen with port flush/lab on 1/26 at 11:00 and then check in at [facility] radiology at 1130 to start oral contrast for her CT that will start at 1:30 pm. NPO 4 hours prior per radiology. Also sent message on MyChart

## 2024-09-03 ENCOUNTER — Encounter: Payer: Self-pay | Admitting: Oncology

## 2024-09-05 ENCOUNTER — Telehealth: Payer: Self-pay | Admitting: Oncology

## 2024-09-05 NOTE — Telephone Encounter (Signed)
 Rescheduled appointments per incoming call from the patient. Patient has requested to reschedule due to the incoming inclement weather. Talked with the patient and she is aware of the changes made to her upcoming appointments.

## 2024-09-08 ENCOUNTER — Encounter: Payer: Self-pay | Admitting: Oncology

## 2024-09-08 ENCOUNTER — Ambulatory Visit (HOSPITAL_BASED_OUTPATIENT_CLINIC_OR_DEPARTMENT_OTHER)

## 2024-09-08 ENCOUNTER — Inpatient Hospital Stay

## 2024-09-10 ENCOUNTER — Inpatient Hospital Stay: Admitting: Oncology

## 2024-09-10 ENCOUNTER — Inpatient Hospital Stay: Admitting: Nurse Practitioner

## 2024-09-12 ENCOUNTER — Encounter: Payer: Self-pay | Admitting: Oncology

## 2024-09-14 ENCOUNTER — Telehealth: Payer: Self-pay

## 2024-09-14 NOTE — Telephone Encounter (Signed)
 Monday 09/15/24 flush appointment canceled. Patient is aware and will call to reschedule.

## 2024-09-15 ENCOUNTER — Ambulatory Visit (HOSPITAL_BASED_OUTPATIENT_CLINIC_OR_DEPARTMENT_OTHER)

## 2024-09-15 ENCOUNTER — Inpatient Hospital Stay

## 2024-09-17 ENCOUNTER — Telehealth: Payer: Self-pay | Admitting: Nurse Practitioner

## 2024-09-17 NOTE — Telephone Encounter (Signed)
 PT called to reschedule port access appt and scan review; day and time confirmed.

## 2024-09-18 ENCOUNTER — Inpatient Hospital Stay: Admitting: Nurse Practitioner

## 2024-09-19 ENCOUNTER — Encounter: Payer: Self-pay | Admitting: Oncology

## 2024-09-22 ENCOUNTER — Inpatient Hospital Stay

## 2024-09-22 ENCOUNTER — Ambulatory Visit (HOSPITAL_BASED_OUTPATIENT_CLINIC_OR_DEPARTMENT_OTHER)

## 2024-09-24 ENCOUNTER — Inpatient Hospital Stay: Admitting: Nurse Practitioner

## 2025-07-29 ENCOUNTER — Ambulatory Visit
